# Patient Record
Sex: Female | Born: 1970
Health system: Southern US, Community
[De-identification: ages and names within clinical notes are randomized; demographics above are authoritative.]

## PROBLEM LIST (undated history)

## (undated) DIAGNOSIS — G43909 Migraine, unspecified, not intractable, without status migrainosus: Secondary | ICD-10-CM

## (undated) DIAGNOSIS — Z9889 Other specified postprocedural states: Secondary | ICD-10-CM

## (undated) DIAGNOSIS — K219 Gastro-esophageal reflux disease without esophagitis: Secondary | ICD-10-CM

## (undated) DIAGNOSIS — S43109A Unspecified dislocation of unspecified acromioclavicular joint, initial encounter: Secondary | ICD-10-CM

## (undated) DIAGNOSIS — T8859XA Other complications of anesthesia, initial encounter: Secondary | ICD-10-CM

## (undated) DIAGNOSIS — F419 Anxiety disorder, unspecified: Secondary | ICD-10-CM

## (undated) DIAGNOSIS — G5601 Carpal tunnel syndrome, right upper limb: Secondary | ICD-10-CM

## (undated) DIAGNOSIS — T4145XA Adverse effect of unspecified anesthetic, initial encounter: Secondary | ICD-10-CM

## (undated) DIAGNOSIS — G47419 Narcolepsy without cataplexy: Secondary | ICD-10-CM

## (undated) DIAGNOSIS — R112 Nausea with vomiting, unspecified: Secondary | ICD-10-CM

## (undated) DIAGNOSIS — Z98811 Dental restoration status: Secondary | ICD-10-CM

## (undated) HISTORY — PX: BUNIONECTOMY: SHX129

---

## 1998-05-08 ENCOUNTER — Emergency Department (HOSPITAL_COMMUNITY): Admission: EM | Admit: 1998-05-08 | Discharge: 1998-05-08 | Payer: Self-pay | Admitting: Emergency Medicine

## 1998-05-08 ENCOUNTER — Encounter: Payer: Self-pay | Admitting: Emergency Medicine

## 2002-03-31 ENCOUNTER — Ambulatory Visit (HOSPITAL_COMMUNITY): Admission: RE | Admit: 2002-03-31 | Discharge: 2002-03-31 | Payer: Self-pay | Admitting: Obstetrics and Gynecology

## 2002-03-31 ENCOUNTER — Encounter: Payer: Self-pay | Admitting: Obstetrics and Gynecology

## 2002-09-08 ENCOUNTER — Inpatient Hospital Stay (HOSPITAL_COMMUNITY): Admission: RE | Admit: 2002-09-08 | Discharge: 2002-09-08 | Payer: Self-pay | Admitting: Obstetrics and Gynecology

## 2002-09-08 ENCOUNTER — Encounter: Payer: Self-pay | Admitting: Obstetrics and Gynecology

## 2002-10-09 ENCOUNTER — Inpatient Hospital Stay (HOSPITAL_COMMUNITY): Admission: AD | Admit: 2002-10-09 | Discharge: 2002-10-09 | Payer: Self-pay | Admitting: Obstetrics and Gynecology

## 2002-10-31 ENCOUNTER — Inpatient Hospital Stay (HOSPITAL_COMMUNITY): Admission: AD | Admit: 2002-10-31 | Discharge: 2002-10-31 | Payer: Self-pay | Admitting: Obstetrics and Gynecology

## 2002-10-31 ENCOUNTER — Encounter: Payer: Self-pay | Admitting: Obstetrics and Gynecology

## 2002-11-14 ENCOUNTER — Ambulatory Visit (HOSPITAL_COMMUNITY): Admission: RE | Admit: 2002-11-14 | Discharge: 2002-11-14 | Payer: Self-pay | Admitting: Obstetrics and Gynecology

## 2002-11-14 ENCOUNTER — Encounter (INDEPENDENT_AMBULATORY_CARE_PROVIDER_SITE_OTHER): Payer: Self-pay

## 2002-11-14 HISTORY — PX: LAPAROSCOPIC OVARIAN CYSTECTOMY: SHX6248

## 2002-11-14 HISTORY — PX: ENDOMETRIAL ABLATION: SHX621

## 2003-04-03 ENCOUNTER — Other Ambulatory Visit: Admission: RE | Admit: 2003-04-03 | Discharge: 2003-04-03 | Payer: Self-pay | Admitting: Obstetrics and Gynecology

## 2004-09-16 ENCOUNTER — Ambulatory Visit: Payer: Self-pay | Admitting: Internal Medicine

## 2004-09-22 ENCOUNTER — Ambulatory Visit: Payer: Self-pay | Admitting: Internal Medicine

## 2004-11-01 ENCOUNTER — Other Ambulatory Visit: Admission: RE | Admit: 2004-11-01 | Discharge: 2004-11-01 | Payer: Self-pay | Admitting: Gynecology

## 2004-11-15 ENCOUNTER — Ambulatory Visit: Payer: Self-pay | Admitting: Internal Medicine

## 2004-11-19 ENCOUNTER — Emergency Department (HOSPITAL_COMMUNITY): Admission: EM | Admit: 2004-11-19 | Discharge: 2004-11-19 | Payer: Self-pay | Admitting: Family Medicine

## 2005-03-24 ENCOUNTER — Ambulatory Visit: Payer: Self-pay | Admitting: Internal Medicine

## 2005-10-12 ENCOUNTER — Inpatient Hospital Stay (HOSPITAL_COMMUNITY): Admission: AD | Admit: 2005-10-12 | Discharge: 2005-10-17 | Payer: Self-pay | Admitting: Obstetrics and Gynecology

## 2005-10-14 ENCOUNTER — Encounter (INDEPENDENT_AMBULATORY_CARE_PROVIDER_SITE_OTHER): Payer: Self-pay | Admitting: Specialist

## 2005-11-28 ENCOUNTER — Other Ambulatory Visit: Admission: RE | Admit: 2005-11-28 | Discharge: 2005-11-28 | Payer: Self-pay | Admitting: Obstetrics and Gynecology

## 2006-02-27 ENCOUNTER — Ambulatory Visit: Payer: Self-pay | Admitting: Internal Medicine

## 2006-06-28 ENCOUNTER — Ambulatory Visit: Payer: Self-pay | Admitting: Internal Medicine

## 2006-07-31 ENCOUNTER — Ambulatory Visit: Payer: Self-pay | Admitting: Internal Medicine

## 2006-09-25 ENCOUNTER — Ambulatory Visit: Payer: Self-pay | Admitting: Internal Medicine

## 2006-10-30 ENCOUNTER — Ambulatory Visit: Payer: Self-pay | Admitting: Internal Medicine

## 2007-07-24 ENCOUNTER — Ambulatory Visit: Payer: Self-pay | Admitting: Internal Medicine

## 2007-08-23 ENCOUNTER — Encounter (INDEPENDENT_AMBULATORY_CARE_PROVIDER_SITE_OTHER): Payer: Self-pay | Admitting: Obstetrics and Gynecology

## 2007-08-23 ENCOUNTER — Inpatient Hospital Stay (HOSPITAL_COMMUNITY): Admission: RE | Admit: 2007-08-23 | Discharge: 2007-08-26 | Payer: Self-pay | Admitting: Obstetrics and Gynecology

## 2007-08-23 HISTORY — PX: TUBAL LIGATION: SHX77

## 2007-11-04 ENCOUNTER — Ambulatory Visit: Payer: Self-pay | Admitting: Internal Medicine

## 2007-11-12 ENCOUNTER — Telehealth: Payer: Self-pay | Admitting: Internal Medicine

## 2007-12-09 ENCOUNTER — Ambulatory Visit: Payer: Self-pay | Admitting: Internal Medicine

## 2007-12-25 ENCOUNTER — Telehealth: Payer: Self-pay | Admitting: Internal Medicine

## 2008-04-03 ENCOUNTER — Ambulatory Visit: Payer: Self-pay | Admitting: Internal Medicine

## 2008-04-03 DIAGNOSIS — F341 Dysthymic disorder: Secondary | ICD-10-CM | POA: Insufficient documentation

## 2008-04-17 ENCOUNTER — Telehealth: Payer: Self-pay | Admitting: Internal Medicine

## 2008-09-11 ENCOUNTER — Ambulatory Visit: Payer: Self-pay | Admitting: Internal Medicine

## 2008-09-21 ENCOUNTER — Encounter: Payer: Self-pay | Admitting: Internal Medicine

## 2008-10-04 ENCOUNTER — Emergency Department (HOSPITAL_COMMUNITY): Admission: EM | Admit: 2008-10-04 | Discharge: 2008-10-04 | Payer: Self-pay | Admitting: Emergency Medicine

## 2008-10-06 ENCOUNTER — Ambulatory Visit: Payer: Self-pay | Admitting: Internal Medicine

## 2008-10-23 ENCOUNTER — Telehealth: Payer: Self-pay | Admitting: Internal Medicine

## 2008-10-26 ENCOUNTER — Telehealth: Payer: Self-pay | Admitting: Internal Medicine

## 2008-10-26 ENCOUNTER — Ambulatory Visit: Payer: Self-pay | Admitting: Internal Medicine

## 2008-10-27 ENCOUNTER — Ambulatory Visit: Payer: Self-pay | Admitting: Internal Medicine

## 2009-02-04 ENCOUNTER — Telehealth: Payer: Self-pay | Admitting: Internal Medicine

## 2009-03-10 ENCOUNTER — Telehealth: Payer: Self-pay | Admitting: Internal Medicine

## 2009-04-20 ENCOUNTER — Ambulatory Visit: Payer: Self-pay | Admitting: Family Medicine

## 2009-04-28 ENCOUNTER — Telehealth: Payer: Self-pay | Admitting: Family Medicine

## 2009-04-30 ENCOUNTER — Ambulatory Visit (HOSPITAL_COMMUNITY): Admission: RE | Admit: 2009-04-30 | Discharge: 2009-04-30 | Payer: Self-pay | Admitting: Obstetrics and Gynecology

## 2009-04-30 HISTORY — PX: LAPAROSCOPIC LYSIS OF ADHESIONS: SHX5905

## 2009-05-18 ENCOUNTER — Telehealth: Payer: Self-pay | Admitting: Internal Medicine

## 2009-05-24 ENCOUNTER — Ambulatory Visit: Payer: Self-pay | Admitting: Internal Medicine

## 2009-05-31 ENCOUNTER — Telehealth: Payer: Self-pay | Admitting: Internal Medicine

## 2009-06-04 ENCOUNTER — Telehealth: Payer: Self-pay | Admitting: Internal Medicine

## 2009-07-22 ENCOUNTER — Telehealth: Payer: Self-pay | Admitting: Internal Medicine

## 2009-07-26 ENCOUNTER — Ambulatory Visit (HOSPITAL_COMMUNITY): Admission: RE | Admit: 2009-07-26 | Discharge: 2009-07-27 | Payer: Self-pay | Admitting: Obstetrics and Gynecology

## 2009-07-26 HISTORY — PX: LAPAROSCOPIC UNILATERAL SALPINGO OOPHERECTOMY: SHX5935

## 2009-07-26 HISTORY — PX: CYSTOSCOPY: SHX5120

## 2009-07-26 HISTORY — PX: LAPAROSCOPIC SUPRACERVICAL HYSTERECTOMY: SUR797

## 2009-09-06 ENCOUNTER — Telehealth: Payer: Self-pay | Admitting: Internal Medicine

## 2009-10-20 ENCOUNTER — Telehealth: Payer: Self-pay | Admitting: Internal Medicine

## 2009-12-21 ENCOUNTER — Ambulatory Visit: Payer: Self-pay | Admitting: Family Medicine

## 2010-03-18 ENCOUNTER — Ambulatory Visit: Payer: Self-pay | Admitting: Internal Medicine

## 2010-03-18 DIAGNOSIS — R109 Unspecified abdominal pain: Secondary | ICD-10-CM | POA: Insufficient documentation

## 2010-03-21 LAB — CONVERTED CEMR LAB
ALT: 33 units/L (ref 0–35)
AST: 23 units/L (ref 0–37)
Albumin: 4.1 g/dL (ref 3.5–5.2)
Alkaline Phosphatase: 62 units/L (ref 39–117)
Amylase: 40 units/L (ref 27–131)
Basophils Absolute: 0 10*3/uL (ref 0.0–0.1)
Basophils Relative: 0.5 % (ref 0.0–3.0)
Bilirubin, Direct: 0.1 mg/dL (ref 0.0–0.3)
Eosinophils Absolute: 0.1 10*3/uL (ref 0.0–0.7)
Eosinophils Relative: 1 % (ref 0.0–5.0)
HCT: 34.3 % — ABNORMAL LOW (ref 36.0–46.0)
Hemoglobin: 11.8 g/dL — ABNORMAL LOW (ref 12.0–15.0)
Lymphocytes Relative: 34.5 % (ref 12.0–46.0)
Lymphs Abs: 1.8 10*3/uL (ref 0.7–4.0)
MCHC: 34.5 g/dL (ref 30.0–36.0)
MCV: 92.3 fL (ref 78.0–100.0)
Monocytes Absolute: 0.5 10*3/uL (ref 0.1–1.0)
Monocytes Relative: 9.2 % (ref 3.0–12.0)
Neutro Abs: 2.9 10*3/uL (ref 1.4–7.7)
Neutrophils Relative %: 54.8 % (ref 43.0–77.0)
Platelets: 191 10*3/uL (ref 150.0–400.0)
RBC: 3.71 M/uL — ABNORMAL LOW (ref 3.87–5.11)
RDW: 12.2 % (ref 11.5–14.6)
Total Bilirubin: 0.5 mg/dL (ref 0.3–1.2)
Total Protein: 7.5 g/dL (ref 6.0–8.3)
WBC: 5.3 10*3/uL (ref 4.5–10.5)

## 2010-03-22 ENCOUNTER — Telehealth: Payer: Self-pay | Admitting: Internal Medicine

## 2010-03-25 ENCOUNTER — Encounter: Admission: RE | Admit: 2010-03-25 | Discharge: 2010-03-25 | Payer: Self-pay | Admitting: Internal Medicine

## 2010-03-29 ENCOUNTER — Encounter: Payer: Self-pay | Admitting: Internal Medicine

## 2010-05-13 ENCOUNTER — Ambulatory Visit: Payer: Self-pay | Admitting: Internal Medicine

## 2010-05-13 DIAGNOSIS — K649 Unspecified hemorrhoids: Secondary | ICD-10-CM | POA: Insufficient documentation

## 2010-05-13 DIAGNOSIS — R1011 Right upper quadrant pain: Secondary | ICD-10-CM | POA: Insufficient documentation

## 2010-05-13 DIAGNOSIS — R110 Nausea: Secondary | ICD-10-CM | POA: Insufficient documentation

## 2010-05-30 ENCOUNTER — Telehealth: Payer: Self-pay | Admitting: Internal Medicine

## 2010-05-30 ENCOUNTER — Ambulatory Visit (HOSPITAL_COMMUNITY): Admission: RE | Admit: 2010-05-30 | Discharge: 2010-05-30 | Payer: Self-pay | Admitting: Internal Medicine

## 2010-05-31 ENCOUNTER — Encounter (INDEPENDENT_AMBULATORY_CARE_PROVIDER_SITE_OTHER): Payer: Self-pay | Admitting: *Deleted

## 2010-06-14 ENCOUNTER — Ambulatory Visit: Payer: Self-pay | Admitting: Internal Medicine

## 2010-09-13 NOTE — Procedures (Signed)
Summary: Upper Endoscopy  Patient: Cynthia [name] Note: All result statuses are Final unless otherwise noted.  Tests: (1) Upper Endoscopy (EGD)   EGD Upper Endoscopy       DONE     [facility]     [address]     [place], [place]  [address]           ENDOSCOPY PROCEDURE REPORT           PATIENT:  Cynthia, [name]  MR#:  [id number]     BIRTHDATE:  January 28, 1971, 38 yrs. old  GENDER:  female           ENDOSCOPIST:  Iva Boop, MD, [facility]     Referred by:          Birdie Sons, MD           PROCEDURE DATE:  06/14/2010     PROCEDURE:  EGD, diagnostic 43329     ASA CLASS:  Class I     INDICATIONS:  abdominal pain, right upper quad, nausea Gallbladder     work-up negative and she has not responded to PPI.     Hyoscyamine is helping but "makes me loopy"           MEDICATIONS:   Fentanyl 50 mcg IV, Versed 7 mg IV     TOPICAL ANESTHETIC:  Exactacain Spray           DESCRIPTION OF PROCEDURE:   After the risks benefits and     alternatives of the procedure were thoroughly explained, informed     consent was obtained.  The [facility] GIF-H180 [id number] endoscope was     introduced through the mouth and advanced to the second portion of     the duodenum, without limitations.  The instrument was slowly     withdrawn as the mucosa was fully examined.     <<PROCEDUREIMAGES>>           The upper, middle, and distal third of the esophagus were     carefully inspected and no abnormalities were noted. The z-line     was well seen at the GEJ. The endoscope was pushed into the fundus     which was normal including a retroflexed view. The antrum,gastric     body, first and second part of the duodenum were unremarkable.     Retroflexed views revealed no abnormalities.    The scope was then     withdrawn from the patient and the procedure completed.           COMPLICATIONS:  None           ENDOSCOPIC IMPRESSION:     1) Normal EGD     RECOMMENDATIONS:     1) Stop hyoscyamine     2) start  amitriptylline 25 mg, 1/2 tab at bedtime for 1 week     then 1 tab at bedtime     3) Call Dr. Marvell Fuller office this week to make an appointment to     be seen in 6 weeks           REPEAT EXAM:  In for as needed.           Iva Boop, MD, Clementeen Graham           CC:  The Patient     Lindley Magnus, MD           n.     Rosalie DoctorMaryjean Morn.  Gessner at 06/14/2010 05:24 PM           Sander Radon, [id number]  Note: An exclamation mark (!) indicates a result that was not dispersed into the flowsheet. Document Creation Date: 06/14/2010 5:25 PM _______________________________________________________________________  (1) Order result status: Final Collection or observation date-time: 06/14/2010 17:06 Requested date-time:  Receipt date-time:  Reported date-time:  Referring Physician:   Ordering Physician: Stan Head [phone]) Specimen Source:  Source: Launa Grill Order Number: [id number] Lab site:

## 2010-09-13 NOTE — Miscellaneous (Signed)
Summary: amitriptylline rx, stop Dexilant and hyoscyamine  Clinical Lists Changes  Medications: Removed medication of HYOSCYAMINE SULFATE CR 0.375 MG  TB12 (HYOSCYAMINE SULFATE) 1 by mouth two times a day Removed medication of DEXILANT 60 MG CPDR (DEXLANSOPRAZOLE) Take 1 capsule by mouth once a day Removed medication of HYOSCYAMINE SULFATE 0.125 MG SUBL (HYOSCYAMINE SULFATE) 1-2 as needed abdominal pain Added new medication of AMITRIPTYLINE HCL 25 MG TABS (AMITRIPTYLINE HCL) 1/2 tab at bedtime for 1 week then 1 tab by mouth at bedtime - Signed Rx of AMITRIPTYLINE HCL 25 MG TABS (AMITRIPTYLINE HCL) 1/2 tab at bedtime for 1 week then 1 tab by mouth at bedtime;  #30 x 1;  Signed;  Entered by: Iva Boop MD, Clementeen Graham;  Authorized by: Iva Boop MD, [facility];  Method used: Electronically to [facility]  [phone]*, [address] [place], [place], [place]  [address], Ph: [phone] or [phone], Fax: [phone]    Prescriptions: AMITRIPTYLINE HCL 25 MG TABS (AMITRIPTYLINE HCL) 1/2 tab at bedtime for 1 week then 1 tab by mouth at bedtime  #30 x 1   Entered and Authorized by:   Iva Boop MD, [facility]   Signed by:   Iva Boop MD, FACG on 06/14/2010   Method used:   Electronically to        [facility]  [phone]* (retail)       [address]       [place], [place]  [address]       Ph: [phone] or [phone]       Fax: [phone]   RxID:   [id number]

## 2010-09-13 NOTE — Assessment & Plan Note (Signed)
Summary: ABD PAIN/YF   History of Present Illness Visit Type: Initial Consult Primary GI MD: Stan Head MD [facility] Primary Provider: Birdie Sons, MD Requesting Provider: Birdie Sons, MD Chief Complaint: Starting 2 months ago pt has severe nausea after meals and RUQ under rib cage. Pt does have bleeding hemorrhiods sometimes without constipation or diarrhea. History of Present Illness:   [place] neg If she eats anything or even drinks coffee gets nauseous, and then gets pain in RUQ that radiates into back and infrascapular region. Never before. Nausea s constant. she took Nexium x almost 1 month without help. Gaviscon helsp some but does not stop it. Not sleeping well with these problems. Was on Solodyn x 1 year and about 1 month ago switched to Bactrim. Two young kids at home so busy and stressed. husband in school fulltime and working  Hemorrhoids since childbirth 4 yrs ago and has irregular defecation and intermttent bleeding bright red.   GI Review of Systems    Reports abdominal pain, acid reflux, belching, bloating, chest pain, and  nausea.     Location of  Abdominal pain: RUQ.    Denies dysphagia with liquids, dysphagia with solids, heartburn, loss of appetite, vomiting, vomiting blood, weight loss, and  weight gain.      Reports rectal bleeding.     Denies anal fissure, black tarry stools, change in bowel habit, constipation, diarrhea, diverticulosis, fecal incontinence, heme positive stool, hemorrhoids, irritable bowel syndrome, jaundice, light color stool, liver problems, and  rectal pain. Preventive Screening-Counseling & Management      Drug Use:  no.      Current Medications (verified): 1)  Tylenol 325 Mg  Tabs (Acetaminophen) .... As Needed 2)  Tylenol Pm Extra Strength 500-25 Mg Tabs (Diphenhydramine-Apap (Sleep)) .... Take 1 Tablet By Mouth At Bedtime 3)  Citalopram Hydrobromide 20 Mg Tabs (Citalopram Hydrobromide) .... Once Daily 4)  Xanax 0.25 Mg Tabs (Alprazolam)  .... One By Mouth Q O Day Not To Exceed 3 Per Week 5)  Hydrocodone-Acetaminophen 5-325 Mg Tabs (Hydrocodone-Acetaminophen) .Marland Kitchen.. 1 By Mouth Up To 4 Times Per Day As Needed For Pain 6)  Bactrim 400-80 Mg Tabs (Sulfamethoxazole-Trimethoprim) .... One Tablet By Mouth Once Daily  Allergies (verified): 1)  ! 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  The patient denies allergy/sinus, anemia, arthritis/joint pain, blood in urine, breast changes/lumps, change in vision, confusion, cough, coughing up blood, depression-new, fainting, fever, hearing problems, heart murmur, heart rhythm changes, itching, muscle pains/cramps, night sweats, nosebleeds, pregnancy symptoms, shortness of breath, skin rash, sleeping problems, sore throat, swelling of feet/legs, swollen lymph glands, thirst - excessive , urination - excessive , urination changes/pain, vision changes, and voice change.    Vital Signs:  Patient profile:   40 year old female Height:      61 inches Weight:      132.13 pounds Pulse rate:  78 / minute Pulse rhythm:   regular BP sitting:   106 / 74  (right arm) Cuff size:   regular  Vitals Entered By: Christie Nottingham CMA Duncan Dull) (May 13, 2010 1:42 PM)  Physical Exam  General:  Well developed, well nourished, no acute  distress. Eyes:   no icterus. Mouth:  No deformity or lesions, dentition normal. Neck:  Supple; no masses or thyromegaly. Lungs:  Clear throughout to auscultation. Heart:  Regular rate and rhythm; no murmurs, rubs,  or bruits. Abdomen:  mildly tender RUQ soft, no HSM, Mass BS+ Rectal:  female staff present small external tagm firm brown stoolm no mass, normal tone ANOSCOPY: hemorhoids with red spots in anal canal Extremities:  No clubbing, cyanosis, edema or deformities noted. Cervical Nodes:  No significant cervical or supraclavicular adenopathy.  Psych:  Alert and cooperative. Normal mood and affect.   Impression & Recommendations:  Problem # 1:  ABDOMINAL PAIN, RIGHT UPPER QUADRANT (ICD-789.01) Assessment New sounds like Gallbladder but [place] without stones (does not exclude)  Orders: HIDA CCK (HIDA CCK) ? Gallbladder dyskinesia  Problem # 2:  NAUSEA (ICD-787.02) Assessment: New constant not better with PPI try Dexilant now ? Solodyn effect - have seen in past but would suspect it would be gone as she has not taken that in 1 month  Orders: HIDA CCK (HIDA CCK)  Problem # 3:  HEMORRHOIDS, WITH BLEEDING (ICD-455.8) Assessment: New naucort HC as needed  Problem # 4:  ? of IRRITABLE BOWEL SYNDROME (ICD-564.1) Assessment: New  Patient Instructions: 1)  Please pick up your medications at your pharmacy. 2)  Your HIDA scan is scheduled at [facility].  Please see seperate instructions. 3)  Dexilant samples given.  Please call our office if this works. 4)  The medication list was reviewed and reconciled.  All changed / newly prescribed medications were explained.  A complete medication list was provided to the patient / caregiver. Prescriptions: HYOSCYAMINE SULFATE 0.125 MG SUBL (HYOSCYAMINE SULFATE) 1-2 as needed abdominal pain  #90 x 0   Entered and Authorized by:   Iva Boop MD, [facility]   Signed by:   Iva Boop MD, FACG on 05/13/2010   Method used:    Electronically to        [facility]  [phone]* (retail)       [address]       [place], [place]  [address]       Ph: [phone] or [phone]       Fax: [phone]   RxID:   [id number] ANUCORT-HC 25 MG SUPP (HYDROCORTISONE ACETATE) 1 per rectum as needed for rectal bleeding  #28 x 0   Entered and Authorized by:   Iva Boop MD, [facility]   Signed by:   Iva Boop MD, FACG on 05/13/2010   Method used:   Electronically to        [facility]  [phone]* (retail)       [address]       [place], [place]  [address]       Ph: [phone] or [phone]       Fax: [phone]   RxID:   [id number]

## 2010-09-13 NOTE — Progress Notes (Signed)
Summary: [place] results  Phone Note Call from Patient   Caller: Patient Call For: Birdie Sons MD Summary of Call: Pt is asking for GB Ultra Sound results. [phone] Initial call taken by: Lynann Beaver CMA,  March 22, 2010 9:15 AM  Follow-up for Phone Call        results not available as yet.  Will call when are ready. Left message on phone to inform pt. Follow-up by: Gladis Riffle, RN,  March 22, 2010 2:37 PM  Additional Follow-up for Phone Call Additional follow up Details #1::        scheduled 8/12 at 7:30.Left message on pt home phone. Additional Follow-up by: Gladis Riffle, RN,  March 23, 2010 10:13 AM

## 2010-09-13 NOTE — Progress Notes (Signed)
Summary: xanax request  Phone Note Call from Patient   Caller: Patient Call For: Birdie Sons MD Summary of Call: Pt is requesting Xanax for anxiety and multiple issues she is dealing with right now. Celexa is not helping a lot. [phone]  Initial call taken by: Lynann Beaver CMA,  September 06, 2009 3:37 PM  Follow-up for Phone Call        alprazolam 0.25 mg by mouth every other day as needed anxiety not to exceed 3 per week #12/1 Follow-up by: Birdie Sons MD,  September 06, 2009 3:51 PM    New/Updated Medications: XANAX 0.25 MG TABS (ALPRAZOLAM) one by mouth q o day not to exceed 3 per week Prescriptions: XANAX 0.25 MG TABS (ALPRAZOLAM) one by mouth q o day not to exceed 3 per week  #12 x 1   Entered by:   Lynann Beaver CMA   Authorized by:   Birdie Sons MD   Signed by:   Lynann Beaver CMA on 09/06/2009   Method used:   Telephoned to ...       [facility]  [address]  [phone]* (retail)       [address]       [place], [place]  [address]       Ph: [phone] or [phone]       Fax: [phone]   RxID:   [id number]

## 2010-09-13 NOTE — Assessment & Plan Note (Signed)
Summary: COUGH, CONGESTION, EAR PAIN // RS   Vital Signs:  Patient profile:   40 year old female Temp:     98 degrees F oral BP sitting:   98 / 70  (left arm) Cuff size:   regular  Vitals Entered By: Sid Falcon LPN (Dec 21, 2009 9:28 AM) CC: cough, congestion, sinus headache X 3 days   History of Present Illness: Patient is seen with one week history of frontal sinus headaches, bilateral ear pain, cough and thick yellow nasal discharge. Intermittent sore throat. No fever. Mild to moderate body aches. No dyspnea. Taken over-the-counter medications with minimal relief. Nonsmoker  Allergies: 1)  ! Morphine 2)  Periostat (Doxycycline Hyclate) 3)  Anaprox (Naproxen Sodium) 4)  Meclizine Hcl (Meclizine Hcl)  Past History:  Past Medical History: Last updated: 11/04/2007 Unremarkable  Social History: Last updated: 04/03/2008 Never Smoked maried 2 kids PMH reviewed for relevance, SH/Risk Factors reviewed for relevance  Review of Systems      See HPI  Physical Exam  General:  Well-developed,well-nourished,in no acute distress; alert,appropriate and cooperative throughout examination Ears:  External ear exam shows no significant lesions or deformities.  Otoscopic examination reveals clear canals, tympanic membranes are intact bilaterally without bulging, retraction, inflammation or discharge. Hearing is grossly normal bilaterally. Nose:  swollen nasal mucosa with mostly clear mucus Mouth:  Oral mucosa and oropharynx without lesions or exudates.  Teeth in good repair. Neck:  No deformities, masses, or tenderness noted. Lungs:  Normal respiratory effort, chest expands symmetrically. Lungs are clear to auscultation, no crackles or wheezes. Heart:  Normal rate and regular rhythm. S1 and S2 normal without gallop, murmur, click, rub or other extra sounds.   Impression & Recommendations:  Problem # 1:  URI (ICD-465.9) prob viral .   Pt is encouraged to wait and no antibiotics at  this time.  If colored nasal d/c and facial pain persist start Amoxicillin. Her updated medication list for this problem includes:    Tylenol 325 Mg Tabs (Acetaminophen) .Marland Kitchen... As needed  Complete Medication List: 1)  Tylenol 325 Mg Tabs (Acetaminophen) .... As needed 2)  Tylenol Pm Extra Strength 500-25 Mg Tabs (Diphenhydramine-apap (sleep)) .... Take 1 tablet by mouth at bedtime 3)  Citalopram Hydrobromide 20 Mg Tabs (Citalopram hydrobromide) .... Once daily 4)  Soldyn  .... Take 1 tablet by mouth at bedtime for face 5)  Amoxicillin 500 Mg Caps (Amoxicillin) .... One by mouth three times a day x 10 days 6)  Diflucan 150 Mg Tabs (Fluconazole) .... Take as a one time dose. 7)  Imipramine Hcl 10 Mg Tabs (Imipramine hcl) .... One by mouth q hs 8)  Xanax 0.25 Mg Tabs (Alprazolam) .... One by mouth q o day not to exceed 3 per week  Patient Instructions: 1)  Get plenty of rest, drink lots of clear liquids, and use Tylenol or Ibuprofen for fever and comfort. Return in 7-10 days if you're not better: sooner if you'er feeling worse.  2)  Consider starting antibiotics if facial pain and colored nasal discharge not improving over the next few days. Prescriptions: DIFLUCAN 150 MG TABS (FLUCONAZOLE) Take as a one time dose.  #1 x 0   Entered and Authorized by:   Evelena Peat MD   Signed by:   Evelena Peat MD on 12/21/2009   Method used:   Print then Give to Patient   RxID:   [id number] AMOXICILLIN 500 MG CAPS (AMOXICILLIN) one by mouth three times a day x 10  days  #30 x 0   Entered and Authorized by:   Evelena Peat MD   Signed by:   Evelena Peat MD on 12/21/2009   Method used:   Print then Give to Patient   RxID:   [id number]

## 2010-09-13 NOTE — Letter (Signed)
Summary: EGD Instructions  [facility]  [address] [place], [place] [address]   Phone: [phone]  Fax: [phone]       STEPHANY POORMAN    [birth year]    MRN: [id number]       Procedure Day Dorna Bloom: Jake Shark, 06/14/10     Arrival Time: 3:00 PM     Procedure Time: 4:00 PM     Location of Procedure:                    _X_  [facility] Center (4th Floor)  PREPARATION FOR ENDOSCOPY   On TUESDAY, 06/14/10, THE DAY OF THE PROCEDURE:  1.   No solid foods, milk or milk products are allowed after midnight the night before your procedure.  2.   Do not drink anything colored red or purple.  Avoid juices with pulp.  No orange juice.  3.  You may drink clear liquids until 2:00 PM, which is 2 hours before your procedure.                                                                                                CLEAR LIQUIDS INCLUDE: Water Jello Ice Popsicles Tea (sugar ok, no milk/cream) Powdered fruit flavored drinks Coffee (sugar ok, no milk/cream) Gatorade Juice: apple, white grape, white cranberry  Lemonade Clear bullion, consomm, broth Carbonated beverages (any kind) Strained chicken noodle soup Hard Candy   MEDICATION INSTRUCTIONS  Unless otherwise instructed, you should take regular prescription medications with a small sip of water as early as possible the morning of your procedure.                  OTHER INSTRUCTIONS  You will need a responsible adult at least 38 years of age to accompany you and drive you home.   This person must remain in the waiting room during your procedure.  Wear loose fitting clothing that is easily removed.  Leave jewelry and other valuables at home.  However, you may wish to bring a book to read or an iPod/MP3 player to listen to music as you wait for your procedure to start.  Remove all body piercing jewelry and leave at home.  Total time from sign-in until discharge is approximately 2-3 hours.  You  should go home directly after your procedure and rest.  You can resume normal activities the day after your procedure.  The day of your procedure you should not:   Drive   Make legal decisions   Operate machinery   Drink alcohol   Return to work  You will receive specific instructions about eating, activities and medications before you leave.    The above instructions have been reviewed and explained to me by   Francee Piccolo, CMA (AAMA)     I fully understand and can verbalize these instructions _____________________________ Date 05/31/10

## 2010-09-13 NOTE — Progress Notes (Signed)
Summary: Normal HIDA and sx reassessment  Phone Note Outgoing Call Call back at [facility] Phone [phone]   Summary of Call: HIDA is ok how is she? Iva Boop MD, [facility]  May 30, 2010 3:00 PM   Follow-up for Phone Call        very nauseated all the time, still having pain under right ribs.  Took last of Dexilant, but this was not helping.  Hyoscyamine helps pain.  Nausea is more prevalent than pain.  Pt is controlling nausea with food intake (or lack there of).  Pt mentioned that you thought next step would be EGD.  Please advise. Francee Piccolo CMA Duncan Dull)  May 30, 2010 4:56 PM   Additional Follow-up for Phone Call Additional follow up Details #1::        schedule EGD to eval the abd pain I also rxed hyoscyamine 0.375 mg two times a day (long acting) could still take 0.125 mg as needed occasionally if the hyoscyamine fixes everything she should call back before EGD as may not need Additional Follow-up by: Iva Boop MD, Clementeen Graham,  May 31, 2010 8:23 AM    Additional Follow-up for Phone Call Additional follow up Details #2::    Appt for EGD shceduled.  Pt will come by tomorrow for paperwork.  She will pick up new rx at pharmacy. Follow-up by: Francee Piccolo CMA Duncan Dull),  May 31, 2010 8:46 AM  New/Updated Medications: HYOSCYAMINE SULFATE CR 0.375 MG  TB12 (HYOSCYAMINE SULFATE) 1 by mouth two times a day Prescriptions: HYOSCYAMINE SULFATE CR 0.375 MG  TB12 (HYOSCYAMINE SULFATE) 1 by mouth two times a day  #60 x 1   Entered and Authorized by:   Iva Boop MD, [facility]   Signed by:   Iva Boop MD, FACG on 05/31/2010   Method used:   Electronically to        [facility]  [phone]* (retail)       [address]       [place], [place]  [address]       Ph: [phone] or [phone]       Fax: [phone]   RxID:   [id number]

## 2010-09-13 NOTE — Progress Notes (Signed)
Summary: celexa & xanax refills  Phone Note Call from Patient Call back at Home Phone [phone]   Reason for Call: Refill Medication Summary of Call: Celexa & Xanax.  [facility].  Meds say doctor has to authorize. Initial call taken by: Rudy Jew, RN,  October 20, 2009 3:15 PM  Follow-up for Phone Call        can refill Follow-up by: Birdie Sons MD,  October 21, 2009 3:45 PM    Prescriptions: CITALOPRAM HYDROBROMIDE 20 MG TABS (CITALOPRAM HYDROBROMIDE) once daily  #90 x 3   Entered by:   Lynann Beaver CMA   Authorized by:   Gladis Riffle, RN   Signed by:   Lynann Beaver CMA on 10/22/2009   Method used:   Telephoned to ...       [facility]  [address]  [phone]* (retail)       [address]       [place], [place]  [address]       Ph: [phone] or [phone]       Fax: [phone]   RxID:   [id number] XANAX 0.25 MG TABS (ALPRAZOLAM) one by mouth q o day not to exceed 3 per week  #12 x 1   Entered by:   Lynann Beaver CMA   Authorized by:   Gladis Riffle, RN   Signed by:   Lynann Beaver CMA on 10/22/2009   Method used:   Telephoned to ...       [facility]  [address]  [phone]* (retail)       [address]       [place], [place]  [address]       Ph: [phone] or [phone]       Fax: [phone]   RxID:   [id number]

## 2010-09-13 NOTE — Assessment & Plan Note (Signed)
Summary: consult re: nausea when eating and pain under rib cage/cjr   Vital Signs:  Patient profile:   40 year old female Height:      61 inches Weight:      126 pounds BMI:     23.89 Temp:     98.2 degrees F oral Pulse rate:   72 / minute Resp:     14 per minute BP sitting:   100 / 90  (left arm)  Vitals Entered By: Willy Eddy, LPN (March 18, 2010 12:45 PM) CC: c/o pain under rt ribs radiating around to back with mnausea, gas and much indigestion   CC:  c/o pain under rt ribs radiating around to back with mnausea and gas and much indigestion.  History of Present Illness: several month hx of progressive postprandial RUQ pain that can radiate to back.  pain is intermittent but can be severe associated with nausea, no emesis Admits to worsening heartburn  All other systems reviewed and were negative   Preventive Screening-Counseling & Management  Alcohol-Tobacco     Alcohol drinks/day: 0     Smoking Status: quit > 6 months     Year Quit: 2006  Current Problems (verified): 1)  Dysthymic Disorder  (ICD-300.4)  Current Medications (verified): 1)  Tylenol 325 Mg  Tabs (Acetaminophen) .... As Needed 2)  Tylenol Pm Extra Strength 500-25 Mg Tabs (Diphenhydramine-Apap (Sleep)) .... Take 1 Tablet By Mouth At Bedtime 3)  Citalopram Hydrobromide 20 Mg Tabs (Citalopram Hydrobromide) .... Once Daily 4)  Soldyn .... Take 1 Tablet By Mouth At Bedtime For Face 5)  Xanax 0.25 Mg Tabs (Alprazolam) .... One By Mouth Q O Day Not To Exceed 3 Per Week  Allergies (verified): 1)  ! Morphine 2)  Periostat (Doxycycline Hyclate) 3)  Anaprox (Naproxen Sodium) 4)  Meclizine Hcl (Meclizine Hcl)  Past History:  Past Medical History: Last updated: 11/04/2007 Unremarkable  Past Surgical History: Last updated: 06/28/2006 laparoscopy bunionectomy  Family History: Last updated: 04/03/2008 hx of depression  Social History: Last updated: 04/03/2008 Never Smoked maried 2  kids  Risk Factors: Alcohol Use: 0 (03/18/2010) Caffeine Use: 1 (10/26/2008) Exercise: no (10/26/2008)  Risk Factors: Smoking Status: quit > 6 months (03/18/2010)  Physical Exam  General:  alert and well-developed.   Head:  normocephalic and atraumatic.   Eyes:  pupils equal and pupils round.   Ears:  R ear normal and L ear normal.   Neck:  No deformities, masses, or tenderness noted. Lungs:  Normal respiratory effort, chest expands symmetrically. Lungs are clear to auscultation, no crackles or wheezes. Heart:  normal rate and regular rhythm.   Abdomen:  soft, normal bowel sounds, no distention, no masses, and no guarding.   Neurologic:  cranial nerves II-XII intact and gait normal.     Impression & Recommendations:  Problem # 1:  ABDOMINAL PAIN (ICD-789.00) unclear etiology. Biliary colic is high on the list. Needs aggressive further evaluation. Check laboratory work and ultrasound. We will be in touch with patient after results. Orders: Venipuncture ([address]) Radiology Referral (Radiology) TLB-Hepatic/Liver Function Pnl (80076-HEPATIC) TLB-CBC Platelet - w/Differential (85025-CBCD) TLB-Amylase (82150-AMYL)  Complete Medication List: 1)  Tylenol 325 Mg Tabs (Acetaminophen) .... As needed 2)  Tylenol Pm Extra Strength 500-25 Mg Tabs (Diphenhydramine-apap (sleep)) .... Take 1 tablet by mouth at bedtime 3)  Citalopram Hydrobromide 20 Mg Tabs (Citalopram hydrobromide) .... Once daily 4)  Soldyn  .... Take 1 tablet by mouth at bedtime for face 5)  Xanax 0.25  Mg Tabs (Alprazolam) .... One by mouth q o day not to exceed 3 per week 6)  Hydrocodone-acetaminophen 5-325 Mg Tabs (Hydrocodone-acetaminophen) .Marland Kitchen.. 1 by mouth up to 4 times per day as needed for pain Prescriptions: HYDROCODONE-ACETAMINOPHEN 5-325 MG TABS (HYDROCODONE-ACETAMINOPHEN) 1 by mouth up to 4 times per day as needed for pain  #20 x 1   Entered and Authorized by:   Birdie Sons MD   Signed by:   Birdie Sons MD on  03/18/2010   Method used:   Print then Give to Patient   RxID:   [id number]

## 2010-09-13 NOTE — Letter (Signed)
Summary: New Patient letter  [facility]  [address] [place], [place] [address]   Phone: [phone]  Fax: [phone]       03/29/2010 MRN: [id number]  [facility] [address] [place], [place]  [address]  Dear Ms. Plaza,  Welcome to the Gastroenterology Division at [facility].    You are scheduled to see Dr.  Leone Payor on 05-13-10 at 1:45pm on the 3rd floor at [facility], [address]  We ask that you try to arrive at our office 15 minutes prior to your appointment time to allow for check-in.  We would like you to complete the enclosed self-administered evaluation form prior to your visit and bring it with you on the day of your appointment.  We will review it with you.  Also, please bring a complete list of all your medications or, if you prefer, bring the medication bottles and we will list them.  Please bring your insurance card so that we may make a copy of it.  If your insurance requires a referral to see a specialist, please bring your referral form from your primary care physician.  Co-payments are due at the time of your visit and may be paid by cash, check or credit card.     Your office visit will consist of a consult with your physician (includes a physical exam), any laboratory testing he/she may order, scheduling of any necessary diagnostic testing (e.g. x-ray, ultrasound, CT-scan), and scheduling of a procedure (e.g. Endoscopy, Colonoscopy) if required.  Please allow enough time on your schedule to allow for any/all of these possibilities.    If you cannot keep your appointment, please call [phone] to cancel or reschedule prior to your appointment date.  This allows [place] the opportunity to schedule an appointment for another patient in need of care.  If you do not cancel or reschedule by 5 p.m. the business day prior to your appointment date, you will be charged a $50.00 late cancellation/no-show fee.    Thank you for choosing  [facility] for your medical needs.  We appreciate the opportunity to care for you.  Please visit [place] at our website  to learn more about our practice.                     Sincerely,                                                             The Gastroenterology Division

## 2010-09-17 IMAGING — CT CT HEAD W/O CM
1 series · 16 of 30 positions shown, 20 images · non-contrast
Comparison: None

CLINICAL DATA: Recent trauma to the head.  Headache and dizziness.

CT HEAD WITHOUT CONTRAST
TECHNIQUE: Contiguous axial images were obtained from the base of
the skull through the vertex without contrast.

[Series 2: head_seq 4.5 h37s st · axial · 0.43mm/px · z∈[-136,-10]mm · 16 of 32 slices shown, 20 images]
[im 2/32  brain]
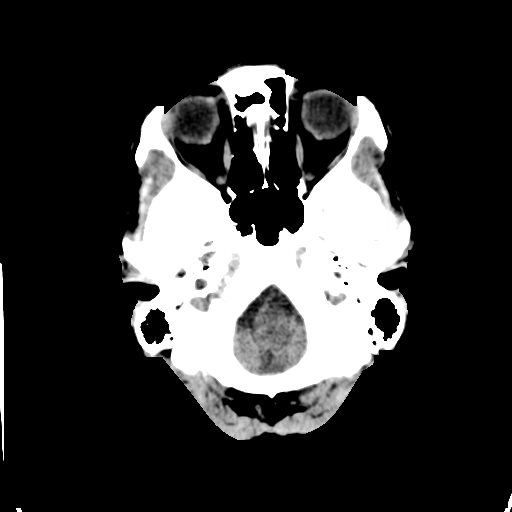
[im 2/32  bone]
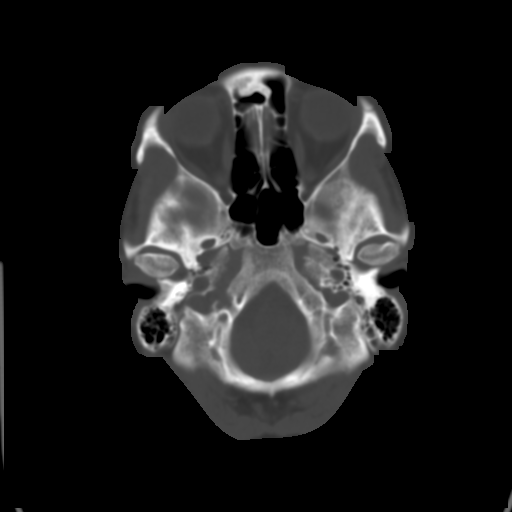
[im 4/32  brain]
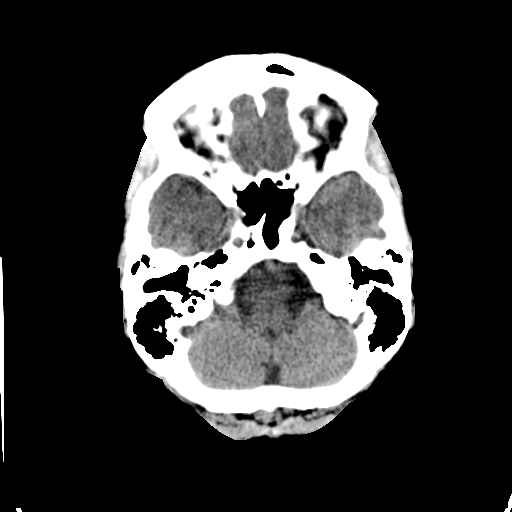
[im 6/32  brain]
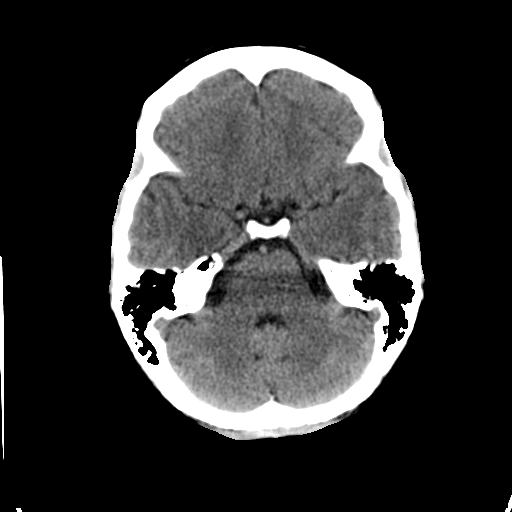
[im 8/32  brain]
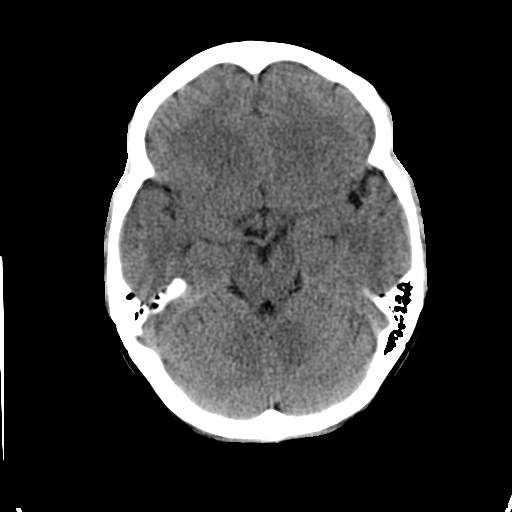
[im 9/32  brain]
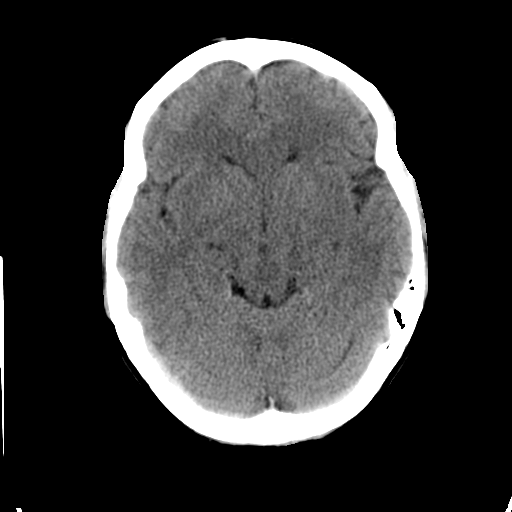
[im 9/32  bone]
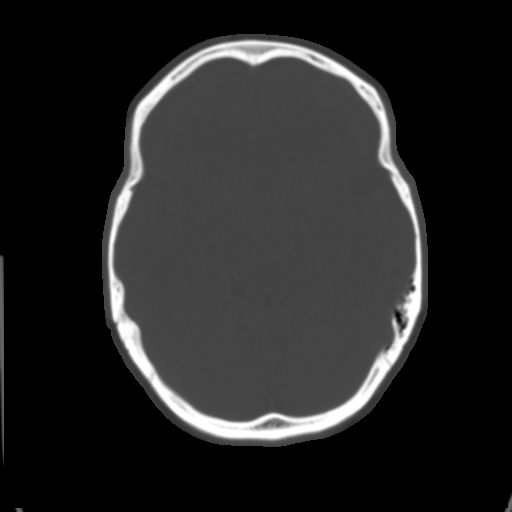
[im 11/32  brain]
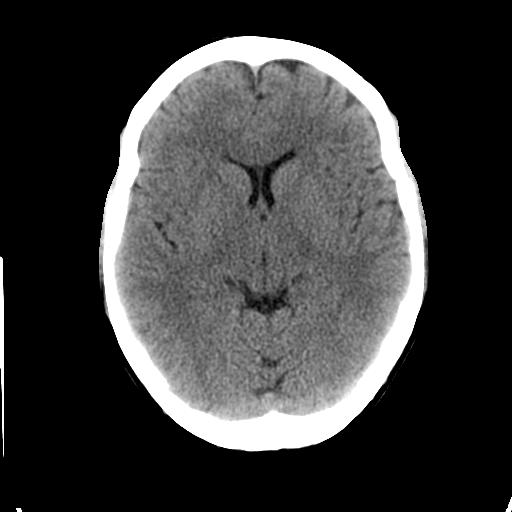
[im 13/32  brain]
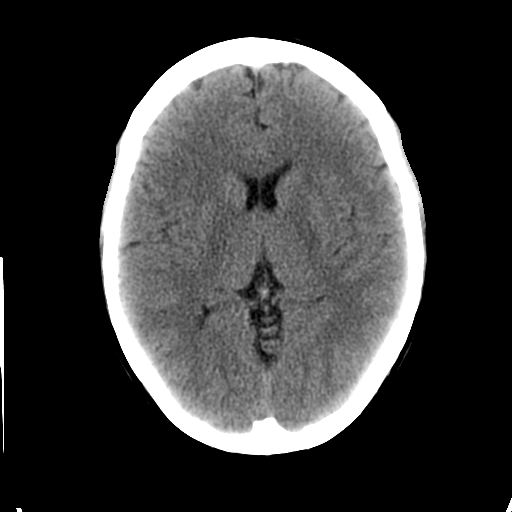
[im 15/32  brain]
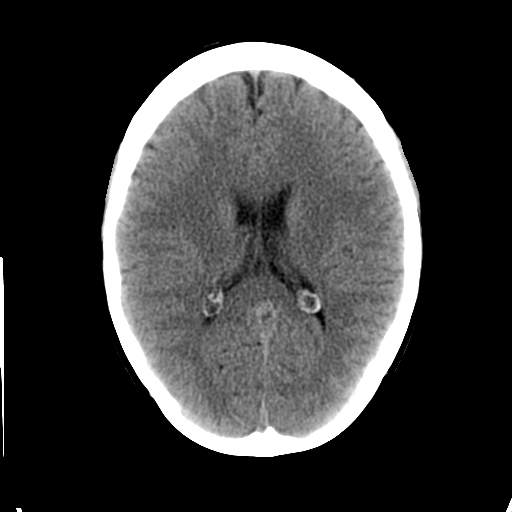
[im 17/32  brain]
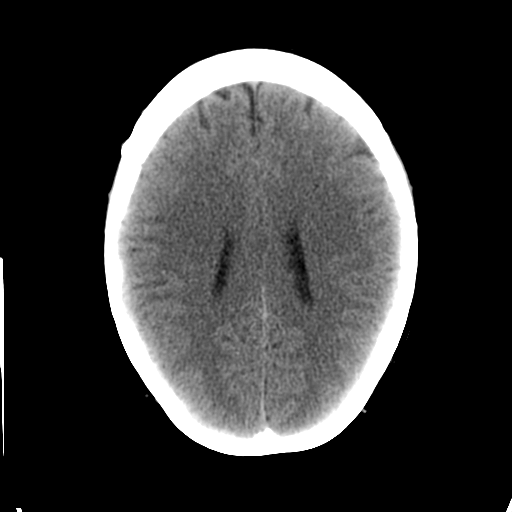
[im 17/32  bone]
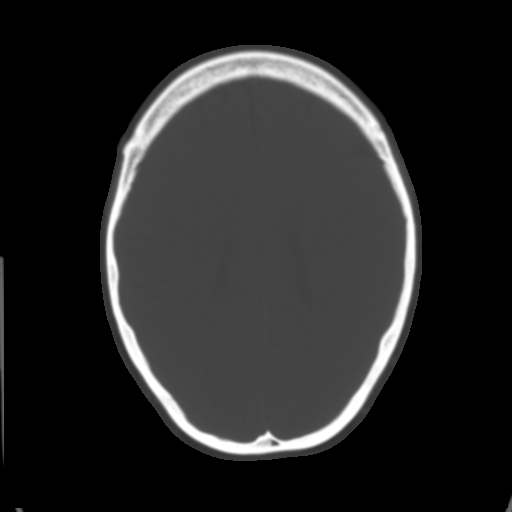
[im 19/32  brain]
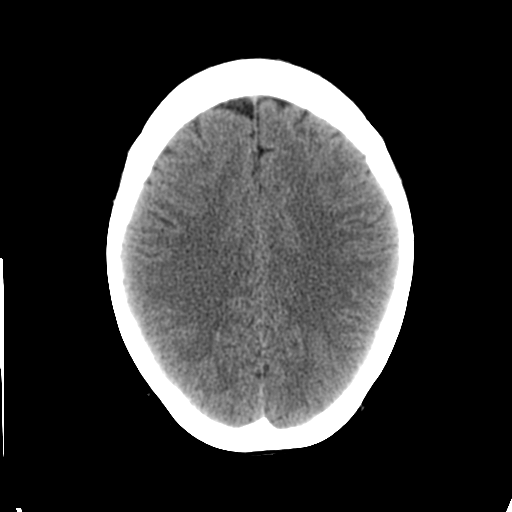
[im 21/32  brain]
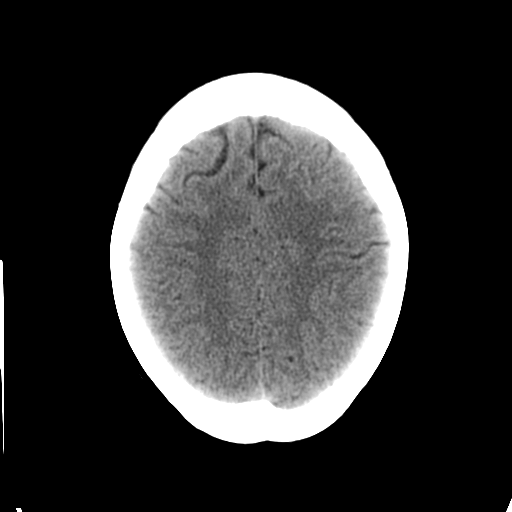
[im 23/32  brain]
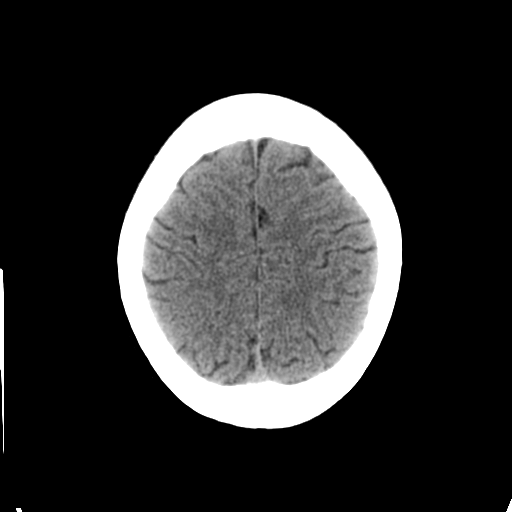
[im 24/32  brain]
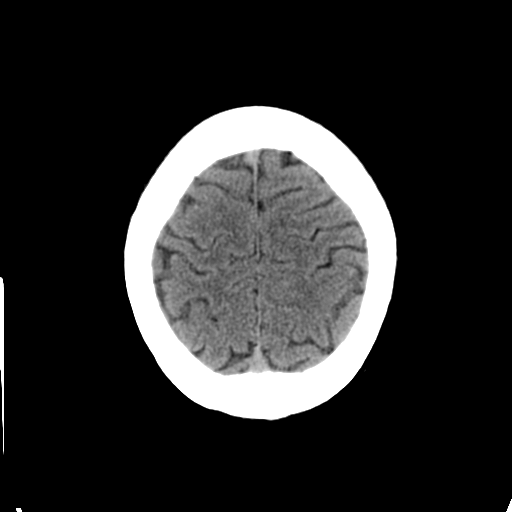
[im 24/32  bone]
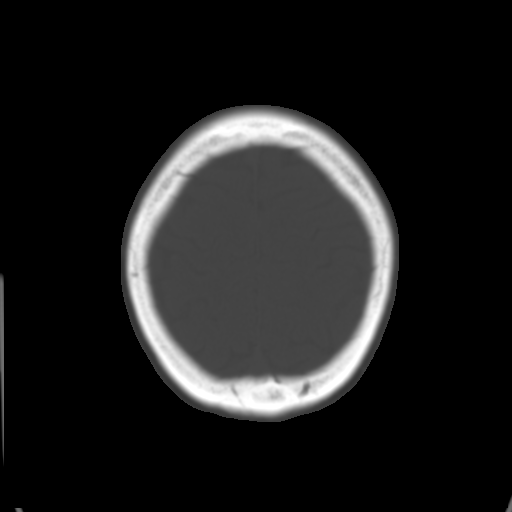
[im 26/32  brain]
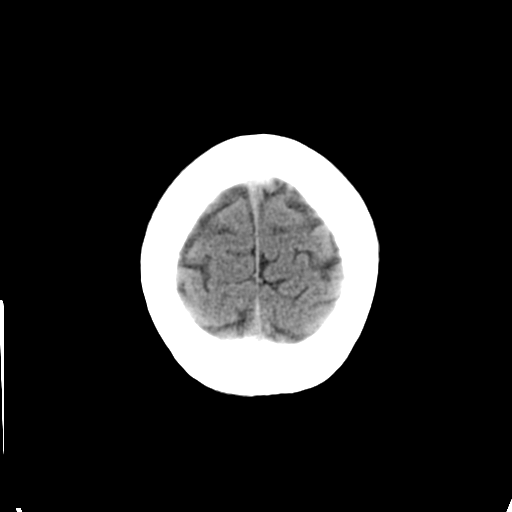
[im 28/32  brain]
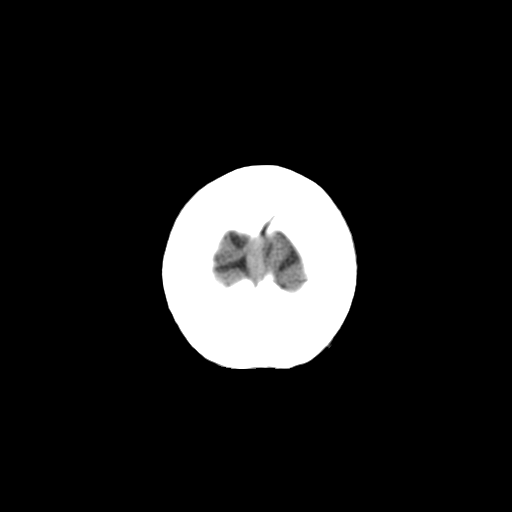
[im 30/32  brain]
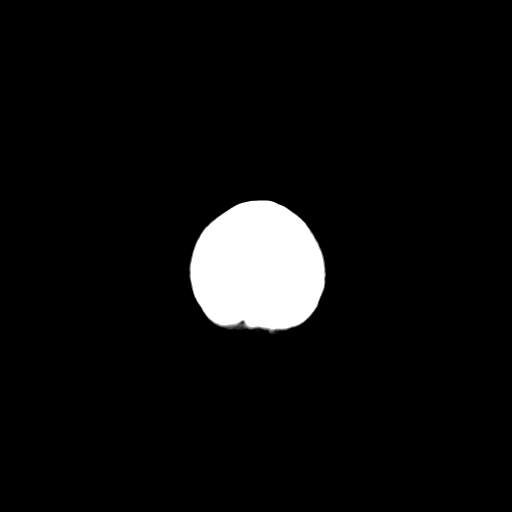

[16 of 30 positions shown; findings below may reference images not displayed]

FINDINGS: The brain has a normal appearance without evidence of
atrophy, chronic or acute infarction, mass lesion, hemorrhage,
hydrocephalus or extra-axial collection.  No skull fracture.  No
fluid in the visualized paranasal sinuses, middle ears or mastoids.
IMPRESSION: Normal examination

## 2010-11-07 ENCOUNTER — Encounter: Payer: Self-pay | Admitting: Internal Medicine

## 2010-11-08 ENCOUNTER — Other Ambulatory Visit: Payer: Self-pay | Admitting: Internal Medicine

## 2010-11-08 ENCOUNTER — Ambulatory Visit (INDEPENDENT_AMBULATORY_CARE_PROVIDER_SITE_OTHER): Payer: 59 | Admitting: Internal Medicine

## 2010-11-08 ENCOUNTER — Encounter: Payer: Self-pay | Admitting: Internal Medicine

## 2010-11-08 VITALS — BP 100/70 | Temp 98.9°F | Wt 140.0 lb

## 2010-11-08 DIAGNOSIS — R5381 Other malaise: Secondary | ICD-10-CM

## 2010-11-08 DIAGNOSIS — R51 Headache: Secondary | ICD-10-CM

## 2010-11-08 DIAGNOSIS — R5383 Other fatigue: Secondary | ICD-10-CM

## 2010-11-08 MED ORDER — HYDROCODONE-ACETAMINOPHEN 5-325 MG PO TABS: 1.0000 | ORAL_TABLET | Freq: Four times a day (QID) | ORAL | Status: DC | PRN

## 2010-11-08 MED ORDER — NORTRIPTYLINE HCL 10 MG PO CAPS: 10.0000 mg | ORAL_CAPSULE | Freq: Every day | ORAL | Status: DC

## 2010-11-08 NOTE — Progress Notes (Signed)
  Subjective:    Patient ID: Cynthia [name], female    DOB: 1971-02-08, 40 y.o.   MRN: [id number]  HPI   40 year old patient who presents with a chief complaint of chronic daily headaches for the past 3 weeks. She states she had headaches last year and did well for over 6 months. She had been on imipramine in the past and more recently amitriptyline. She no longer takes the amitriptyline. There has been some fatigue and weight gain. She does have a history of depression and has been on Celexa. She is using Tylenol and ibuprofen for headache relief. Headaches are always on the left side. She states she wakes feeling well but usually gets the headaches in the morning that lasts throughout the day there is no photophobia and only mild nausea. No family history of migraine. She states these headaches did quite well with imipramine in the past but this was discontinued due to oversedation she had a head CT done in March of 2010. She has been considered for a neurology referral but this was canceled apparently because of clinical improvement.    Review of Systems  Constitutional: Negative.   HENT: Negative for hearing loss, congestion, sore throat, rhinorrhea, dental problem, sinus pressure and tinnitus.   Eyes: Negative for pain, discharge and visual disturbance.  Respiratory: Negative for cough and shortness of breath.   Cardiovascular: Negative for chest pain, palpitations and leg swelling.  Gastrointestinal: Negative for nausea, vomiting, abdominal pain, diarrhea, constipation, blood in stool and abdominal distention.  Genitourinary: Negative for dysuria, urgency, frequency, hematuria, flank pain, vaginal bleeding, vaginal discharge, difficulty urinating, vaginal pain and pelvic pain.  Musculoskeletal: Negative for joint swelling, arthralgias and gait problem.  Skin: Negative for rash.  Neurological: Positive for headaches. Negative for dizziness, syncope, speech difficulty, weakness and numbness.    Hematological: Negative for adenopathy.  Psychiatric/Behavioral: Negative for behavioral problems, dysphoric mood and agitation. The patient is not nervous/anxious.        Objective:   Physical Exam  Constitutional: She is oriented to person, place, and time. She appears well-developed and well-nourished.  HENT:  Head: Normocephalic.  Right Ear: External ear normal.  Left Ear: External ear normal.  Mouth/Throat: Oropharynx is clear and moist.  Eyes: Conjunctivae and EOM are normal. Pupils are equal, round, and reactive to light.  Neck: Normal range of motion. Neck supple. No thyromegaly present.  Cardiovascular: Normal rate, regular rhythm, normal heart sounds and intact distal pulses.   Pulmonary/Chest: Effort normal and breath sounds normal.  Abdominal: Soft. Bowel sounds are normal. She exhibits no mass. There is no tenderness.  Musculoskeletal: Normal range of motion.  Lymphadenopathy:    She has no cervical adenopathy.  Neurological: She is alert and oriented to person, place, and time.  Skin: Skin is warm and dry. No rash noted.  Psychiatric: She has a normal mood and affect. Her behavior is normal.          Assessment & Plan:   headache syndrome. We'll try low-dose nortriptyline at bedtime. This should be better tolerated than amitriptyline. She is on a SSRI so we'll start with a low dose. We'll try a different analgesic  Rather than the Advil. Her hydrocodone refill. Will recheck in one month

## 2010-11-08 NOTE — Patient Instructions (Signed)
Return in one month for follow-up    It is important that you exercise regularly, at least 20 minutes 3 to 4 times per week.  If you develop chest pain or shortness of breath seek  medical attention.

## 2010-11-09 LAB — CBC WITH DIFFERENTIAL/PLATELET
Basophils Absolute: 0 10*3/uL (ref 0.0–0.1)
Eosinophils Absolute: 0.1 10*3/uL (ref 0.0–0.7)
Lymphocytes Relative: 38.7 % (ref 12.0–46.0)
MCHC: 34.8 g/dL (ref 30.0–36.0)
MCV: 95.9 fl (ref 78.0–100.0)
Monocytes Absolute: 0.2 10*3/uL (ref 0.1–1.0)
Neutrophils Relative %: 54.1 % (ref 43.0–77.0)
Platelets: 203 10*3/uL (ref 150.0–400.0)
RBC: 3.52 Mil/uL — ABNORMAL LOW (ref 3.87–5.11)

## 2010-11-15 LAB — CBC
Hemoglobin: 12 g/dL (ref 12.0–15.0)
Hemoglobin: 9.5 g/dL — ABNORMAL LOW (ref 12.0–15.0)
MCHC: 33.5 g/dL (ref 30.0–36.0)
RBC: 3.06 MIL/uL — ABNORMAL LOW (ref 3.87–5.11)
RBC: 3.9 MIL/uL (ref 3.87–5.11)
WBC: 3.8 10*3/uL — ABNORMAL LOW (ref 4.0–10.5)
WBC: 7.3 10*3/uL (ref 4.0–10.5)

## 2010-11-15 LAB — TYPE AND SCREEN
ABO/RH(D): A POS
Antibody Screen: NEGATIVE

## 2010-11-18 LAB — CBC
HCT: 35.7 % — ABNORMAL LOW (ref 36.0–46.0)
Hemoglobin: 11.9 g/dL — ABNORMAL LOW (ref 12.0–15.0)
MCHC: 33.5 g/dL (ref 30.0–36.0)
Platelets: 244 10*3/uL (ref 150–400)
RDW: 13 % (ref 11.5–15.5)

## 2010-12-20 ENCOUNTER — Other Ambulatory Visit: Payer: Self-pay | Admitting: Internal Medicine

## 2010-12-23 ENCOUNTER — Other Ambulatory Visit: Payer: Self-pay | Admitting: *Deleted

## 2010-12-23 NOTE — Telephone Encounter (Signed)
Can increase alprazolam to 0.5 mg po qod prn anxiety #10/1 refill Ok to refill citalopram

## 2010-12-23 NOTE — Telephone Encounter (Signed)
Pt is wanting to increase her xanax.  She and her husband are separating and can't handle things right now.  Also needs a refill on citalopram

## 2010-12-26 ENCOUNTER — Other Ambulatory Visit: Payer: Self-pay | Admitting: Internal Medicine

## 2010-12-26 ENCOUNTER — Other Ambulatory Visit: Payer: Self-pay | Admitting: *Deleted

## 2010-12-26 MED ORDER — CITALOPRAM HYDROBROMIDE 20 MG PO TABS: 20.0000 mg | ORAL_TABLET | Freq: Every day | ORAL | Status: DC

## 2010-12-26 MED ORDER — ALPRAZOLAM 0.5 MG PO TABS: 0.5000 mg | ORAL_TABLET | ORAL | Status: DC

## 2010-12-26 MED ORDER — ALPRAZOLAM 0.25 MG PO TABS: 0.2500 mg | ORAL_TABLET | ORAL | Status: DC

## 2010-12-26 NOTE — Telephone Encounter (Signed)
Dr. Swords pt 

## 2010-12-26 NOTE — Telephone Encounter (Signed)
rx called in, pt aware 

## 2010-12-27 NOTE — Telephone Encounter (Signed)
Dr Kirtland Bouchard prescribed this for headaches.  Is this ok to refill?

## 2010-12-27 NOTE — Op Note (Signed)
Cynthia [name], Cynthia [name]               ACCOUNT NO.:  [id number]   MEDICAL RECORD NO.:  [id number]           PATIENT TYPE:   LOCATION:                                FACILITY:  [facility]   PHYSICIAN:  Huel Cote, M.D. DATE OF BIRTH:  01/01/1971   DATE OF PROCEDURE:  08/23/2007  DATE OF DISCHARGE:                               OPERATIVE REPORT   PREOPERATIVE DIAGNOSES:  1. The patient is a 40 year old female with a preop diagnosis of      pregnancy at 39 weeks of gestation.  2. Repeat C-section planned, declined trial of labor.  3. Desires permanent sterility.   POSTOPERATIVE DIAGNOSES:  1. The patient is a 40 year old female with a preop diagnosis of      pregnancy at 39 weeks of gestation.  2. Repeat C-section planned, declined trial of labor.  3. Desires permanent sterility.   PROCEDURE:  Repeat low transverse C-section and bilateral tubal  ligation.   SURGEON:  Huel Cote, M.D.   ASSISTANT:  Zenaida Niece, M.D.   ANESTHESIA:  Spinal.   FINDINGS:  There was a vigorous female infant in vertex presentation.  Apgars were 9 and 9 and weight was 9 pounds 3 ounces.  Normal uterus,  tubes and ovaries were noted.   SPECIMEN:  Delivered was a placenta and sent to L and D after cord blood  collection.   ESTIMATED BLOOD LOSS:  Twelve hundred mL.   URINE OUTPUT:  One hundred and 75 mL clear urine.   IV FLUIDS:  Four liters.   PROCEDURE IN DETAIL:  The patient was taken to the operating room where  spinal anesthesia was obtained without difficulty.  She was then prepped  and draped in a normal sterile fashion in dorsal supine position with a  leftward tilt.  With a Foley catheter in place a skin incision was made  through a preexisting scar and carried down to the underlying layer of  fascia by sharp dissection and Bovie cautery.  The fascia was then  opened in the midline and the incision was extended laterally with Mayo  scissors.  The inferior aspect was then grasped  with Kocher clamps,  elevated and dissected off the underlying rectus muscles.  The superior  aspect was also elevated off the rectus muscles.  The rectus muscles  were separated in the midline and the peritoneal cavity entered bluntly.  The peritoneal incision was then extended both superiorly and inferiorly  with careful attention to avoid both bowel and bladder.  The Alexis self-  retaining wound retractor was then placed within the incision and the  lower uterine segment exposed nicely.  The lower uterine segment was  then incised in a transverse fashion and the cavity itself entered  bluntly.  The amniotic bag was intact and was ruptured with an Allis  clamp with clear fluid noted.  The infant's head was then delivered  atraumatically.  The nose and mouth were bulb suctioned and the  remainder of the infant's body delivered without difficulty.  The infant  was handed off to awaiting pediatrician and the placenta was  delivered,  was expressed spontaneously from the uterus by massage and was then  handed off for cord blood collection and the uterus was cleared of all  clots and debris with moist lap sponge.  The uterine incision was then  closed with zero chromic in a running locked fashion in 1 layer.  Several additional figure-of-eight sutures were placed along the  patient's left incision line for hemostasis with good result.   At this point attention was turned to the patient's fallopian tubes.  The left fallopian tube was elevated and lifted out of the incision.  It  was then grasped with a Babcock clamp and traced out to its fimbriated  end and a 2-3 cm knuckle of tube was held up in a Babcock clamp.  The  mesosalpinx was opened with Bovie cautery and a suture passed through  it.  The 2-3 cm segment of tube was then tied off with zero plain suture  and this was repeated with another suture with 2 free ties placed around  it.  The segment of tube was amputated and the pedicles  were cauterized  with Bovie cautery.  The tube appeared hemostatic.  It was returned to  the abdomen.  In a similar fashion the right fallopian tube was elevated  and a 2-3 cm segment tied off with 1 free tie of zero plain and the  segment was then amputated.  The pedicles were then cauterized  additionally with Bovie cautery and the tube returned to the patient's  abdomen hemostatic.   Attention was then returned to the incision where a small amount of  bleeding was noted along the left-hand side.  This was adequately  controlled with a running suture of 3-0 Vicryl.  The incision was once  again inspected and now was found to be completely hemostatic.  Any  areas of oozing along the bladder flap were cauterized with Bovie  cautery and the instruments and sponges were then removed from the  patient's abdomen.  Her fascia was then closed after inspection of the  subfascial planes revealed no bleeding and was closed in a running  fashion with zero Vicryl.  The skin was closed with staples.  Sponge,  lap and needle counts were correct x2 and the patient was taken to the  recovery room in good condition.      Huel Cote, M.D.  Electronically Signed     KR/MEDQ  D:  08/23/2007  T:  08/23/2007  Job:  [id number]

## 2010-12-27 NOTE — H&P (Signed)
NAMEGABBI, Cynthia [name]               ACCOUNT NO.:  [id number]   MEDICAL RECORD NO.:  [id number]          PATIENT TYPE:  INP   LOCATION:  NA                            FACILITY:  [facility]   PHYSICIAN:  Huel Cote, M.D. DATE OF BIRTH:  08-12-1971   DATE OF ADMISSION:  08/23/2007  DATE OF DISCHARGE:                              HISTORY & PHYSICAL   Dictating for surgery to take place on August 23, 2007 at the [facility] facility at 9:30 a.m.   HISTORY OF PRESENT ILLNESS:  The patient is a 40 year old G2, 1, 0, 0 1,  who is coming in for a scheduled repeat cesarean section as she declines  a trial of labor and is thought to have another macrosomic baby like her  last pregnancy.  She is essentially 39-weeks gestation with an estimated  due date of August 31, 2007.  Dating is very accurate by a 9-week  ultrasound which actually gave her a due date of August 29, 2007 which  was in agreement with her period.  Pregnancy was uncomplicated except  for a noted size greater than dates which was though to be consistent  with another macrosomic infant with her last gestation being  significantly large at 9 pounds, 8 ounces at delivery.  She has had  normal Glucola screenings with both pregnancies and no other  complications.   PRENATAL LABS ARE AS FOLLOWS:  Blood type is A positive, antibody  negative, RPR nonreactive, rubella immune, hepatitis B positive,  hepatitis B surface antigen negative, HIV negative, GC negative,  Chlamydia negative, group B strep negative. One hour Glucola was 89.  First trimester screen was normal.   PAST OBSTETRICAL HISTORY:  Significant for a C-section performed in  March, 2009 for a 9 pound, 8 ounce infant after a failure to progress in  labor.   PAST GYN HISTORY:  Significant for cryosurgery in 1995.   PAST SURGICAL HISTORY:  Laparoscopy in 2004.  A bunionectomy performed  in 2004.  C-section performed in 2007.   PAST MEDICAL HISTORY:  None  significant.   ALLERGIES:  ANAPROX, DOXICYCLINE, MECLOZINE.   MEDICATIONS:  Currently on no medications.   PHYSICAL EXAMINATION:  VITAL SIGNS:  Her weight is 167 pounds, blood  pressure is 98/60.  CARDIAC EXAM:  Regular rate and rhythm.  LUNGS:  Clear to auscultation bilaterally.  ABDOMEN:  Gravid with a fundal height of 42 and an estimated fetal  weight of over 9 pounds.  PELVIC:  Cervix is long and closed.   The patient was counseled of the risks and benefits of repeat cesarean  section including bleeding, infection and possible damage to bowel and  bladder.  She understands these risks as well as the risk of VBAC and  elects to proceed with a repeat cesarean section.  She is also  requesting that she have permanent sterilization with a bilateral  tubal ligation.  It was discussed with her in detail that this is a  permanent procedure with a risk of about  1:100 for ectopic pregnancy or any subsequent pregnancy and failure of  the  tubal. She understands these risks and desires to proceed with a  tubal ligation at the same time as her C-section.      Huel Cote, M.D.  Electronically Signed     KR/MEDQ  D:  08/22/2007  T:  08/22/2007  Job:  [id number]

## 2010-12-29 ENCOUNTER — Telehealth: Payer: Self-pay | Admitting: *Deleted

## 2010-12-29 NOTE — Telephone Encounter (Signed)
Pt. States the pharmacy should have sent over refill requests Vicodin and amitriptyline.  Did Dr. Cato Mulligan get it?  She is out of meds.  Call her at [phone].

## 2010-12-30 ENCOUNTER — Ambulatory Visit: Payer: 59 | Admitting: Internal Medicine

## 2010-12-30 ENCOUNTER — Telehealth: Payer: Self-pay | Admitting: *Deleted

## 2010-12-30 DIAGNOSIS — R51 Headache: Secondary | ICD-10-CM

## 2010-12-30 MED ORDER — NORTRIPTYLINE HCL 10 MG PO CAPS: 10.0000 mg | ORAL_CAPSULE | Freq: Every day | ORAL | Status: DC

## 2010-12-30 NOTE — Telephone Encounter (Signed)
Pt called back asking for refills per previous note.

## 2010-12-30 NOTE — Telephone Encounter (Signed)
Ok to refill elavil Do not refill vicodin

## 2010-12-30 NOTE — Discharge Summary (Signed)
NAMEELENI, Cynthia [name]               ACCOUNT NO.:  [id number]   MEDICAL RECORD NO.:  [id number]          PATIENT TYPE:  INP   LOCATION:  9120                          FACILITY:  [facility]   PHYSICIAN:  Huel Cote, M.D. DATE OF BIRTH:  [birth year]   DATE OF ADMISSION:  08/23/2007  DATE OF DISCHARGE:  08/26/2007                               DISCHARGE SUMMARY   DISCHARGE DIAGNOSES:  1. Term pregnancy at 39 weeks, delivered.  2. Repeat cesarean section, declined trial of labor.  3. Desires permanent sterility  4. Status post repeat low transverse cesarean section and tubal      ligation   DISCHARGE MEDICATIONS:  1. Motrin 600 mg p.o. every 6 hours.  2. Percocet 1 to 2 tablets p.o. every 4 hours p.r.n.   DISCHARGE FOLLOWUP:  The patient is to follow-up in 2 weeks for an  incision check.   HOSPITAL COURSE:  The patient is a 40 year old G2, P-1-0-0-1 came in for  a scheduled repeat cesarean section given that she declined a trial of  labor.  She also requested permanent sterilization with a tubal  ligation.  For her additional history and physical please see history  and physical that was previously dictated prior to C section.  She  underwent a repeat low transverse C-section and tubal ligation and was  delivered of a vigorous female infant in the vertex presentation.  Apgars were 9 and 9, weight was 9 pounds, 3 ounces.  She had normal  anatomy noted at the time of surgery.  She had an estimated blood loss  of 1200 mL and was admitted for postoperative care.  She did quite well.  On postpartum day #1 hemoglobin was 10 down from 11.2.  She recovered  quite nicely and on postop day #3 was doing well was ready for discharge  and tolerating a regular diet.  Pain was well-controlled with her Motrin  and Percocet.  The incision was clear.  Steri-Strips were placed and  staples removed and the patient was allowed to be discharged home.      Huel Cote, M.D.  Electronically  Signed     KR/MEDQ  D:  09/24/2007  T:  09/25/2007  Job:  [id number]

## 2010-12-30 NOTE — Discharge Summary (Signed)
Cynthia [name], Cynthia [name]               ACCOUNT NO.:  [id number]   MEDICAL RECORD NO.:  [id number]          PATIENT TYPE:  INP   LOCATION:  9113                          FACILITY:  [facility]   PHYSICIAN:  Huel Cote, M.D. DATE OF BIRTH:  11/17/1970   DATE OF ADMISSION:  10/13/2005  DATE OF DISCHARGE:  10/17/2005                                 DISCHARGE SUMMARY   DISCHARGE DIAGNOSES:  1.  Term pregnancy at 40+ weeks, delivered.  2.  Arrest of dilation.  3.  Primary low-transverse cesarean section.   DISCHARGE MEDICATIONS:  1.  Motrin 600 mg p.o. every 6 hours.  2.  Vicodin 1-2 tablets p.o. every 6 hours p.r.n.   DISCHARGE FOLLOWUP:  The patient is to follow up in 2 weeks for her incision  check and 6 weeks for her postpartum exam.   HOSPITAL COURSE:  The patient is a 40 year old G1, P0, who was admitted at  76 and 4/7 weeks for induction and ripening.  The patient was post dates and  requested induction as was approaching 41 weeks.  Prenatal care had been  complicated by a low lying placenta, which resolved and a questionable  succenturiate lobe.  There were no other significant issues except for an  unfavorable cervix.   PRENATAL LABORATORY:  A positive, antibody negative, RPR nonreactive,  rubella immune, hepatitis B surface antigen negative, HIV negative, GC and  Chlamydia negative, triple screen normal, 1-hour Glucola 103 and group B  strep negative.   PAST OBSTETRICAL HISTORY:  None.   PAST GYNECOLOGICAL HISTORY:  History of endometriosis, history of cryo  surgery with Pap smears normal after that point.   PAST SURGICAL HISTORY:  In 2004, she had a laparoscopic with endometriosis  noted.  In 2004, she had a bunionectomy.   PAST MEDICAL HISTORY:  None.   ALLERGIES:  1.  ANAPROX  2.  DOXYCYCLINE.  3.  MECLIZINE.   MEDICATIONS:  Zantac on admission.   Today, she was afebrile with stable vital signs.  Fetal heart rate was  reactive.  The cervix was 51 and vertex was  out of the pelvis.  Therefore,  the patient was Cervidil'd and then placed on Pitocin.  She progressed  throughout the day, and eventually had spontaneous rupture of membranes and  at that point, she was 70% effaced, 1 cm and a -2 station.  She eventually  received an epidural and progressed to approximately 4+ cm dilation.  However, despite adequate contractions never progressed beyond that point.  Her temperature was 99.5 and she then underwent a primary low-transverse  cesarean section with a double layer closure of the uterus and was delivered  of a viable female infant, Apgars were 8 and 9, and weight was 9 pounds 8  ounces.  She had a normal uterus, and tubes and ovaries noted at the time of  surgery.  She was then admitted for routine postoperative care.  Postop  hemoglobin was 8.4.  She did well and on postop day #3, was tolerating a  regular diet, voiding without difficulty, her pain was well controlled,  incision was  clear, Steri-Strips placed and staples removed, and therefore,  she was felt stable for discharge home with followup in 2 weeks at the  office for an incision check.      Huel Cote, M.D.  Electronically Signed     KR/MEDQ  D:  10/17/2005  T:  10/17/2005  Job:  [id number]

## 2010-12-30 NOTE — Op Note (Signed)
NAME:  Cynthia [name], Cynthia [name]                         ACCOUNT NO.:  [id number]   MEDICAL RECORD NO.:  [id number]                   PATIENT TYPE:  AMB   LOCATION:  SDC                                  FACILITY:  [facility]   PHYSICIAN:  Guy Sandifer. Arleta Creek, M.D.           DATE OF BIRTH:  [birth year]   DATE OF PROCEDURE:  11/14/2002  DATE OF DISCHARGE:                                 OPERATIVE REPORT   PREOPERATIVE DIAGNOSIS:  Pelvic pain.   POSTOPERATIVE DIAGNOSES:  1. Endometriosis.  2. Left ovarian cyst.   PROCEDURE:  Laparoscopy with left ovarian cystectomy and ablation of  endometriosis.   SURGEON:  Guy Sandifer. Henderson Cloud, M.D.   ANESTHESIA:  General with endotracheal intubation.   ANESTHESIOLOGIST:  Belva Agee, M.D.   ESTIMATED BLOOD LOSS:  Drops.   SPECIMENS:  1. Aspirate of left ovarian cyst.  2. Left ovarian cyst contents and cyst wall.   INDICATIONS AND CONSENT:  This patient is a 40 year old married white  female; G1, P0, with a history of severe dysmenorrhea.  Details are dictated  in the history and physical.  Laparoscopy is discussed with the patient.  The potential risks and complications have been discussed, including but not  limited to:  infection, bowel, bladder or ureteral damage, bleeding  requiring transfusion of blood products with possible transfusion reaction,  HIV and hepatitis acquisition, DVT, PE, pneumonia, recurrent pain,  dyspareunia.  All questions have been answered and consent is signed on the  chart.   FINDINGS:  The uterus is normal in size and contour.  The anterior cul-de-  sac is normal.  Posterior cul-de-sac contains dark brown implants of  endometriosis.  There is invagination of the peritoneum lateral to the  uterosacral ligaments bilaterally, consistent with early endometriosis.  The  right ovary contains 3-4 less than 1 cm translucent cysts.  The left ovary  contains a 3-4 cm smooth, translucent cyst.  The fallopian tubes are normal  bilaterally with luxuriant fimbria.   PROCEDURE:  The patient is taken to the operating room.  She did receive IV  antibiotics prior to the procedure.  She is placed in the dorsal supine  position, where general anesthesia is induced via endotracheal intubation.  She is then placed in the dorsal lithotomy position, where she is prepped  abdominally and vaginally.  The bladder is straight catheterized.  A Hulka  tenaculum was placed in the uterus as a manipulator, and she is draped in  the sterile fashion.  An infraumbilical incision is made and a 10-11  disposable trocar sleeve is placed on the first attempt without difficulty.  Placement is verified with the laparoscope and no damage to surrounding  structures is noted.  Pneumoperitoneum is induced.  A small suprapubic  incision is made and a 5 mm nondisposable trocar sleeve is placed under  direct visualization without difficulty.  The above findings are noted.  The  left  ovary is aspirated for straw-colored serous fluid, which is sent for  cytology.  The left ovarian cyst is then entered with scissors.  Tissue  extrudes from the cyst.  This is grasped and extracted out in a single  piece.  This is removed and sent to pathology.  Hemostasis is obtained with  bipolar cautery on the left ovary.  The small cysts on the right ovary are  incised with the scissors, and hemostasis is again obtained with bipolar  cautery.  The area of endometriosis in the posterior cul-de-sac are ablated  with bipolar cautery.  Implants of endometriosis lateral to the uterosacral  ligaments are ablated bilaterally as well, after identification of the  ureter is carried out bilaterally.  Copious irrigation was carried out  throughout the case.  Intercede is backloaded with the laparoscope and  placed around each ovary, slightly moistened.  Excess fluid is removed.  Suprapubic trocar sleeve is removed.  The pneumoperitoneum is released.  Good hemostasis is noted  all around.  The umbilical trocar sleeve is  removed.  The umbilical incision is closed with a subcuticular and a simple  stitch at the apex of 3-0 Vicryl.  Both incisions are injected with 0.5%  plain Marcaine.  Dermabond is used to close the skin of both incisions.  The  Hulka tenaculum is removed and good hemostasis is noted.  All counts are  correct.  The patient is awakened and taken to the recovery room in stable  condition.                                                Guy Sandifer Arleta Creek, M.D.    JET/MEDQ  D:  11/14/2002  T:  11/15/2002  Job:  [id number]

## 2010-12-30 NOTE — Telephone Encounter (Signed)
Refer to headache clinic. Once appt date is known i'll consider prescribing pain meds

## 2010-12-30 NOTE — Op Note (Signed)
NAMEMARLITA, KEIL               ACCOUNT NO.:  [id number]   MEDICAL RECORD NO.:  [id number]          PATIENT TYPE:  INP   LOCATION:  9169                          FACILITY:  [facility]   PHYSICIAN:  Huel Cote, M.D. DATE OF BIRTH:  1971-01-03   DATE OF PROCEDURE:  10/14/2005  DATE OF DISCHARGE:                                 OPERATIVE REPORT   PREOPERATIVE DIAGNOSES:  1.  Term pregnancy at 40+ weeks.  2.  Arrest of dilation.   POSTOPERATIVE DIAGNOSES:  1.  Term pregnancy at 40+ weeks.  2.  Arrest of dilation.   PROCEDURE:  Primary low transverse cesarean section.   SURGEON:  Dr. Huel Cote.   ASSISTANT:  None.   ANESTHESIA:  Epidural.   SPECIMENS:  Placenta with a succenturiate lobe noted.   ESTIMATED BLOOD LOSS:  800 mL.   URINE OUTPUT:  200 mL.   IV FLUIDS:  2300 mL.   FINDINGS:  There is a vigorous female infant in the vertex presentation.  Apgars were 8 and 9.  Weight was 9 pounds 8 ounces.  Normal uterus, ovaries,  and tubes were noted   PROCEDURE:  The patient was taken to the operating room where epidural  anesthesia was found to be adequate by Allis clamp test.  A Pfannenstiel  skin incision was then made with the scalpel and carried through to  underlying layer of fascia by sharp dissection and Bovie cautery.  Fascia  was then nicked in the midline.  The incision was extended laterally with  Mayo scissors.  The inferior aspect grasped with Kocher clamps, elevated,  and dissected off the rectus muscles.  In a similar fashion, the superior  aspect was dissected off the rectus muscles.  The rest of the rectus muscles  were separated in the midline and the peritoneal cavity entered bluntly.  The peritoneal incision was then extended both superiorly and inferiorly  with careful attention to avoid both bowel and bladder.  The large self-  retaining Alexis wound retractor was then placed within the incision and the  lower uterine segment exposed nicely.   The scalp was then used to incise the  lower uterine segment, and the bladder fell away from this without  difficulty.  The uterus was then incised in a transverse fashion and the  cavity itself entered bluntly.  This incision was extended laterally with  bandage scissors.  With the uterus then open, the infant's head was  delivered atraumatically and bulb suctioned, the remainder of the infant's  body delivered without difficulty, and the cord was clamped, cut, and the  infant handed to the waiting pediatricians.  The placenta was then delivered  manually with a large succenturiate lobe noted.  This was sent for  pathology.  The uterus was cleared of all clots and debris with moist lap  sponge.  The uterine incision was then closed with 3-0 chromic in a running  locked fashion.  A second layer of the same was used in an imbricating  fashion.  At this point, there was no active bleeding noted.  The tubes and  ovaries were inspected and found be normal.  The rectus muscle was  reapproximated with 2 mattress sutures of the 0 chromic.  The fascia was  closed with 0 Vicryl in running fashion, and the skin was closed with  staples.  Sponge, lap, and needle counts were correct x2, and the patient  was taken to the recovery room in stable condition.      Huel Cote, M.D.  Electronically Signed     KR/MEDQ  D:  10/14/2005  T:  10/14/2005  Job:  [id number]

## 2010-12-30 NOTE — H&P (Signed)
   NAME:  Cynthia [name], Cynthia [name]                         ACCOUNT NO.:  [id number]   MEDICAL RECORD NO.:  [id number]                   PATIENT TYPE:  AMB   LOCATION:  SDC                                  [facility]ILITY:  [facility]   PHYSICIAN:  Guy Sandifer. Arleta Creek, M.D.           DATE OF BIRTH:  1971/01/23   DATE OF ADMISSION:  11/14/2002  DATE OF DISCHARGE:                                HISTORY & PHYSICAL   CHIEF COMPLAINT:  Pelvic pain.   HISTORY OF PRESENT ILLNESS:  The patient is a 40 year old, married, white  female, G1, P0, AB1, with a history of severe dysmenorrhea.  This past  month, she has had basically continuous pain in the right lower quadrant.  Pelvic ultrasound at [facility] on October 31, 2002, revealed normal  uterus and normal left ovary.  The right ovary was difficult to visualize  secondary to its high position in the pelvis, but appeared to have two  separate 1.5 cm cysts.  Options have been discussed.  The patient is being  admitted for laparoscopy.  The potential risks and complications have been  discussed with the patient preoperatively.   PAST MEDICAL HISTORY:  History of cervical dysplasia.   PAST SURGICAL HISTORY:  Status post cryotherapy of the cervix.   OBSTETRICAL HISTORY:  Miscarriage x 1.   SOCIAL HISTORY:  The patient denies tobacco, alcohol, or drug abuse.   FAMILY HISTORY:  Positive for heart attack in paternal grandparents.   MEDICATIONS:  Prenatal vitamins   DRUG ALLERGIES:  ANAPROX leading to swelling (the patient states Advil is  okay and she frequently takes it without problems).   REVIEW OF SYSTEMS:  No fever.  No shortness of breath.  No cough.  No  dizziness.  No changes in bowel or bladder habits.   PHYSICAL EXAMINATION:  HEIGHT:  61 inches.  WEIGHT:  135 pounds.  VITAL SIGNS:  Blood pressure 102/60.  HEENT:  Without thyromegaly.  LUNGS:  Clear to auscultation.  HEART:  Regular rate and rhythm.  BACK:  Without CVA tenderness.  BREASTS:   Without mass, retraction, or discharge.  ABDOMEN:  Soft and nontender without masses.  PELVIC:  Vulva, vagina, and cervix without lesion.  Uterus normal size and  nontender.  Right adnexa mildly tender without palpable masses.  Left adnexa  nontender without masses.  EXTREMITIES:  Grossly within normal limits.  NEUROLOGIC:  Grossly within normal limits.   ASSESSMENT:  Dysmenorrhea and pelvic pain.   PLAN:  Laparoscopy.                                               Guy Sandifer Arleta Creek, M.D.    JET/MEDQ  D:  11/07/2002  T:  11/07/2002  Job:  [id number]

## 2010-12-30 NOTE — Telephone Encounter (Signed)
Wants to know if Dr. Cato Mulligan will give her pain meds until she goes to the headache clinic, and if she can make her appt, or do we do that for her?

## 2010-12-30 NOTE — Telephone Encounter (Signed)
Pt wants to know if she can have pain meds until she get appt at headache clinic.  Can she make her own appt?

## 2010-12-30 NOTE — Telephone Encounter (Signed)
Left message on pt's voice mail with Dr. Cato Mulligan' recommendations.  Sent Elavil to pharmacy.

## 2011-01-23 ENCOUNTER — Other Ambulatory Visit: Payer: Self-pay | Admitting: Obstetrics and Gynecology

## 2011-01-23 DIAGNOSIS — Z1231 Encounter for screening mammogram for malignant neoplasm of breast: Secondary | ICD-10-CM

## 2011-01-30 ENCOUNTER — Ambulatory Visit
Admission: RE | Admit: 2011-01-30 | Discharge: 2011-01-30 | Disposition: A | Discharge status: Home / Self Care | Payer: 59 | Source: Ambulatory Visit | Attending: Obstetrics and Gynecology | Admitting: Obstetrics and Gynecology

## 2011-01-30 DIAGNOSIS — Z1231 Encounter for screening mammogram for malignant neoplasm of breast: Secondary | ICD-10-CM

## 2011-05-01 ENCOUNTER — Other Ambulatory Visit: Payer: Self-pay | Admitting: Internal Medicine

## 2011-05-02 ENCOUNTER — Other Ambulatory Visit: Payer: Self-pay | Admitting: Internal Medicine

## 2011-05-03 LAB — URINE MICROSCOPIC-ADD ON

## 2011-05-03 LAB — URINE CULTURE: Culture: NO GROWTH

## 2011-05-03 LAB — URINALYSIS, ROUTINE W REFLEX MICROSCOPIC
Leukocytes, UA: NEGATIVE
Nitrite: NEGATIVE
Specific Gravity, Urine: 1.015
Urobilinogen, UA: 0.2

## 2011-05-03 LAB — RPR: RPR Ser Ql: NONREACTIVE

## 2011-05-03 LAB — CBC
MCHC: 34.4
MCHC: 35
MCV: 93.4
Platelets: 214
RBC: 3.49 — ABNORMAL LOW
RDW: 12.9

## 2011-05-03 LAB — TYPE AND SCREEN: ABO/RH(D): A POS

## 2011-05-03 LAB — CCBB MATERNAL DONOR DRAW

## 2011-05-04 ENCOUNTER — Telehealth: Payer: Self-pay | Admitting: *Deleted

## 2011-05-04 NOTE — Telephone Encounter (Signed)
Pt states celexa is not working and wants Zoloft

## 2011-05-05 MED ORDER — SERTRALINE HCL 50 MG PO TABS: 50.0000 mg | ORAL_TABLET | Freq: Every day | ORAL | Status: DC

## 2011-05-05 NOTE — Telephone Encounter (Signed)
This was answered during down time---see paper note

## 2011-05-05 NOTE — Telephone Encounter (Signed)
Per Dr Cato Mulligan Zoloft 50 mg once daily #30/3 refills

## 2011-05-08 ENCOUNTER — Telehealth: Payer: Self-pay | Admitting: Internal Medicine

## 2011-05-08 NOTE — Telephone Encounter (Signed)
Patient is having the same symptoms she had last year.  Pain in her right upper side that radiates up to her back and shoulder.  She says the pain is positional and correspond with meals.  No nausea, constipation or diarrhea.  She is no longer taking the amitriptylline that she started on last year.  I have given her an appt for Dr Leone Payor for 06/12/11.  I have asked her to see her primary care MD in the interim.  She had negative HIDA and Endo last year with the same symptoms.

## 2011-05-09 NOTE — Telephone Encounter (Signed)
Ok agree

## 2011-05-10 ENCOUNTER — Ambulatory Visit (INDEPENDENT_AMBULATORY_CARE_PROVIDER_SITE_OTHER): Payer: 59 | Admitting: Family Medicine

## 2011-05-10 ENCOUNTER — Encounter: Payer: Self-pay | Admitting: Family Medicine

## 2011-05-10 VITALS — BP 90/62 | Temp 98.3°F | Wt 142.0 lb

## 2011-05-10 DIAGNOSIS — R319 Hematuria, unspecified: Secondary | ICD-10-CM

## 2011-05-10 DIAGNOSIS — R1011 Right upper quadrant pain: Secondary | ICD-10-CM

## 2011-05-10 LAB — POCT URINALYSIS DIPSTICK
Bilirubin, UA: NEGATIVE
Blood, UA: NEGATIVE
Nitrite, UA: NEGATIVE
Urobilinogen, UA: 0.2
pH, UA: 6.5

## 2011-05-10 MED ORDER — NAPROXEN 500 MG PO TABS: 500.0000 mg | ORAL_TABLET | Freq: Two times a day (BID) | ORAL | Status: DC

## 2011-05-10 NOTE — Patient Instructions (Signed)
Follow up for any fever, vomiting, or any worsening pain. 

## 2011-05-10 NOTE — Progress Notes (Signed)
  Subjective:    Patient ID: Cynthia [name], female    DOB: [birth year], 40 y.o.   MRN: [id number]  HPI Patient seen with recurrent pain right upper abdominal region. Similar symptoms last year and had extensive workup with ultrasound, gallbladder nuclear study and EGD all unremarkable. That pain eventually resolved. She has done some lifting of furniture but does not recall specific injury. The patient is right lower rib cage with some radiation posterior. No rash. Some nausea but no vomiting. No correlation with foods. Exacerbated by movement and somewhat sore to touch. Pain is roughly constant. Moderate severity. No fever or chills. No dyspnea. Occasional dry cough but has had some recent postnasal drip symptoms. No alleviating factors.   Review of Systems  Constitutional: Negative for fever, chills and unexpected weight change.  Respiratory: Positive for cough. Negative for wheezing.   Cardiovascular: Negative for chest pain.  Gastrointestinal: Positive for abdominal pain. Negative for nausea, vomiting, diarrhea, constipation, blood in stool and abdominal distention.  Genitourinary: Negative for dysuria.  Skin: Negative for rash.  Neurological: Negative for dizziness.  Hematological: Negative for adenopathy.       Objective:   Physical Exam  Constitutional: She appears well-developed and well-nourished.  Cardiovascular: Normal rate and regular rhythm.   Pulmonary/Chest: Effort normal and breath sounds normal. No respiratory distress. She has no wheezes. She has no rales.  Abdominal: Soft. She exhibits no mass. There is no rebound and no guarding.       Chest some mild tenderness right upper quadrant mostly over the right lower rib cage region. No masses palpated. No skin changes.  Skin: No rash noted.          Assessment & Plan:  Abdominal pain, right upper quadrant. This appears be more musculoskeletal. Short-term trial of naproxen 500 mg twice a day with food. Followup promptly  for any fever, vomiting, or worsening pain

## 2011-05-12 ENCOUNTER — Telehealth: Payer: Self-pay | Admitting: *Deleted

## 2011-05-12 NOTE — Telephone Encounter (Signed)
Very likely viral.  I would try OTC NSAID if she can take otherwise and try Mucinex DM for cough.  Needs to be seen if not adequate relief with that. Most of the viral things we are seeing are lasting 8-10 days.

## 2011-05-12 NOTE — Telephone Encounter (Signed)
Pt has developed extremely sore throat and non productive cough.  No temp.  Would like to have Rx called to [facility] ([place]

## 2011-05-12 NOTE — Telephone Encounter (Signed)
Left detailed message on pt's personal voice mail.

## 2011-05-15 ENCOUNTER — Ambulatory Visit (INDEPENDENT_AMBULATORY_CARE_PROVIDER_SITE_OTHER): Payer: 59 | Admitting: Family Medicine

## 2011-05-15 ENCOUNTER — Encounter: Payer: Self-pay | Admitting: Family Medicine

## 2011-05-15 VITALS — BP 100/70 | HR 132 | Temp 99.8°F | Wt 135.0 lb

## 2011-05-15 DIAGNOSIS — J40 Bronchitis, not specified as acute or chronic: Secondary | ICD-10-CM

## 2011-05-15 MED ORDER — HYDROCODONE-HOMATROPINE 5-1.5 MG/5ML PO SYRP: 5.0000 mL | ORAL_SOLUTION | ORAL | Status: DC | PRN

## 2011-05-15 MED ORDER — AZITHROMYCIN 250 MG PO TABS: ORAL_TABLET | ORAL | Status: AC

## 2011-05-15 NOTE — Progress Notes (Signed)
  Subjective:    Patient ID: Cynthia [name], female    DOB: 10-Nov-1970, 40 y.o.   MRN: [id number]  HPI Here for 4 days of sinus pressure, PND, ST, and coughing up green sputum. No fever.    Review of Systems  Constitutional: Negative.   HENT: Positive for congestion, postnasal drip and sinus pressure.   Eyes: Negative.   Respiratory: Positive for cough.        Objective:   Physical Exam  Constitutional: She appears well-developed and well-nourished.  HENT:  Right Ear: External ear normal.  Left Ear: External ear normal.  Nose: Nose normal.  Mouth/Throat: Oropharynx is clear and moist. No oropharyngeal exudate.  Eyes: Conjunctivae are normal. Pupils are equal, round, and reactive to light.  Neck: No thyromegaly present.  Pulmonary/Chest: Effort normal and breath sounds normal. No respiratory distress. She has no wheezes. She has no rales.       Scattered rhonchi   Lymphadenopathy:    She has no cervical adenopathy.          Assessment & Plan:  Off work today and tomorrow

## 2011-05-24 ENCOUNTER — Telehealth: Payer: Self-pay | Admitting: *Deleted

## 2011-05-24 NOTE — Telephone Encounter (Signed)
Call in another bottle of Hydromet

## 2011-05-24 NOTE — Telephone Encounter (Signed)
Pt states she feels much better from bronchitis, but cannot stop coughing.  Needs refill on cough meds.  Going on a cruise Friday.

## 2011-05-25 MED ORDER — HYDROCODONE-HOMATROPINE 5-1.5 MG/5ML PO SYRP: 5.0000 mL | ORAL_SOLUTION | ORAL | Status: AC | PRN

## 2011-05-25 NOTE — Telephone Encounter (Signed)
Notified pt. 

## 2011-06-12 ENCOUNTER — Ambulatory Visit: Payer: 59 | Admitting: Internal Medicine

## 2011-08-31 ENCOUNTER — Other Ambulatory Visit: Payer: Self-pay | Admitting: Internal Medicine

## 2011-09-27 ENCOUNTER — Ambulatory Visit (INDEPENDENT_AMBULATORY_CARE_PROVIDER_SITE_OTHER): Payer: 59 | Admitting: Family

## 2011-09-27 ENCOUNTER — Encounter: Payer: Self-pay | Admitting: Family

## 2011-09-27 VITALS — BP 118/60 | Temp 98.1°F | Wt 132.0 lb

## 2011-09-27 DIAGNOSIS — R1031 Right lower quadrant pain: Secondary | ICD-10-CM

## 2011-09-27 DIAGNOSIS — F419 Anxiety disorder, unspecified: Secondary | ICD-10-CM

## 2011-09-27 DIAGNOSIS — F341 Dysthymic disorder: Secondary | ICD-10-CM

## 2011-09-27 LAB — SEDIMENTATION RATE: Sed Rate: 17 mm/hr (ref 0–22)

## 2011-09-27 LAB — CBC WITH DIFFERENTIAL/PLATELET
Basophils Absolute: 0 10*3/uL (ref 0.0–0.1)
Eosinophils Absolute: 0.1 10*3/uL (ref 0.0–0.7)
HCT: 36.2 % (ref 36.0–46.0)
Hemoglobin: 12.4 g/dL (ref 12.0–15.0)
Lymphs Abs: 1.6 10*3/uL (ref 0.7–4.0)
MCHC: 34.3 g/dL (ref 30.0–36.0)
Monocytes Relative: 7.5 % (ref 3.0–12.0)
Neutro Abs: 2.1 10*3/uL (ref 1.4–7.7)
Platelets: 206 10*3/uL (ref 150.0–400.0)
RDW: 13 % (ref 11.5–14.6)

## 2011-09-27 LAB — POCT URINALYSIS DIPSTICK
Blood, UA: NEGATIVE
Ketones, UA: NEGATIVE
Protein, UA: NEGATIVE
Spec Grav, UA: 1.02
Urobilinogen, UA: 0.2
pH, UA: 7

## 2011-09-27 LAB — BASIC METABOLIC PANEL
BUN: 15 mg/dL (ref 6–23)
Chloride: 112 mEq/L (ref 96–112)
Creatinine, Ser: 0.7 mg/dL (ref 0.4–1.2)

## 2011-09-27 MED ORDER — LORAZEPAM 1 MG PO TABS: 1.0000 mg | ORAL_TABLET | Freq: Two times a day (BID) | ORAL | Status: AC | PRN

## 2011-09-27 MED ORDER — DULOXETINE HCL 30 MG PO CPEP: 30.0000 mg | ORAL_CAPSULE | Freq: Every day | ORAL | Status: DC

## 2011-09-27 NOTE — Progress Notes (Signed)
Subjective:    Patient ID: Cynthia [name], female    DOB: 05/10/1971, 41 y.o.   MRN: [id number]  HPI Is a 41 year old white female, nonsmoker, patient of Dr. Abelina Bachelor is in today with chronic right lower quadrant pain. She seen GI and gynecology with an extensive workup to determine the cause of her abdominal pain. She's had an EGD, various ultrasounds, endoscopy and numerous lab studies that have revealed no known etiology to her abdominal pain. She continues to complain of right lower quadrant pain on average of 3/10 it tends to radiate to her back, is worse in the morning. Pain is also worse but she has a full bladder. The patient has a history of endometriosis, but her gynecologist does not believe that her symptoms are related to endometriosis. She's also had a hysterectomy and 2 C-sections. She's been taking Vicodin helps her symptoms. She denies any lightheadedness, dizziness, chest pain, palpitations, nausea, vomiting, diarrhea or constipation.  Patient has a history of anxiety and depression that is unstable. She currently takes Zoloft 50 mg a day. She continues to complain of anxiety and depression. Has a lot of helplessness and hopelessness but denies any thoughts of death or dying. As of note, patient has a husband and has bipolar disorder. She also has a family history of bipolar disorder.   Review of Systems  Constitutional: Negative.   Eyes: Negative.   Respiratory: Negative.   Cardiovascular: Negative.   Gastrointestinal: Positive for abdominal pain.       Right lower quadrant  Genitourinary: Negative.   Musculoskeletal: Negative.   Neurological: Positive for headaches.  Hematological: Negative.   Psychiatric/Behavioral: Positive for sleep disturbance and agitation. Negative for suicidal ideas. The patient is nervous/anxious.    Past Medical History  Diagnosis Date  . Anxiety and depression 04/03/2008  . Acne   . Hemorrhoids     History   Social History  . Marital  Status: Married    Spouse Name: N/A    Number of Children: N/A  . Years of Education: N/A   Occupational History  . Not on file.   Social History Main Topics  . Smoking status: Former Smoker    Quit date: 08/15/2003  . Smokeless tobacco: Never Used  . Alcohol Use: Yes     socially  . Drug Use: No  . Sexually Active: Not on file   Other Topics Concern  . Not on file   Social History Narrative  . No narrative on file    Past Surgical History  Procedure Date  . Partial hysterectomy   . Bunionectomy   . Cesarean section     x 2    Family History  Problem Relation Age of Onset  . Prostate cancer Maternal Grandfather   . Kidney disease Sister   . Heart disease Paternal Grandfather   . Depression      Allergies  Allergen Reactions  . Doxycycline Hyclate     REACTION: heartburn  . Meclizine Hcl     REACTION: urticaria (hives)  . Morphine   . Naproxen Sodium     REACTION: swelling,hives    Current Outpatient Prescriptions on File Prior to Visit  Medication Sig Dispense Refill  . HYDROcodone-acetaminophen (NORCO) 5-325 MG per tablet Take 1 tablet by mouth every 6 (six) hours as needed.  30 tablet  1  . sertraline (ZOLOFT) 50 MG tablet TAKE ONE TABLET BY MOUTH DAILY  30 tablet  5  . topiramate (TOPAMAX) 100 MG tablet as needed.       Marland Kitchen  ALPRAZolam (XANAX) 0.5 MG tablet Take 1 tablet (0.5 mg total) by mouth every other day.  10 tablet  1  . diphenhydramine-acetaminophen (TYLENOL PM) 25-500 MG TABS Take 1 tablet by mouth at bedtime as needed.        . metaxalone (SKELAXIN) 800 MG tablet as needed.       . naproxen (NAPROSYN) 500 MG tablet Take 1 tablet (500 mg total) by mouth 2 (two) times daily with a meal.  30 tablet  1  . sulfamethoxazole-trimethoprim (BACTRIM DS) 800-160 MG per tablet Take 1 tablet by mouth 2 (two) times daily.         BP 118/60  Temp(Src) 98.1 F (36.7 C) (Oral)  Wt 132 lb (59.875 kg)chart and    Objective:   Physical Exam  Constitutional:  She is oriented to person, place, and time. She appears well-developed and well-nourished.  HENT:  Right Ear: External ear normal.  Left Ear: External ear normal.  Nose: Nose normal.  Mouth/Throat: Oropharynx is clear and moist.  Neck: Normal range of motion. Neck supple.  Cardiovascular: Normal rate, regular rhythm and normal heart sounds.   Pulmonary/Chest: Effort normal and breath sounds normal.  Abdominal: Soft. Bowel sounds are normal. There is tenderness. There is no rebound and no guarding.       Tenderness elicited to palpation of the right lower quadrant.  Musculoskeletal: Normal range of motion.  Neurological: She is alert and oriented to person, place, and time.  Skin: Skin is warm and dry.  Psychiatric: She has a normal mood and affect.          Assessment & Plan:  Assessment: Right lower quadrant abdominal pain, anxiety and depression  Plan: Labs to include UA, CBC, BMP, and ESR. DC Zoloft. Start Cymbalta 30 mg once daily times one week. Increase Cymbalta to 60 mg weeks 2. Recheck patient in 2-3 weeks. Call the office if symptoms worsen or persist, recheck a schedule, pending labs, and when necessary.

## 2011-09-27 NOTE — Patient Instructions (Signed)
Abdominal Pain Abdominal pain can be caused by many things. Your caregiver decides the seriousness of your pain by an examination and possibly blood tests and X-rays. Many cases can be observed and treated at home. Most abdominal pain is not caused by a disease and will probably improve without treatment. However, in many cases, more time must pass before a clear cause of the pain can be found. Before that point, it may not be known if you need more testing, or if hospitalization or surgery is needed. HOME CARE INSTRUCTIONS   Do not take laxatives unless directed by your caregiver.   Take pain medicine only as directed by your caregiver.   Only take over-the-counter or prescription medicines for pain, discomfort, or fever as directed by your caregiver.   Try a clear liquid diet (broth, tea, or water) for as long as directed by your caregiver. Slowly move to a bland diet as tolerated.  SEEK IMMEDIATE MEDICAL CARE IF:   The pain does not go away.   You have a fever.   You keep throwing up (vomiting).   The pain is felt only in portions of the abdomen. Pain in the right side could possibly be appendicitis. In an adult, pain in the left lower portion of the abdomen could be colitis or diverticulitis.   You pass bloody or black tarry stools.  MAKE SURE YOU:   Understand these instructions.   Will watch your condition.   Will get help right away if you are not doing well or get worse.  Document Released: 05/10/2005 Document Revised: 04/12/2011 Document Reviewed: 03/18/2008 [facility] Patient Information 2012 [place], [place].  Depression, Adolescent and Adult Depression is a true and treatable medical condition. In general there are two kinds of depression:  Depression we all experience in some form. For example depression from the death of a loved one, financial distress or natural disasters will trigger or increase depression.   Clinical depression, on the other hand, appears without an  apparent cause or reason. This depression is a disease. Depression may be caused by chemical imbalance in the body and brain or may come as a response to a physical illness. Alcohol and other drugs can cause depression.  DIAGNOSIS  The diagnosis of depression is usually based upon symptoms and medical history. TREATMENT  Treatments for depression fall into three categories. These are:  Drug therapy. There are many medicines that treat depression. Responses may vary and sometimes trial and error is necessary to determine the best medicines and dosage for a particular patient.   Psychotherapy, also called talking treatments, helps people resolve their problems by looking at them from a different point of view and by giving people insight into their own personal makeup. Traditional psychotherapy looks at a childhood source of a problem. Other psychotherapy will look at current conflicts and move toward solving those. If the cause of depression is drug use, counseling is available to help abstain. In time the depression will usually improve. If there were underlying causes for the chemical use, they can be addressed.   ECT (electroconvulsive therapy) or shock treatment is not as commonly used today. It is a very effective treatment for severe suicidal depression. During ECT electrical impulses are applied to the head. These impulses cause a generalized seizure. It can be effective but causes a loss of memory for recent events. Sometimes this loss of memory may include the last several months.  Treat all depression or suicide threats as serious. Obtain professional help. Do  not wait to see if serious depression will get better over time without help. Seek help for yourself or those around you. In the [place] the number to the National Suicide Help Lines With 24 Hour Help Are: 1-800-SUICIDE 386-512-3568 Document Released: 07/28/2000 Document Revised: 04/12/2011 Document Reviewed: 03/18/2008 [facility]  Patient Information 2012 [place], [place].

## 2011-11-08 ENCOUNTER — Telehealth: Payer: Self-pay | Admitting: *Deleted

## 2011-11-08 DIAGNOSIS — R51 Headache: Secondary | ICD-10-CM

## 2011-11-08 NOTE — Telephone Encounter (Signed)
Pt is asking for a referral to [facility] for headaches.  [facility] but [facility] not [facility] there.  Would Dr. Cato Mulligan have a problem doing this?

## 2011-11-08 NOTE — Telephone Encounter (Signed)
Ok per Dr. Swords, referral order placed 

## 2011-11-09 ENCOUNTER — Encounter: Payer: Self-pay | Admitting: Neurology

## 2011-11-13 ENCOUNTER — Telehealth: Payer: Self-pay | Admitting: *Deleted

## 2011-11-13 MED ORDER — TRAMADOL HCL 50 MG PO TABS: 50.0000 mg | ORAL_TABLET | Freq: Three times a day (TID) | ORAL | Status: AC | PRN

## 2011-11-13 NOTE — Telephone Encounter (Signed)
Pt aware.

## 2011-11-13 NOTE — Telephone Encounter (Signed)
(  triage voicemail)  Pt states she is having severe migraines and can not get in with [facility] until May 20th, requesting a rx for pain medication until upcoming appointment with neurologist.  Pharmacy is [facility] ([facility])

## 2011-11-13 NOTE — Telephone Encounter (Signed)
Can try tramadol 50 mg po bid prn (not to exceed 5 per week). #20/0 refills

## 2011-11-21 ENCOUNTER — Telehealth: Payer: Self-pay | Admitting: Family Medicine

## 2011-11-21 NOTE — Telephone Encounter (Signed)
No vicodin

## 2011-11-21 NOTE — Telephone Encounter (Signed)
Pulled from Triage vmail. Pt states that Dr. Cato Mulligan Rx'd tramadol (#20) on 4/1 for her headaches, because she cannot see [facility] til 5/20. Pt says these pills do not really work - her headache goes away for 30-40 minutes, then is right back. As a result, she already is down to 3 pills. Is wondering if Vicodin is an option - she used it before for the headaches - until she sees [facility]. Pharmacy is [facility]. Please advise.

## 2011-11-21 NOTE — Telephone Encounter (Signed)
Pt wants to know if you will give her anything for pain

## 2011-11-22 MED ORDER — MELOXICAM 7.5 MG PO TABS: 7.5000 mg | ORAL_TABLET | Freq: Two times a day (BID) | ORAL | Status: DC

## 2011-11-22 NOTE — Telephone Encounter (Signed)
Pt aware, med list updated, rx sent in electronically to [facility]

## 2011-11-22 NOTE — Telephone Encounter (Signed)
Can d/c naproxen and try meloxicam 7.5 mg po bid for one month

## 2011-12-06 ENCOUNTER — Ambulatory Visit (INDEPENDENT_AMBULATORY_CARE_PROVIDER_SITE_OTHER): Payer: 59 | Admitting: Family

## 2011-12-06 ENCOUNTER — Telehealth: Payer: Self-pay | Admitting: Internal Medicine

## 2011-12-06 ENCOUNTER — Encounter: Payer: Self-pay | Admitting: Family

## 2011-12-06 DIAGNOSIS — G43909 Migraine, unspecified, not intractable, without status migrainosus: Secondary | ICD-10-CM

## 2011-12-06 DIAGNOSIS — F419 Anxiety disorder, unspecified: Secondary | ICD-10-CM

## 2011-12-06 DIAGNOSIS — F411 Generalized anxiety disorder: Secondary | ICD-10-CM

## 2011-12-06 MED ORDER — TOPIRAMATE 100 MG PO TABS: 100.0000 mg | ORAL_TABLET | Freq: Two times a day (BID) | ORAL | Status: DC

## 2011-12-06 MED ORDER — KETOROLAC TROMETHAMINE 30 MG/ML IJ SOLN
60.0000 mg | Freq: Once | INTRAMUSCULAR | Status: AC
  Administered 2011-12-06: 60 mg via INTRAMUSCULAR

## 2011-12-06 MED ORDER — LORAZEPAM 1 MG PO TABS: 1.0000 mg | ORAL_TABLET | Freq: Two times a day (BID) | ORAL | Status: AC | PRN

## 2011-12-06 MED ORDER — TRAMADOL HCL 50 MG PO TABS: 50.0000 mg | ORAL_TABLET | Freq: Three times a day (TID) | ORAL | Status: AC | PRN

## 2011-12-06 NOTE — Telephone Encounter (Signed)
ok 

## 2011-12-06 NOTE — Progress Notes (Signed)
Subjective:    Patient ID: Cynthia [name], female    DOB: May 16, 1971, 41 y.o.   MRN: [id number]  HPI Comments: 41 yo white female presents with c/o migraine headache started this am with photophobia and nausea. Pain is not relieved with lying down quietly in dark room. Pain is described as throbbing 8.5/10. Went to urgent care at [facility] last week and was given a phenergan injection "it knocked me out for 2 days." Stating was previously prescribed topamax 100mg  and ran out in Dec of this year. Stating topamax 100mg  effective at first and then later was ineffective. Requesting to increase topamax dose to 200mg  every day. Requesting med refill anxiety.   Migraine  Pertinent negatives include no dizziness, numbness, seizures or weakness.      Review of Systems  Constitutional: Negative.   Respiratory: Negative.   Cardiovascular: Negative.   Neurological: Positive for headaches. Negative for dizziness, tremors, seizures, syncope, facial asymmetry, speech difficulty, weakness, light-headedness and numbness.   Past Medical History  Diagnosis Date  . Anxiety and depression 04/03/2008  . Acne   . Hemorrhoids     History   Social History  . Marital Status: Married    Spouse Name: N/A    Number of Children: N/A  . Years of Education: N/A   Occupational History  . Not on file.   Social History Main Topics  . Smoking status: Former Smoker    Quit date: 08/15/2003  . Smokeless tobacco: Never Used  . Alcohol Use: Yes     socially  . Drug Use: No  . Sexually Active: Not on file   Other Topics Concern  . Not on file   Social History Narrative  . No narrative on file    Past Surgical History  Procedure Date  . Partial hysterectomy   . Bunionectomy   . Cesarean section     x 2    Family History  Problem Relation Age of Onset  . Prostate cancer Maternal Grandfather   . Kidney disease Sister   . Heart disease Paternal Grandfather   . Depression      Allergies    Allergen Reactions  . Doxycycline Hyclate     REACTION: heartburn  . Meclizine Hcl     REACTION: urticaria (hives)  . Morphine   . Naproxen Sodium     REACTION: swelling,hives    Current Outpatient Prescriptions on File Prior to Visit  Medication Sig Dispense Refill  . ALPRAZolam (XANAX) 0.5 MG tablet Take 1 tablet (0.5 mg total) by mouth every other day.  10 tablet  1  . diphenhydramine-acetaminophen (TYLENOL PM) 25-500 MG TABS Take 1 tablet by mouth at bedtime as needed.        . DULoxetine (CYMBALTA) 30 MG capsule Take 1 capsule (30 mg total) by mouth daily. 1 tab po qd x 7 days then increase to 2 tabs.  60 capsule  3  . HYDROcodone-acetaminophen (NORCO) 5-325 MG per tablet Take 1 tablet by mouth every 6 (six) hours as needed.  30 tablet  1  . meloxicam (MOBIC) 7.5 MG tablet Take 1 tablet (7.5 mg total) by mouth 2 (two) times daily.  60 tablet  0  . metaxalone (SKELAXIN) 800 MG tablet as needed.       . sertraline (ZOLOFT) 50 MG tablet TAKE ONE TABLET BY MOUTH DAILY  30 tablet  5  . sulfamethoxazole-trimethoprim (BACTRIM DS) 800-160 MG per tablet Take 1 tablet by mouth 2 (two) times daily.       Marland Kitchen  DISCONTD: topiramate (TOPAMAX) 100 MG tablet as needed.        No current facility-administered medications on file prior to visit.    BP 96/58  Temp(Src) 98.1 F (36.7 C) (Oral)  Wt 137 lb (62.143 kg)chart    Objective:   Physical Exam  Constitutional: She is oriented to person, place, and time. She appears well-developed and well-nourished. No distress.  HENT:  Right Ear: External ear normal.  Left Ear: External ear normal.  Nose: Nose normal.  Mouth/Throat: Oropharynx is clear and moist. No oropharyngeal exudate.  Cardiovascular: Normal rate, regular rhythm, normal heart sounds and intact distal pulses.  Exam reveals no gallop and no friction rub.   No murmur heard. Pulmonary/Chest: Effort normal and breath sounds normal. No respiratory distress. She has no wheezes. She has  no rales. She exhibits no tenderness.  Neurological: She is alert and oriented to person, place, and time.  Skin: Skin is warm and dry. She is not diaphoretic.  Psychiatric: She has a normal mood and affect. Her behavior is normal. Judgment and thought content normal.          Assessment & Plan:  Assessment: Migraine, anxiety Plan: Topamax, toradol IM, ativan. Teaching handout on diagnosis and treatment provided. Encouraged to RTC if s/s get worse. Opportunity for questions provided.

## 2011-12-06 NOTE — Telephone Encounter (Signed)
Pt called and is req change of pcp from Dr Cato Mulligan to Adline Mango, due to availability. Pls advise if ok.

## 2011-12-06 NOTE — Patient Instructions (Signed)

## 2011-12-06 NOTE — Telephone Encounter (Signed)
Pt's mailbox is full.

## 2011-12-06 NOTE — Telephone Encounter (Signed)
As long as its ok with Dr. Cato Mulligan, I am ok.

## 2011-12-08 NOTE — Telephone Encounter (Signed)
Pts vm box is still full. Could not lv a message.

## 2011-12-13 ENCOUNTER — Emergency Department (HOSPITAL_COMMUNITY): Payer: Worker's Compensation

## 2011-12-13 ENCOUNTER — Encounter (HOSPITAL_COMMUNITY): Payer: Self-pay | Admitting: *Deleted

## 2011-12-13 ENCOUNTER — Emergency Department (HOSPITAL_COMMUNITY)
Admission: EM | Admit: 2011-12-13 | Discharge: 2011-12-13 | Disposition: A | Discharge status: Home / Self Care | Payer: Worker's Compensation | Attending: Emergency Medicine | Admitting: Emergency Medicine

## 2011-12-13 DIAGNOSIS — M25529 Pain in unspecified elbow: Secondary | ICD-10-CM | POA: Insufficient documentation

## 2011-12-13 DIAGNOSIS — Y92410 Unspecified street and highway as the place of occurrence of the external cause: Secondary | ICD-10-CM | POA: Insufficient documentation

## 2011-12-13 DIAGNOSIS — F329 Major depressive disorder, single episode, unspecified: Secondary | ICD-10-CM | POA: Insufficient documentation

## 2011-12-13 DIAGNOSIS — F3289 Other specified depressive episodes: Secondary | ICD-10-CM | POA: Insufficient documentation

## 2011-12-13 DIAGNOSIS — M545 Low back pain, unspecified: Secondary | ICD-10-CM | POA: Insufficient documentation

## 2011-12-13 DIAGNOSIS — S335XXA Sprain of ligaments of lumbar spine, initial encounter: Secondary | ICD-10-CM | POA: Insufficient documentation

## 2011-12-13 DIAGNOSIS — F411 Generalized anxiety disorder: Secondary | ICD-10-CM | POA: Insufficient documentation

## 2011-12-13 DIAGNOSIS — S0003XA Contusion of scalp, initial encounter: Secondary | ICD-10-CM | POA: Insufficient documentation

## 2011-12-13 DIAGNOSIS — S5000XA Contusion of unspecified elbow, initial encounter: Secondary | ICD-10-CM

## 2011-12-13 DIAGNOSIS — S1093XA Contusion of unspecified part of neck, initial encounter: Secondary | ICD-10-CM | POA: Insufficient documentation

## 2011-12-13 DIAGNOSIS — S0093XA Contusion of unspecified part of head, initial encounter: Secondary | ICD-10-CM

## 2011-12-13 DIAGNOSIS — R51 Headache: Secondary | ICD-10-CM | POA: Insufficient documentation

## 2011-12-13 DIAGNOSIS — M25429 Effusion, unspecified elbow: Secondary | ICD-10-CM | POA: Insufficient documentation

## 2011-12-13 DIAGNOSIS — S39012A Strain of muscle, fascia and tendon of lower back, initial encounter: Secondary | ICD-10-CM

## 2011-12-13 DIAGNOSIS — M25569 Pain in unspecified knee: Secondary | ICD-10-CM | POA: Insufficient documentation
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
## 2011-12-13 NOTE — Discharge Instructions (Signed)
Take your ultram for pain and follow up with your provider as needed
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
## 2012-01-02 ENCOUNTER — Ambulatory Visit (INDEPENDENT_AMBULATORY_CARE_PROVIDER_SITE_OTHER): Payer: 59 | Admitting: Neurology

## 2012-01-02 ENCOUNTER — Encounter: Payer: Self-pay | Admitting: Family

## 2012-01-02 ENCOUNTER — Encounter: Payer: Self-pay | Admitting: Neurology

## 2012-01-02 ENCOUNTER — Ambulatory Visit (INDEPENDENT_AMBULATORY_CARE_PROVIDER_SITE_OTHER): Payer: 59 | Admitting: Family

## 2012-01-02 VITALS — BP 84/56 | HR 80 | Wt 134.0 lb

## 2012-01-02 VITALS — BP 90/60 | Wt 134.0 lb

## 2012-01-02 DIAGNOSIS — G478 Other sleep disorders: Secondary | ICD-10-CM

## 2012-01-02 DIAGNOSIS — F513 Sleepwalking [somnambulism]: Secondary | ICD-10-CM | POA: Insufficient documentation

## 2012-01-02 DIAGNOSIS — R51 Headache: Secondary | ICD-10-CM

## 2012-01-02 DIAGNOSIS — F418 Other specified anxiety disorders: Secondary | ICD-10-CM

## 2012-01-02 DIAGNOSIS — R5381 Other malaise: Secondary | ICD-10-CM

## 2012-01-02 DIAGNOSIS — F341 Dysthymic disorder: Secondary | ICD-10-CM

## 2012-01-02 DIAGNOSIS — R5383 Other fatigue: Secondary | ICD-10-CM

## 2012-01-02 DIAGNOSIS — G43909 Migraine, unspecified, not intractable, without status migrainosus: Secondary | ICD-10-CM

## 2012-01-02 MED ORDER — TIZANIDINE HCL 2 MG PO CAPS: 2.0000 mg | ORAL_CAPSULE | Freq: Two times a day (BID) | ORAL | Status: DC

## 2012-01-02 MED ORDER — PROCHLORPERAZINE MALEATE 5 MG PO TABS
5.0000 mg | ORAL_TABLET | Freq: Four times a day (QID) | ORAL | Status: DC | PRN
Start: 2012-01-02 — End: 2012-01-30

## 2012-01-02 MED ORDER — DULOXETINE HCL 60 MG PO CPEP: 60.0000 mg | ORAL_CAPSULE | Freq: Two times a day (BID) | ORAL | Status: DC
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
## 2012-01-02 NOTE — Patient Instructions (Signed)
[facility]'s phone number is [phone].

## 2012-01-04 ENCOUNTER — Ambulatory Visit
Admission: RE | Admit: 2012-01-04 | Discharge: 2012-01-04 | Disposition: A | Discharge status: Home / Self Care | Payer: 59 | Source: Ambulatory Visit | Attending: Neurology | Admitting: Neurology

## 2012-01-04 ENCOUNTER — Telehealth: Payer: Self-pay

## 2012-01-04 NOTE — Telephone Encounter (Signed)
Triage VM:  Pt states she was calling to let NP Orvan Falconer know that she had her MRI this morning.  Pt would like results.    Returned pt's call and left a voicemail on personalized voicemail that NP is out of the office and results would be sent to another physician for review.  Pls advise on results.

## 2012-01-04 NOTE — Telephone Encounter (Signed)
MRI of brain no acute findings

## 2012-01-05 ENCOUNTER — Telehealth: Payer: Self-pay | Admitting: *Deleted

## 2012-01-05 NOTE — Telephone Encounter (Signed)
Left detailed message on machine for patient 

## 2012-01-05 NOTE — Telephone Encounter (Signed)
Patient is calling because she understands that her MRI was normal.  She would like to know if she should have a sleep study or further testing.

## 2012-01-09 ENCOUNTER — Telehealth: Payer: Self-pay | Admitting: Neurology

## 2012-01-09 NOTE — Telephone Encounter (Signed)
Left the patient a message stating MRI brain was normal and to call if questions.

## 2012-01-09 NOTE — Telephone Encounter (Signed)
Yes a sleep study is recommended.

## 2012-01-09 NOTE — Telephone Encounter (Signed)
Referral placed for pulmonology.   

## 2012-01-09 NOTE — Telephone Encounter (Signed)
Message copied by Benay Spice on Tue Jan 09, 2012 11:05 AM ------      Message from: Milas Gain      Created: Mon Jan 08, 2012 10:52 PM       Let Ms. Couvillon know her MRI was normal.

## 2012-01-10 ENCOUNTER — Telehealth: Payer: Self-pay | Admitting: Family

## 2012-01-10 NOTE — Telephone Encounter (Signed)
Pt aware of referral to pulmonology for narcolepsy

## 2012-01-10 NOTE — Telephone Encounter (Signed)
Patient called stating that the NP took her down to 15 mins of driving pending MRI results and her MRI has come back normal and she would like to know what her next step is. Please advise.

## 2012-01-30 ENCOUNTER — Encounter: Payer: Self-pay | Admitting: Pulmonary Disease

## 2012-01-30 ENCOUNTER — Ambulatory Visit (INDEPENDENT_AMBULATORY_CARE_PROVIDER_SITE_OTHER): Payer: 59 | Admitting: Pulmonary Disease

## 2012-01-30 VITALS — BP 92/58 | HR 61 | Temp 98.3°F | Ht 61.0 in | Wt 135.8 lb

## 2012-01-30 DIAGNOSIS — G4710 Hypersomnia, unspecified: Secondary | ICD-10-CM

## 2012-01-30 NOTE — Assessment & Plan Note (Signed)
The patient has significant daytime hypersomnia of unknown origin.  I have discussed with her the differential diagnosis, including poor sleep hygiene, medications, nocturnal sleep disorders, and finally daytime sleep disorders.  I would find it unlikely that she has narcolepsy or idiopathic hypersomnia, however can not exclude.  Her sleep hygiene and sedating medications may be her biggest issues.  She does have a history of snoring and sleep fragmentation that may suggest sleep apnea.  At this point, I have discussed with her proceeding with nocturnal polysomnography, followed by multiple sleep latency testing if it is unremarkable.  She is agreeable to this.  She will need to be off Cymbalta for the next 2 weeks, and I will also have her fill out sleep logs during his time to evaluate her sleep hygiene.
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
## 2012-01-30 NOTE — Patient Instructions (Addendum)
You need to stop cymbalta for 2 weeks before your sleep studies.   Fill out sleep logs for 2 weeks leading up to sleep study, and bring with you to give to sleep tech. Try and avoid sedating meds as much as possible except your topamax Will arrange followup to discuss results once available.

## 2012-02-19 ENCOUNTER — Encounter: Payer: Self-pay | Admitting: Family

## 2012-02-19 ENCOUNTER — Ambulatory Visit (INDEPENDENT_AMBULATORY_CARE_PROVIDER_SITE_OTHER): Payer: 59 | Admitting: Family

## 2012-02-19 ENCOUNTER — Telehealth: Payer: Self-pay | Admitting: Family

## 2012-02-19 VITALS — BP 100/80 | Temp 98.1°F | Wt 134.0 lb

## 2012-02-19 DIAGNOSIS — F419 Anxiety disorder, unspecified: Secondary | ICD-10-CM

## 2012-02-19 DIAGNOSIS — F411 Generalized anxiety disorder: Secondary | ICD-10-CM

## 2012-02-19 DIAGNOSIS — F329 Major depressive disorder, single episode, unspecified: Secondary | ICD-10-CM

## 2012-02-19 MED ORDER — LORAZEPAM 1 MG PO TABS: 1.0000 mg | ORAL_TABLET | Freq: Three times a day (TID) | ORAL | Status: DC | PRN

## 2012-02-19 MED ORDER — DULOXETINE HCL 60 MG PO CPEP: 120.0000 mg | ORAL_CAPSULE | Freq: Every day | ORAL | Status: DC

## 2012-02-19 NOTE — Patient Instructions (Addendum)

## 2012-02-19 NOTE — Progress Notes (Signed)
Subjective:    Patient ID: Cynthia [name], female    DOB: [birth year], 41 y.o.   MRN: [id number]  HPI 41 year old white female, nonsmoker is in today with complaints of anxiety and depression after being taken off of her Cymbalta 120 mg once a day. She is due to have a sleep study performed in the physician to call for for medication x2 weeks. She does not have any anti-anxiety medication at all. In the past she's been on Ativan 1 mg as needed. Patient reports feeling edgy, cloudy head, and having crying spells. As of note, patient has a husband has bipolar and is unstable. She's also pending separation from her marriage. Denies any thoughts of death or dying.   Review of Systems  Constitutional: Negative.   Respiratory: Negative.   Cardiovascular: Negative.   Gastrointestinal: Negative.   Neurological: Negative.   Hematological: Negative.   Psychiatric/Behavioral: Positive for disturbed wake/sleep cycle and agitation. The patient is nervous/anxious and is hyperactive.    Past Medical History  Diagnosis Date  . Anxiety and depression 04/03/2008  . Acne   . Hemorrhoids     History   Social History  . Marital Status: Married    Spouse Name: N/A    Number of Children: N/A  . 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 Smoking status: Former Smoker -- 1.0 packs/day for 10 years    Types: Cigarettes    Quit date: 08/15/2003  . Smokeless tobacco: Never Used  . Alcohol Use: No     socially  . Drug Use: No  . Sexually Active: Not on file   Other Topics Concern  . Not on file   Social History Narrative  . No narrative on file    Past Surgical History  Procedure Date  . Partial hysterectomy   . Bunionectomy   . Cesarean section     x 2    Family History  Problem Relation Age of Onset  . Prostate cancer Maternal Grandfather   . Kidney disease Sister   . Heart disease Paternal Grandfather   . Depression       Allergies  Allergen Reactions  . Doxycycline Hyclate     REACTION: heartburn  . Meclizine Hcl     REACTION: urticaria (hives)  . Morphine   . Naproxen Sodium     REACTION: swelling,hives Per pt can now take with no reaction. May 2013    Current Outpatient Prescriptions on File Prior to Visit  Medication Sig Dispense Refill  . Nutritional Supplements (MELATONIN PO) Take by mouth at bedtime.      . prochlorperazine (COMPAZINE) 5 MG tablet Take 5 mg by mouth every 6 (six) hours as needed.      . sulfamethoxazole-trimethoprim (BACTRIM DS) 800-160 MG per tablet Take 1 tablet by mouth 2 (two) times daily.       . tizanidine (ZANAFLEX) 2 MG capsule Take 1 capsule (2 mg total) by mouth 2 (two) times daily.  60 capsule  1  . topiramate (TOPAMAX) 100 MG tablet Take 100 mg by mouth daily.      . traMADol (ULTRAM) 50 MG tablet Take 50 mg by mouth every 8 (eight) hours as needed.      Marland Kitchen DISCONTD: DULoxetine (CYMBALTA) 60 MG capsule Take 120 mg by mouth at bedtime.        BP 100/80  Temp 98.1 F (36.7 C) (Oral)  Wt 134  lb (60.782 kg)chart    Objective:   Physical Exam  Constitutional: She is oriented to person, place, and time. She appears well-developed and well-nourished.  Neck: Normal range of motion. Neck supple. No thyromegaly present.  Cardiovascular: Normal rate, regular rhythm and normal heart sounds.   Pulmonary/Chest: Effort normal and breath sounds normal.  Neurological: She is alert and oriented to person, place, and time.  Skin: Skin is warm and dry.  Psychiatric: She has a normal mood and affect.          Assessment & Plan:  Assessment: Anxiety, depression  Plan: Ativan 1 mg 3 times a day as needed when necessary anxiety. Have sleep study done on Monday. Resume Cymbalta thereafter at 60 mg, then increase to 120 mg over a week. Patient advised to call the office if her symptoms worsen or persist. Recheck a schedule, appearing.

## 2012-02-19 NOTE — Telephone Encounter (Signed)
Call regarding pt extremely depressed after Dr. Clemmie Krill suddenly stopped pt's Rx for Cymbalta d/t upcoming sleep study scheduled for 7/15;  pt is extremely depressed, "in a fog" and very tearful, and is very snippy with family.  Feels like she is unable to cope/function.  All emergent sxs r/o per Depression Guideline except for "Unable to Cope".  Appt scheduled with Dr. Orvan Falconer 7/8 at 3:20 p.m. per triage disposition to see provider within 24 hours.

## 2012-02-26 ENCOUNTER — Ambulatory Visit (HOSPITAL_BASED_OUTPATIENT_CLINIC_OR_DEPARTMENT_OTHER): Payer: 59 | Attending: Pulmonary Disease | Admitting: Radiology

## 2012-02-26 VITALS — Ht 62.0 in | Wt 134.0 lb

## 2012-02-26 DIAGNOSIS — G4710 Hypersomnia, unspecified: Secondary | ICD-10-CM | POA: Insufficient documentation

## 2012-02-26 DIAGNOSIS — G4733 Obstructive sleep apnea (adult) (pediatric): Secondary | ICD-10-CM

## 2012-02-27 ENCOUNTER — Ambulatory Visit (HOSPITAL_BASED_OUTPATIENT_CLINIC_OR_DEPARTMENT_OTHER): Payer: 59 | Attending: Pulmonary Disease | Admitting: Radiology

## 2012-02-27 DIAGNOSIS — G47419 Narcolepsy without cataplexy: Secondary | ICD-10-CM | POA: Insufficient documentation

## 2012-02-27 DIAGNOSIS — G4710 Hypersomnia, unspecified: Secondary | ICD-10-CM | POA: Insufficient documentation

## 2012-02-28 ENCOUNTER — Telehealth: Payer: Self-pay | Admitting: Family
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
## 2012-02-28 NOTE — Telephone Encounter (Signed)
Letter printed to be faxed to # provided by pt

## 2012-03-02 DIAGNOSIS — G47419 Narcolepsy without cataplexy: Secondary | ICD-10-CM

## 2012-03-02 DIAGNOSIS — G4710 Hypersomnia, unspecified: Secondary | ICD-10-CM

## 2012-03-03 NOTE — Procedures (Signed)
NAMEJERRELL, HART               ACCOUNT NO.:  [id number]  MEDICAL RECORD NO.:  [id number]          PATIENT TYPE:  OUT  LOCATION:  SLEEP CENTER                 FACILITY:  [facility]  PHYSICIAN:  Barbaraann Share, MD,FCCPDATE OF BIRTH:  08-22-72  DATE OF STUDY:                           NOCTURNAL POLYSOMNOGRAM  REFERRING PHYSICIAN:  Barbaraann Share, MD,FCCP  LOCATION:  Sleep lab.  INDICATION FOR STUDY:  Hypersomnia with sleep apnea.  EPWORTH SLEEPINESS SCORE:  20.  SLEEP ARCHITECTURE:  The patient had a total sleep time of 444 minutes with no slow-wave sleep and a large quantity of REM at 156 minutes. Sleep onset latency was normal at 6 minutes and REM onset was rapid 18 minutes.  Sleep efficiency was excellent at 96%.  RESPIRATORY DATA:  The patient was found to have 6 apneas and 2 obstructive hypopneas, giving her an apnea-hypopnea index of 1 event per hour.  There was mild snoring noted throughout, and most of her events occurred in the supine position.  OXYGEN DATA:  There was O2 desaturation as low as 91% with the patient's obstructive events.  These were transient in nature.  CARDIAC DATA:  No clinically significant arrhythmias were noted.  MOVEMENTS-PARASOMNIA:  The patient had no significant leg jerks or other abnormal behavior seen.  IMPRESSION-RECOMMENDATIONS: 1. Small numbers of obstructive events, which do not meet the AHI     criteria for the obstructive sleep apnea syndrome. 2. No significant abnormalities noted during the study to explain the     patient's complaint of severe daytime hypersomnia.  Clinical     correlation is suggested.    Barbaraann Share, MD,FCCP Diplomate, American Board of Sleep Medicine   K[facility]/MEDQ  D:  03/02/2012 17:18:10  T:  03/03/2012 01:54:36  Job:  [id number]

## 2012-03-03 NOTE — Procedures (Signed)
Cynthia [name], Cynthia [name]               ACCOUNT NO.:  [id number]  MEDICAL RECORD NO.:  [id number]          PATIENT TYPE:  OUT  LOCATION:  SLEEP CENTER                 FACILITY:  [facility]  PHYSICIAN:  Barbaraann Share, MD,FCCPDATE OF BIRTH:  07[birth year]08-1973  DATE OF STUDY:  02/27/2012                         MULTIPLE SLEEP LATENCY TEST  REFERRING PHYSICIAN:  Barbaraann Share, MD,FCCP  LOCATION:  Sleep lab.  INDICATION FOR STUDY:  Hypersomnia and narcolepsy without cataplexy.  EPWORTH SLEEPINESS SCORE:  20.  BMI:  MEDICATIONS:  NAP 1:  Sleep onset latency 2.25 minutes.  REM latency, N/A.  NAP 2:  Sleep onset latency 4.0 minutes.  REM latency 15 minutes.  NAP 3:  Sleep onset latency 3.0 minutes.  REM latency 14.5 minutes.  NAP 4:  Sleep onset latency 3.0 minutes.  REM latency of 12.0.  NAP 5:  Sleep onset latency 4.0 minutes.  REM latency 14.5 minutes. Mean sleep latency is 3.25 minutes.   MEAN SLEEP LATENCY:  3.25 minutes.  NUMBER OF REM EPISODES:  4 REM periods.  COMMENTS: 1. The patient underwent a nocturnal polysomnogram the night before     her MSLT, where she was found to have no sleep-disordered breathing or     other explanation for her daytime hypersomnia. 2. The patient discontinued her usual Cymbalta 2 weeks prior to having     her MSLT. 3. The patient filled out sleep logs for 2 weeks leading up to her     study, and showed adequate sleep quantity.  IMPRESSIONS-RECOMMENDATIONS:  Clinically significant objective daytime sleepiness with a mean sleep latency of 3.25 minutes, and 4 REM periods noted during the study.  This does not appear to be related to sleep- disordered breathing or other nocturnal issues.  Clinical correlation is suggested.     Barbaraann Share, MD,FCCP Diplomate, American Board of Sleep Medicine    KMC/MEDQ  D:  03/02/2012 17:43:00  T:  03/03/2012 03:04:27  Job:  [id number]

## 2012-03-04 ENCOUNTER — Telehealth: Payer: Self-pay | Admitting: Family

## 2012-03-04 NOTE — Telephone Encounter (Signed)
It appears she has no sleep apnea per the sleep study. Only daytime drowsiness..Narcolepsy. We can offer her medication to help daytime drowsiness like Provigil or Nuvigil. Schedule OV to discuss options. Otherwise, her driving privileges will remain limited.

## 2012-03-04 NOTE — Telephone Encounter (Signed)
I have faxed the note as discussed. Not sure what else she needs from me at this point. We are awaiting sleep study results from her sleep doctor. I called patient and left a voice message. She can leave specific concerns with [facility] or the front staff. We will not make any changes to driving privileges until we have the sleep study results.

## 2012-03-04 NOTE — Telephone Encounter (Signed)
Left detailed message on personally identified voicemail to notify pt of Padonda's note. Advised pt to schedule OV

## 2012-03-04 NOTE — Telephone Encounter (Signed)
Caller: Ailanie/Patient; PCP: Adline Mango; CB#: [phone]; ; ; Call regarding Follow Up Regarding Letter To DMV; advised per Epic that letter was sent 02/28/12.  States her sleep study is pending.  Currently [name] has limited patient's driving time to 15 min at a time.  Wants to know if that is to be modified; expected that [name] would have called back regarding this.  INFO TO OFFICE FOR PROVIDER REVIEW/CALLBACK. MAY REACH PATIENT AT [phone].

## 2012-03-07 ENCOUNTER — Encounter: Payer: Self-pay | Admitting: Family

## 2012-03-07 ENCOUNTER — Ambulatory Visit (INDEPENDENT_AMBULATORY_CARE_PROVIDER_SITE_OTHER): Payer: 59 | Admitting: Family

## 2012-03-07 VITALS — BP 84/60 | HR 95 | Temp 98.6°F

## 2012-03-07 DIAGNOSIS — F3289 Other specified depressive episodes: Secondary | ICD-10-CM

## 2012-03-07 DIAGNOSIS — F32A Depression, unspecified: Secondary | ICD-10-CM

## 2012-03-07 DIAGNOSIS — G47419 Narcolepsy without cataplexy: Secondary | ICD-10-CM

## 2012-03-07 DIAGNOSIS — F329 Major depressive disorder, single episode, unspecified: Secondary | ICD-10-CM

## 2012-03-07 MED ORDER — ARMODAFINIL 50 MG PO TABS: 50.0000 mg | ORAL_TABLET | Freq: Every day | ORAL | Status: DC

## 2012-03-07 NOTE — Progress Notes (Signed)
Subjective:    Patient ID: Cynthia [name], female    DOB: [birth year], 41 y.o.   MRN: [id number]  HPI 40 year old WF is in today for follow-up of daytime drowsiness. She has had a sleep study performed, but did not show any signs of sleep apnea. She has had her driving privileges revoked by [facility] she is requesting to start medication today to help with her narcoleptic disorder.   Review of Systems  Constitutional: Negative.   Respiratory: Negative.   Cardiovascular: Negative.   Gastrointestinal: Negative.   Neurological: Negative.   Hematological: Negative.   Psychiatric/Behavioral: Negative.    Past Medical History  Diagnosis Date  . Anxiety and depression 04/03/2008  . Acne   . Hemorrhoids     History   Social History  . Marital Status: Married    Spouse Name: N/A    Number of Children: N/A  . 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 Smoking status: Former Smoker -- 1.0 packs/day for 10 years    Types: Cigarettes    Quit date: 08/15/2003  . Smokeless tobacco: Never Used  . Alcohol Use: No     socially  . Drug Use: No  . Sexually Active: Not on file   Other Topics Concern  . Not on file   Social History Narrative  . No narrative on file    Past Surgical History  Procedure Date  . Partial hysterectomy   . Bunionectomy   . Cesarean section     x 2    Family History  Problem Relation Age of Onset  . Prostate cancer Maternal Grandfather   . Kidney disease Sister   . Heart disease Paternal Grandfather   . Depression      Allergies  Allergen Reactions  . Doxycycline Hyclate     REACTION: heartburn  . Meclizine Hcl     REACTION: urticaria (hives)  . Morphine   . Naproxen Sodium     REACTION: swelling,hives Per pt can now take with no reaction. May 2013    Current Outpatient Prescriptions on File Prior to Visit  Medication Sig Dispense Refill  . DULoxetine (CYMBALTA) 60 MG  capsule Take 2 capsules (120 mg total) by mouth at bedtime.  60 capsule  3  . Nutritional Supplements (MELATONIN PO) Take by mouth at bedtime.      . prochlorperazine (COMPAZINE) 5 MG tablet Take 5 mg by mouth every 6 (six) hours as needed.      . sulfamethoxazole-trimethoprim (BACTRIM DS) 800-160 MG per tablet Take 1 tablet by mouth 2 (two) times daily.       . tizanidine (ZANAFLEX) 2 MG capsule Take 1 capsule (2 mg total) by mouth 2 (two) times daily.  60 capsule  1  . topiramate (TOPAMAX) 100 MG tablet Take 100 mg by mouth daily.      . traMADol (ULTRAM) 50 MG tablet Take 50 mg by mouth every 8 (eight) hours as needed.      . Armodafinil (NUVIGIL) 50 MG tablet Take 1 tablet (50 mg total) by mouth daily.  30 tablet  0    BP 84/60  Pulse 95  Temp 98.6 F (37 C) (Oral)  SpO2 99%chart    Objective:   Physical Exam  Constitutional: She appears well-developed and well-nourished.  Neck: Normal range of motion. Neck supple.  Cardiovascular: Normal rate, regular rhythm and normal heart sounds.  Pulmonary/Chest: Effort normal and breath sounds normal.  Skin: Skin is warm and dry.          Assessment & Plan:  Assessment: Narcolepsy, depression  Plan: Start Nuvigil 50 mg once daily. We'll bring patient back in about 7-10 days for reevaluation. She will continue to have a chaperone when she is driving. She's better at her next office visit we'll consider lifting herdriving prescriptions.

## 2012-03-07 NOTE — Patient Instructions (Signed)
Recheck in 1 week  Narcolepsy Narcolepsy is a disabling neurological disorder of sleep regulation. It affects the control of sleep. It also affects the control of wakefulness. It is an interruption of the dreaming state of sleep. This state is known as REM or rapid eye movement sleep.  SYMPTOMS  The development, number, and severity of symptoms vary widely among people with the disorder. Symptoms generally begin between the ages of 31 and 57. The four classic symptoms of the disorder are:   Excessive daytime sleepiness.   Cataplexy. This is sudden, brief episodes of muscle weakness or paralysis. It is caused by strong emotions. Common strong emotions are laughter, anger, surprise, or anticipation.   Sleep paralysis. This is paralysis upon falling asleep or waking up.   Hallucinations. These are vivid dream-like images that occur at when you first fall asleep.  Other symptoms include:   Unrelenting excessive sleepiness. This is usually the first and most obvious symptom.   Sleep attacks. Patients have strong sleep attacks throughout the day. These attacks can last for 30 seconds to more than 30 minutes. These happen no matter how much or how well the person slept the night before. These attacks end up making the person sleep at work and social events. The person can fall asleep while eating, talking, and driving. They also fall asleep at other out of place times.   Disturbed nighttime sleep.   Tossing and turning in bed.   Leg jerks.   Nightmares.   Waking up often.  DIAGNOSIS  It's possible that genetics play a role in this disorder. Narcolepsy is not a rare disorder. It is often misdiagnosed. It is often diagnosed years after symptoms first appear. Early diagnosis and treatment are important. This help the physical and mental well-being of the patient. TREATMENT  There is no cure for narcolepsy. The symptoms can be controlled with behavioral and medical therapy. The excessive daytime  sleepiness may be treated with stimulant drugs. It may also be treated with the drug modafinil (Provigil). Cataplexy and other REM-sleep symptoms may be treated with antidepressant medications. Medications will reduce the symptoms. Medications will not ease symptoms entirely. Many available medications have side effects. Basic lifestyle changes may also reduce the symptoms. These changes include having regular sleep schedules and scheduled daytime naps. Other lifestyle changes include avoiding "over-stimulating" situations. Document Released: 07/21/2002 Document Revised: 07/20/2011 Document Reviewed: 07/31/2005 [facility] Patient Information 2012 [place], [place].

## 2012-03-15 ENCOUNTER — Ambulatory Visit: Payer: Self-pay | Admitting: Neurology

## 2012-03-18 ENCOUNTER — Ambulatory Visit (INDEPENDENT_AMBULATORY_CARE_PROVIDER_SITE_OTHER): Payer: 59 | Admitting: Family

## 2012-03-18 ENCOUNTER — Encounter: Payer: Self-pay | Admitting: Family

## 2012-03-18 VITALS — BP 88/58 | HR 68 | Wt 134.0 lb

## 2012-03-18 DIAGNOSIS — G47419 Narcolepsy without cataplexy: Secondary | ICD-10-CM

## 2012-03-18 DIAGNOSIS — F329 Major depressive disorder, single episode, unspecified: Secondary | ICD-10-CM

## 2012-03-18 NOTE — Progress Notes (Signed)
Subjective:    Patient ID: Cynthia [name], female    DOB: 12-25-1970, 41 y.o.   MRN: [id number]  HPI 41 year old white female, smoker, is in for recheck on narcolepsy. She was started on Nuvigil 50 mg once daily. She's tolerating medication well. Denies any daytime drowsiness. She's much more energetic during the day. She's here to have her driving restrictions released.   Review of Systems  Constitutional: Negative.   HENT: Negative.   Respiratory: Negative.   Cardiovascular: Negative.   Gastrointestinal: Negative.   Musculoskeletal: Negative.   Neurological: Negative.   Hematological: Negative.   Psychiatric/Behavioral: Negative.    Past Medical History  Diagnosis Date  . Anxiety and depression 04/03/2008  . Acne   . Hemorrhoids     History   Social History  . Marital Status: Married    Spouse Name: N/A    Number of Children: N/A  . 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 Smoking status: Former Smoker -- 1.0 packs/day for 10 years    Types: Cigarettes    Quit date: 08/15/2003  . Smokeless tobacco: Never Used  . Alcohol Use: No     socially  . Drug Use: No  . Sexually Active: Not on file   Other Topics Concern  . Not on file   Social History Narrative  . No narrative on file    Past Surgical History  Procedure Date  . Partial hysterectomy   . Bunionectomy   . Cesarean section     x 2    Family History  Problem Relation Age of Onset  . Prostate cancer Maternal Grandfather   . Kidney disease Sister   . Heart disease Paternal Grandfather   . Depression      Allergies  Allergen Reactions  . Doxycycline Hyclate     REACTION: heartburn  . Meclizine Hcl     REACTION: urticaria (hives)  . Morphine   . Naproxen Sodium     REACTION: swelling,hives Per pt can now take with no reaction. May 2013    Current Outpatient Prescriptions on File Prior to Visit  Medication Sig Dispense Refill  .  Armodafinil (NUVIGIL) 50 MG tablet Take 1 tablet (50 mg total) by mouth daily.  30 tablet  0  . DULoxetine (CYMBALTA) 60 MG capsule Take 2 capsules (120 mg total) by mouth at bedtime.  60 capsule  3  . Nutritional Supplements (MELATONIN PO) Take by mouth at bedtime.      . prochlorperazine (COMPAZINE) 5 MG tablet Take 5 mg by mouth every 6 (six) hours as needed.      . sulfamethoxazole-trimethoprim (BACTRIM DS) 800-160 MG per tablet Take 1 tablet by mouth 2 (two) times daily.       . tizanidine (ZANAFLEX) 2 MG capsule Take 1 capsule (2 mg total) by mouth 2 (two) times daily.  60 capsule  1  . topiramate (TOPAMAX) 100 MG tablet Take 100 mg by mouth daily.      . traMADol (ULTRAM) 50 MG tablet Take 50 mg by mouth every 8 (eight) hours as needed.        BP 88/58  Pulse 68  Wt 134 lb (60.782 kg)chart     Objective:   Physical Exam  Constitutional: She is oriented to person, place, and time. She appears well-developed.  HENT:  Right Ear: External ear normal.  Left Ear: External ear normal.  Nose:  Nose normal.  Mouth/Throat: Oropharynx is clear and moist.  Neck: Normal range of motion. Neck supple.  Cardiovascular: Normal rate, regular rhythm and normal heart sounds.   Pulmonary/Chest: Effort normal and breath sounds normal.  Abdominal: Soft. Bowel sounds are normal.  Neurological: She is alert and oriented to person, place, and time.  Skin: Skin is warm and dry.  Psychiatric: She has a normal mood and affect.          Assessment & Plan:  Assessment: Narcolepsy  Plan: Continue current medication. We'll bring patient back for recheck in about 6 weeks and sooner when necessary. Restrictions lifted.

## 2012-03-27 ENCOUNTER — Ambulatory Visit: Payer: Self-pay | Admitting: Neurology

## 2012-04-08 ENCOUNTER — Telehealth: Payer: Self-pay | Admitting: Pulmonary Disease

## 2012-04-08 NOTE — Telephone Encounter (Signed)
Lawson Fiscal called the pt to get OV set to discuss sleep study. Pt set to see [facility] on Wed 04-10-12 at 3:30pm.Areya Lemmerman Yancey Flemings, CMA

## 2012-04-08 NOTE — Telephone Encounter (Signed)
[phone] returning call

## 2012-04-08 NOTE — Telephone Encounter (Signed)
LMTCB x 1 

## 2012-04-10 ENCOUNTER — Ambulatory Visit (INDEPENDENT_AMBULATORY_CARE_PROVIDER_SITE_OTHER): Payer: 59 | Admitting: Pulmonary Disease

## 2012-04-10 ENCOUNTER — Encounter: Payer: Self-pay | Admitting: Pulmonary Disease

## 2012-04-10 VITALS — BP 90/60 | HR 80 | Temp 98.3°F | Ht 62.0 in | Wt 138.0 lb

## 2012-04-10 DIAGNOSIS — G47419 Narcolepsy without cataplexy: Secondary | ICD-10-CM

## 2012-04-10 MED ORDER — ARMODAFINIL 150 MG PO TABS: 150.0000 mg | ORAL_TABLET | Freq: Every day | ORAL | Status: DC

## 2012-04-10 NOTE — Assessment & Plan Note (Signed)
The patient's sleep testing along with her history is consistent with narcolepsy.  A long discussion with her about the disorder, and have reviewed the various ways to minimize symptoms.  I have explained to her that we can never eliminate her sleepiness, but can try and manage it.  I would like to start her on individual at 150 mg a day, and also expressed the importance of getting at least 7 hours of sleep a night.  I've also encouraged her to try and get naps during the day, as well as moderate use of caffeine.  Finally, I have warned her about the interaction between nuvigil and birth control pills, and have reminded her of her moral responsibility to not drive if she is sleepy.

## 2012-04-10 NOTE — Progress Notes (Signed)
  Subjective:    Patient ID: Cynthia [name], female    DOB: August 03, 1971, 41 y.o.   MRN: [id number]  HPI The patient comes in today for followup after her recent sleep study, as part of an evaluation for hypersomnia of unknown origin.  Her sleep study did not show any evidence for sleep-disordered breathing, arrhythmias, neurologic issues, or a movement disorder.  Sleep diaries prior to her study showed adequate quantity of sleep leading up to her testing.  She was found to have a mean sleep latency of 3.25 minutes on her MS LT, and achieved REM in 4-5 naps.  I have reviewed the study with her in detail, and answered all of her questions.   Review of Systems  Constitutional: Negative for fever and unexpected weight change.  HENT: Negative for ear pain, nosebleeds, congestion, sore throat, rhinorrhea, sneezing, trouble swallowing, dental problem, postnasal drip and sinus pressure.   Eyes: Negative for redness and itching.  Respiratory: Negative for cough, chest tightness, shortness of breath and wheezing.   Cardiovascular: Negative for palpitations and leg swelling.  Gastrointestinal: Negative for nausea and vomiting.  Genitourinary: Negative for dysuria.  Musculoskeletal: Negative for joint swelling.  Skin: Negative for rash.  Neurological: Positive for headaches.  Hematological: Bruises/bleeds easily.  Psychiatric/Behavioral: Positive for dysphoric mood. The patient is nervous/anxious.        Objective:   Physical Exam Well-developed female in no acute distress Nose without purulence or discharge noted Lower extremities without edema, no cyanosis Awake, but appears mildly sleepy, moves all 4 extremities.       Assessment & Plan:

## 2012-04-10 NOTE — Patient Instructions (Addendum)
Will start on nuvigil 150mg  each am Short naps during the day if able to help with alertness. Moderate caffeine use to help with alertness and functional status. Try and get 7 or greater hours each night of sleep.  This is very important.  No driving if sleepy followup with me in 2mos.

## 2012-04-17 ENCOUNTER — Ambulatory Visit (INDEPENDENT_AMBULATORY_CARE_PROVIDER_SITE_OTHER): Payer: 59 | Admitting: Neurology

## 2012-04-17 ENCOUNTER — Encounter: Payer: Self-pay | Admitting: Neurology

## 2012-04-17 VITALS — BP 88/58 | HR 72 | Wt 136.0 lb

## 2012-04-17 DIAGNOSIS — R51 Headache: Secondary | ICD-10-CM
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
## 2012-04-19 ENCOUNTER — Telehealth: Payer: Self-pay | Admitting: Pulmonary Disease

## 2012-04-19 NOTE — Telephone Encounter (Signed)
lmomtcb  

## 2012-04-19 NOTE — Telephone Encounter (Signed)
I would first stop nuvigil and see if headaches stop.  If they do not, then not nuvigil. If they do, I am happy to prescribe adderal, but she needs to understand this has cardiovascular side effects over the long term.
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
## 2012-04-19 NOTE — Telephone Encounter (Signed)
Pt was seen by [facility] on 04/10/12:   Patient Instructions     Will start on nuvigil 150mg  each am  Short naps during the day if able to help with alertness.  Moderate caffeine use to help with alertness and functional status.  Try and get 7 or greater hours each night of sleep. This is very important.  No driving if sleepy  followup with me in 2mos.    -------  Called, spoke with pt who states she started nuvigil on 04/11/12.  Migraines have worsened over the past week. Has read HA are side effect of nuvigil.  Was seen by Dr. Modesto Charon on 04/17/12 at which time was advised he can increase topamax but this will make her sleepy.  Therefore, instead of doing this, states she was advised to see if nuvigil can be changed to adderall to see if this helps the HAs.  Dr. Shelle Iron, pls advise.  Thank you.

## 2012-04-22 ENCOUNTER — Telehealth: Payer: Self-pay | Admitting: Pulmonary Disease

## 2012-04-22 ENCOUNTER — Encounter: Payer: Self-pay | Admitting: *Deleted

## 2012-04-22 MED ORDER — AMPHETAMINE-DEXTROAMPHET ER 20 MG PO CP24: 20.0000 mg | ORAL_CAPSULE | ORAL | Status: DC

## 2012-04-22 NOTE — Telephone Encounter (Signed)
adderall xr (generic)  20mg  one each am.  #30, no fills.  Needs to call for script monthly

## 2012-04-22 NOTE — Telephone Encounter (Signed)
Rx up front for pick up. Pt aware.

## 2012-04-22 NOTE — Telephone Encounter (Signed)
I spoke with pt and is aware will need to call monthly for RX. She voiced her understanding. RX has been printed off. Will call pt once RX is signed for her to pick up. Please advise KC thanks

## 2012-04-22 NOTE — Telephone Encounter (Signed)
lmomtcb x1 

## 2012-04-22 NOTE — Telephone Encounter (Signed)
Pt says she stopped the Nuvigil on Sat. and Sun. and did not have any headaches. She did take the Nuvigil this morning because she has to work and the headache has returned. She would like to get an RX for Adderall and stop taking the Nuvigil. Pls advise. Allergies  Allergen Reactions  . Doxycycline Hyclate     REACTION: heartburn  . Meclizine Hcl     REACTION: urticaria (hives)  . Morphine   . Naproxen Sodium     REACTION: swelling,hives Per pt can now take with no reaction. May 2013

## 2012-04-23 ENCOUNTER — Telehealth: Payer: Self-pay | Admitting: Pulmonary Disease

## 2012-04-23 NOTE — Telephone Encounter (Signed)
Spoke with pt. She states copay for generic adderall was 144$. I called and spoke with pharmacist, Joe, and he states that after reviewing insurance information, was able to get the med approved for 7$. Called pt back and notified her of this and she verbalized understanding. Nothing further needed.

## 2012-05-02 ENCOUNTER — Telehealth: Payer: Self-pay | Admitting: Family

## 2012-05-02 NOTE — Telephone Encounter (Signed)
Caller: [name]/Patient; Patient Name: Cynthia [name]; PCP: Adline Mango [facility]); Best Callback Phone Number: [phone].  Call regarding Cymbalta samples.  Patient doesn't get paid until 9-20, only has 1 tablet remaining for 9-19. Patient would like to know if Office would give her samples of Cymbalta until she gets paid to fill script. Patient seen in office on 03-18-12.  Patient asymptomatic currently. Patient 's symptoms are controlled with Cymbalta 60mg .  Patient' mother-n-law, Tannaz Eckard, will pick samples up on 9-20.  Please follow up with Patient.

## 2012-05-02 NOTE — Telephone Encounter (Signed)
No Cymbalta samples available

## 2012-05-02 NOTE — Telephone Encounter (Signed)
Called and spoke with pt and pt is aware.  

## 2012-05-07 ENCOUNTER — Telehealth: Payer: Self-pay | Admitting: Family

## 2012-05-07 ENCOUNTER — Telehealth: Payer: Self-pay | Admitting: Pulmonary Disease

## 2012-05-07 MED ORDER — AMPHETAMINE-DEXTROAMPHET ER 25 MG PO CP24: 25.0000 mg | ORAL_CAPSULE | ORAL | Status: DC

## 2012-05-07 NOTE — Telephone Encounter (Signed)
Pt aware we are out of cymbalta samples

## 2012-05-07 NOTE — Telephone Encounter (Signed)
Patient is taking Adderall XR 20 mg around 9 or 10 every morning and says that by 3 ie 4 o;clock she [place] very drowsy. She wants to know if her dosage  should be increased? Pls advise  Allergies  Allergen Reactions  . Doxycycline Hyclate     REACTION: heartburn  . Meclizine Hcl     REACTION: urticaria (hives)  . Morphine   . Naproxen Sodium     REACTION: swelling,hives Per pt can now take with no reaction. May 2013

## 2012-05-07 NOTE — Telephone Encounter (Signed)
We can try a little bit higher dose, but the whole point is to be alert during working hours, then not have the medication interfere with sleep that night. Ok to increase XR to 25mg  tab qam.

## 2012-05-07 NOTE — Telephone Encounter (Signed)
ATC, no answer. LMTCB

## 2012-05-07 NOTE — Telephone Encounter (Signed)
Caller: Cynthia [name]/Patient;  PCP: Adline Mango [facility]); Best Callback Phone Number: [phone] Patient requesting samples of Cymbalta, please call.

## 2012-05-07 NOTE — Telephone Encounter (Signed)
Spoke with pt and notified of recs per [facility]. She verbalized understanding and states nothing further needed. Rx was placed up front for pick up.

## 2012-05-08 ENCOUNTER — Telehealth: Payer: Self-pay | Admitting: Pulmonary Disease

## 2012-05-08 NOTE — Telephone Encounter (Signed)
Spoke with pt. She is concerned that the pharmacy will not fill rx since it has not been 30 days since we gave her last rx. I advised that I am unsure what ins will allow, but may not be an issue since we changed her dose due to the ineffectiveness of the lower dose. I advised that she speak with her pharmacist about this and call back if anything further needed from [place]. Pt verbalized understanding and states nothing further needed.

## 2012-06-07 ENCOUNTER — Ambulatory Visit (INDEPENDENT_AMBULATORY_CARE_PROVIDER_SITE_OTHER): Payer: 59 | Admitting: Pulmonary Disease

## 2012-06-07 ENCOUNTER — Encounter: Payer: Self-pay | Admitting: Pulmonary Disease

## 2012-06-07 VITALS — BP 86/54 | HR 77 | Temp 97.5°F | Ht 61.0 in | Wt 136.0 lb

## 2012-06-07 DIAGNOSIS — G47419 Narcolepsy without cataplexy: Secondary | ICD-10-CM

## 2012-06-07 MED ORDER — AMPHETAMINE-DEXTROAMPHETAMINE 20 MG PO TABS: 40.0000 mg | ORAL_TABLET | Freq: Two times a day (BID) | ORAL | Status: DC

## 2012-06-07 NOTE — Progress Notes (Signed)
  Subjective:    Patient ID: Cynthia [name], female    DOB: Oct 03, 1970, 41 y.o.   MRN: [id number]  HPI The patient comes in today for followup of her narcolepsy.  She was initially started on nuvigil, but was recently changed to extended release Adderall because of persistent symptoms.  This has been better for her, but she continues to have significant daytime sleepiness which interferes with her activities starting around 4 or 5 in the afternoon.  This interferes with her ability to complete her work, and also to provide for her family.  She has been following good sleep hygiene.   Review of Systems  Constitutional: Negative for fever and unexpected weight change.  HENT: Negative for ear pain, nosebleeds, congestion, sore throat, rhinorrhea, sneezing, trouble swallowing, dental problem, postnasal drip and sinus pressure.   Eyes: Negative for redness and itching.  Respiratory: Negative for cough, chest tightness, shortness of breath and wheezing.   Cardiovascular: Negative for palpitations and leg swelling.  Gastrointestinal: Negative for nausea and vomiting.  Genitourinary: Negative for dysuria.  Musculoskeletal: Negative for joint swelling.  Skin: Negative for rash.  Neurological: Positive for headaches.  Hematological: Does not bruise/bleed easily.  Psychiatric/Behavioral: Negative for dysphoric mood. The patient is not nervous/anxious.        Objective:   Physical Exam Well-developed female in no acute distress Nose without purulence or discharge noted Neck without lymphadenopathy or thyromegaly Lower extremities without edema, cyanosis Alert and oriented, moves all 4 extremities.       Assessment & Plan:

## 2012-06-07 NOTE — Patient Instructions (Addendum)
Will change your adderall to the immediate release formulation.  Try taking 40mg  in the morning and repeat the dose around 2-3 pm if you need to be alert in late afternoon/early evening. Continue with good sleep hygiene as we discussed. followup with me in 6mos, but give me some feedback about your new dosing.

## 2012-06-07 NOTE — Assessment & Plan Note (Signed)
The patient does well with the Adderall in the mornings, but then gets profoundly sleepy in the afternoon.  I have talked with her about splitting her dose of Adderall, and she is willing to try this.  Will start with 40 mg b.i.d. And see how she responds.  I have cautioned her about taking her second dose too late in the evening, which can alter her sleep.  I also asked her to continue working on good sleep hygiene, and modest use of caffeine during the day to help with alertness.

## 2012-06-11 ENCOUNTER — Telehealth: Payer: Self-pay | Admitting: Pulmonary Disease

## 2012-06-11 NOTE — Telephone Encounter (Signed)
Pt aware of Dr. Young's recommendations. 

## 2012-06-11 NOTE — Telephone Encounter (Signed)
Pt states that there isn't any itching in her scalp, but she has been picking at her scalp. This just started since she started Adderall 20mg  2 tabs BID. Adderall is working well for her narcolepsy but she is worried about picking at her scalp. Please advise for Dr. Shelle Iron.

## 2012-06-11 NOTE — Telephone Encounter (Signed)
This is an OCD pattern from the stimulant, not an allergy. Protect her scalp, see how she does, and refer back to Dr Shelle Iron.

## 2012-06-11 NOTE — Telephone Encounter (Signed)
LM with pt to call back.

## 2012-06-11 NOTE — Telephone Encounter (Signed)
Per CY - she is wear something on her head to keep her hand off of her scalp.   LM for pt to call back.

## 2012-06-20 ENCOUNTER — Telehealth: Payer: Self-pay | Admitting: Pulmonary Disease

## 2012-06-20 NOTE — Telephone Encounter (Signed)
Pt called in on 06-11-12 and states since she started Adderall 20mg  bid on 06/07/12 she has begun to "pick" at her skin to the point where she has sores on her arms, back, and abdomen. She states she cannot control the urge to do this. She states the sores are painful. She states the medication has helped with her narcolepsy, but she is concerned about this side effect. She states the picking has gotten worse since she called on 06-11-12.  Pt was advised of recs as follows from Dr. Maple Hudson on 10-29-13Waymon Budge, MD 06/11/2012 5:20 PM Signed  This is an OCD pattern from the stimulant, not an allergy. Protect her scalp, see how she does, and refer back to Dr Shelle Iron.   Message was not sent to [facility], so I advised the pt that I will send a message to [facility] to address in the AM. Pt states understanding.  Please advise. Carron Curie, CMA

## 2012-06-21 MED ORDER — METHYLPHENIDATE HCL 20 MG PO TABS: 20.0000 mg | ORAL_TABLET | Freq: Two times a day (BID) | ORAL | Status: DC

## 2012-06-21 NOTE — Telephone Encounter (Signed)
I spoke with pt and she is okay with trying ritalin 20 mg 1 po BID. i have printed off and rx for KC to sign. adderrall has been added to pt allergy list.

## 2012-06-21 NOTE — Telephone Encounter (Signed)
Rx signed by Jonathon Bellows.  lmomtcb - does pt want to pick this up or have it mail?  * Rx in triage until this is confirmed

## 2012-06-21 NOTE — Telephone Encounter (Signed)
Pt returned triage's call.  Pt states that she would like to pick up the Rx & stated that her husband, Jacoya Bauman, will pick up the Rx on her behalf.  I advised the Rx would be waiting for the pt up front for her husband to pick up.  Pt verbalized understanding & stated nothing further needed at this time.  Cynthia [name]

## 2012-06-21 NOTE — Telephone Encounter (Signed)
If she is not able to stop doing this, will need to change medication to probably ritalin (although she may have the same issue with this).   Please put adderall in her allergy list and put "intolerance/pruritis" as effect.  If she is ok with this, can give her a prescription for ritalin 20mg  one in am and pm  #60, no fills.

## 2012-07-08 ENCOUNTER — Other Ambulatory Visit: Payer: Self-pay | Admitting: Family

## 2012-07-22 ENCOUNTER — Other Ambulatory Visit: Payer: Self-pay | Admitting: Pulmonary Disease

## 2012-07-22 MED ORDER — METHYLPHENIDATE HCL 20 MG PO TABS: 20.0000 mg | ORAL_TABLET | Freq: Two times a day (BID) | ORAL | Status: DC

## 2012-07-22 NOTE — Telephone Encounter (Signed)
LMOMTCB x 1. RX ready for pick up.

## 2012-07-25 NOTE — Telephone Encounter (Signed)
atc pt to verify rx had been picked up

## 2012-07-26 NOTE — Telephone Encounter (Signed)
lmomtcb x2 for pt 

## 2012-07-29 NOTE — Telephone Encounter (Signed)
LMOMTCB x 1 

## 2012-07-30 NOTE — Telephone Encounter (Signed)
RX has been picked up. Per Shawna Orleans there is nothing up front for the pt. And pt has not called regarding this. We have not been able to speak with the pt and pt will call if anything else is needed.

## 2012-08-21 ENCOUNTER — Telehealth: Payer: Self-pay | Admitting: Pulmonary Disease

## 2012-08-21 NOTE — Telephone Encounter (Signed)
Ritalin has to be filled monthly.  She is on this chronically.

## 2012-08-21 NOTE — Telephone Encounter (Signed)
Pt is requesting a refill on Ritalin. She was last seen on 06/07/12. Her last fill of Ritalin was on 07/22/12. Is this okay to refill?   Please advise. Thanks.

## 2012-08-22 MED ORDER — METHYLPHENIDATE HCL 20 MG PO TABS: 20.0000 mg | ORAL_TABLET | Freq: Two times a day (BID) | ORAL | Status: DC

## 2012-08-22 NOTE — Telephone Encounter (Signed)
Printed for [facility] to sign.  Pt is aware that her prescription is ready to be picked up.

## 2012-09-18 ENCOUNTER — Telehealth: Payer: Self-pay | Admitting: Pulmonary Disease

## 2012-09-18 NOTE — Telephone Encounter (Signed)
I spoke with pt and she stated she has been taking her ritalin BID. She is now feeling tired 4 hrs after taking this medication. She stated she is going to bed about 9:30-10:00. Some nights it is night until 1 AM. But she gets up at 6:30 AM. She takes her med at 8 AM and usually at 3-4 but now having to take at noon bc she is feeling so sleepy. Pt requesting recs. Please advise KC thanks

## 2012-09-18 NOTE — Telephone Encounter (Signed)
We can increase her am ritalin dose to 40mg , and keep afternoon dose at 20mg .  Can give her a one month supply of this.  But let her know this is the highest dose (60mg  a day max).  She has had a lot of med intolerances, and not a lot of options left.  If she continues to have issues, would consider referring her to sleep disorders center at [facility] for other suggestions.

## 2012-09-19 MED ORDER — METHYLPHENIDATE HCL 20 MG PO TABS: ORAL_TABLET | ORAL | Status: DC

## 2012-09-19 NOTE — Telephone Encounter (Signed)
Left a detailed msg that RX is ready for p/u and was placed up front for the pt. No need for callback unless she has questions.

## 2012-09-19 NOTE — Telephone Encounter (Signed)
lmomtcb x1 for pt 

## 2012-09-19 NOTE — Telephone Encounter (Signed)
Done and delivered to triage by hand asap

## 2012-09-19 NOTE — Telephone Encounter (Signed)
Pt returned call. Cynthia [name]  

## 2012-09-19 NOTE — Telephone Encounter (Signed)
I spoke with pt and is aware of KC recs. I have printed off rx. Pt aware will call once ready for p/u. Ashtyn will have KC sign RX. Please advise once done thanks

## 2012-10-16 ENCOUNTER — Telehealth: Payer: Self-pay | Admitting: Family

## 2012-10-16 ENCOUNTER — Ambulatory Visit (INDEPENDENT_AMBULATORY_CARE_PROVIDER_SITE_OTHER): Payer: 59 | Admitting: Internal Medicine

## 2012-10-16 ENCOUNTER — Encounter: Payer: Self-pay | Admitting: Internal Medicine

## 2012-10-16 VITALS — BP 102/58 | HR 95 | Temp 98.2°F | Ht 61.0 in | Wt 123.5 lb

## 2012-10-16 DIAGNOSIS — R599 Enlarged lymph nodes, unspecified: Secondary | ICD-10-CM

## 2012-10-16 DIAGNOSIS — G43909 Migraine, unspecified, not intractable, without status migrainosus: Secondary | ICD-10-CM

## 2012-10-16 MED ORDER — TOPIRAMATE 100 MG PO TABS: 100.0000 mg | ORAL_TABLET | Freq: Every day | ORAL | Status: DC

## 2012-10-16 MED ORDER — METHYLPREDNISOLONE ACETATE 80 MG/ML IJ SUSP
80.0000 mg | Freq: Once | INTRAMUSCULAR | Status: AC
  Administered 2012-10-16: 80 mg via INTRAMUSCULAR

## 2012-10-16 NOTE — Telephone Encounter (Signed)
Patient Information:  Caller Name: Earlyne  Phone: [phone]  Patient: Cynthia [name], Cynthia [name]  Gender: Female  DOB: 07-21-1971  Age: 42 Years  PCP: Adline Mango [facility])  Pregnant: No  Office Follow Up:  Does the office need to follow up with this patient?: Yes  Instructions For The Office: OFFICE PLEASE CHECK WITH MD TO SEE IF PT CAN BE ADDED IN FOR APPT.  CALL PT BACK AT [phone].  SHE IS HAVING HEADACHE WITH SWELLING BEHIND HER RIGHT EAR.  Thanks.   Symptoms  Reason For Call & Symptoms: Pt calling today 10/16/12 regarding has a knot behind her right ear had some ear pain yesterday.  But having headache today.  Reviewed Health History In EMR: Yes  Reviewed Medications In EMR: Yes  Reviewed Allergies In EMR: Yes  Reviewed Surgeries / Procedures: Yes  Date of Onset of Symptoms: 10/16/2012 OB / GYN:  LMP: Unknown  Guideline(s) Used:  Earache  Disposition Per Guideline:   See Today in Office  Reason For Disposition Reached:   Patient wants to be seen  Advice Given:  N/A

## 2012-10-16 NOTE — Patient Instructions (Signed)
Lymphadenopathy  Lymphadenopathy means "disease of the lymph glands." But the term is usually used to describe swollen or enlarged lymph glands, also called lymph nodes. These are the bean-shaped organs found in many locations including the neck, underarm, and groin. Lymph glands are part of the immune system, which fights infections in your body. Lymphadenopathy can occur in just one area of the body, such as the neck, or it can be generalized, with lymph node enlargement in several areas. The nodes found in the neck are the most common sites of lymphadenopathy.  CAUSES    When your immune system responds to germs (such as viruses or bacteria ), infection-fighting cells and fluid build up. This causes the glands to grow in size. This is usually not something to worry about. Sometimes, the glands themselves can become infected and inflamed. This is called lymphadenitis.  Enlarged lymph nodes can be caused by many diseases:   Bacterial disease, such as strep throat or a skin infection.   Viral disease, such as a common cold.   Other germs, such as lyme disease, tuberculosis, or sexually transmitted diseases.   Cancers, such as lymphoma (cancer of the lymphatic system) or leukemia (cancer of the white blood cells).   Inflammatory diseases such as lupus or rheumatoid arthritis.   Reactions to medications.  Many of the diseases above are rare, but important. This is why you should see your caregiver if you have lymphadenopathy.  SYMPTOMS     Swollen, enlarged lumps in the neck, back of the head or other locations.   Tenderness.   Warmth or redness of the skin over the lymph nodes.   Fever.  DIAGNOSIS   Enlarged lymph nodes are often near the source of infection. They can help healthcare providers diagnose your illness. For instance:     Swollen lymph nodes around the jaw might be caused by an infection in the mouth.   Enlarged glands in the neck often signal a throat infection.    Lymph nodes that are swollen in more than one area often indicate an illness caused by a virus.  Your caregiver most likely will know what is causing your lymphadenopathy after listening to your history and examining you. Blood tests, x-rays or other tests may be needed. If the cause of the enlarged lymph node cannot be found, and it does not go away by itself, then a biopsy may be needed. Your caregiver will discuss this with you.  TREATMENT    Treatment for your enlarged lymph nodes will depend on the cause. Many times the nodes will shrink to normal size by themselves, with no treatment. Antibiotics or other medicines may be needed for infection. Only take over-the-counter or prescription medicines for pain, discomfort or fever as directed by your caregiver.  HOME CARE INSTRUCTIONS    Swollen lymph glands usually return to normal when the underlying medical condition goes away. If they persist, contact your health-care provider. He/she might prescribe antibiotics or other treatments, depending on the diagnosis. Take any medications exactly as prescribed. Keep any follow-up appointments made to check on the condition of your enlarged nodes.    SEEK MEDICAL CARE IF:     Swelling lasts for more than two weeks.   You have symptoms such as weight loss, night sweats, fatigue or fever that does not go away.   The lymph nodes are hard, seem fixed to the skin or are growing rapidly.   Skin over the lymph nodes is red and inflamed. This   could mean there is an infection.  SEEK IMMEDIATE MEDICAL CARE IF:     Fluid starts leaking from the area of the enlarged lymph node.   You develop a fever of 102 F (38.9 C) or greater.   Severe pain develops (not necessarily at the site of a large lymph node).   You develop chest pain or shortness of breath.   You develop worsening abdominal pain.  MAKE SURE YOU:     Understand these instructions.   Will watch your condition.    Will get help right away if you are not doing well or get worse.  Document Released: 05/09/2008 Document Revised: 10/23/2011 Document Reviewed: 05/09/2008  Exit[employer] Patient Information 2013 [employer].

## 2012-10-16 NOTE — Telephone Encounter (Signed)
See if anyone has availability and schedule

## 2012-10-16 NOTE — Telephone Encounter (Signed)
Appointment scheduled for pt to see Rene Kocher at [place]. Left detailed message to advise pt of appt

## 2012-10-16 NOTE — Progress Notes (Signed)
HPI  Pt presents to the clinic today with c/o a painful knot behind her right ear. She noticed it first thing this morning when she woke up. She has no other c/o other than just pain. She did take some Advil which did help relieve the pain. Additionally, today, she would like a refill of her Topomax for Migraine prophylaxis. She has doing well on this medication, tolerating it well without any side effects.  Review of Systems      Past Medical History  Diagnosis Date  . Anxiety and depression 04/03/2008  . Acne   . Hemorrhoids     Family History  Problem Relation Age of Onset  . Prostate cancer Maternal Grandfather   . Kidney disease Sister   . Heart disease Paternal Grandfather   . Depression      History   Social History  . Marital Status: Married    Spouse Name: N/A    Number of Children: N/A  . 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 Smoking status: Former Smoker -- 1.00 packs/day for 10 years    Types: Cigarettes    Quit date: 08/15/2003  . Smokeless tobacco: Never Used  . Alcohol Use: No     Comment: socially  . Drug Use: No  . Sexually Active: Not on file   Other Topics Concern  . Not on file   Social History Narrative  . No narrative on file    Allergies  Allergen Reactions  . Adderall (Amphetamine-Dextroamphetamine)     pruritis  . Doxycycline Hyclate     REACTION: heartburn  . Meclizine Hcl     REACTION: urticaria (hives)  . Morphine   . Naproxen Sodium     REACTION: swelling,hives Per pt can now take with no reaction. May 2013     Constitutional: Positive headache. Denies fever, fatigue or abrupt weight changes.  HEENT: Pt reports lump behind right ear. Denies eye redness, eye pain, pressure behind the eyes, facial pain, nasal congestion, ear pain, ringing in the ears, wax buildup, runny nose or bloody nose. Respiratory: Denies cough, difficulty breathing or shortness of breath.   Cardiovascular: Denies chest pain, chest tightness, palpitations or swelling in the hands or feet.   No other specific complaints in a complete review of systems (except as listed in HPI above).  Objective:   BP 102/58  Pulse 95  Temp(Src) 98.2 F (36.8 C) (Oral)  Ht 5\' 1"  (1.549 m)  Wt 123 lb 8 oz (56.019 kg)  BMI 23.35 kg/m2  SpO2 95% Wt Readings from Last 3 Encounters:  10/16/12 123 lb 8 oz (56.019 kg)  06/07/12 136 lb (61.689 kg)  04/17/12 136 lb (61.689 kg)     General: Appears her stated age, well developed, well nourished in NAD. HEENT: Head: normal shape and size; Eyes: sclera white, no icterus, conjunctiva pink, PERRLA and EOMs intact; Ears: Tm's gray and intact, normal light reflex; Nose: mucosa pink and moist, septum midline; Throat/Mouth: + PND. Teeth present, mucosa erythematous and moist, no exudate noted, no lesions or ulcerations noted.  Neck: Mild occipital lymphadenopathy on the right. Neck supple, trachea midline. No massses, lumps or thyromegaly present.  Cardiovascular: Normal rate and rhythm. S1,S2 noted.  No murmur, rubs or gallops noted. No JVD or BLE edema. No carotid bruits noted. Pulmonary/Chest: Normal effort and positive vesicular breath sounds. No respiratory distress. No wheezes, rales or ronchi noted.  Assessment & Plan:   Occipital Lymphadenopathy, new, no additional workup required at this time:  80 mg Depo IM today Continue Advil as needed for pain  Migraines, chronic, no additional workup required:  Refilled topomax today  RTC as needed or if symptoms persist.

## 2012-10-24 ENCOUNTER — Telehealth: Payer: Self-pay | Admitting: Pulmonary Disease

## 2012-10-25 MED ORDER — METHYLPHENIDATE HCL 20 MG PO TABS: ORAL_TABLET | ORAL | Status: DC

## 2012-10-25 NOTE — Telephone Encounter (Signed)
Pt is aware we will call her once ready for p/u. RX placed in [facility] look at for signature. Please advise KC thanks

## 2012-10-25 NOTE — Telephone Encounter (Signed)
I do not see this form.

## 2012-10-25 NOTE — Telephone Encounter (Signed)
Pt returned call. She will pick rx up today. Nothing further needed per pt. Cynthia [name]

## 2012-10-25 NOTE — Telephone Encounter (Signed)
It is on top of your green folder. Please advise KC thanks

## 2012-10-25 NOTE — Telephone Encounter (Signed)
RX placed upfront for pick up. L;mtcb x1 for pt to make aware

## 2012-10-28 ENCOUNTER — Other Ambulatory Visit: Payer: Self-pay | Admitting: Family

## 2012-11-13 ENCOUNTER — Encounter: Payer: Self-pay | Admitting: Family Medicine

## 2012-11-13 ENCOUNTER — Ambulatory Visit (INDEPENDENT_AMBULATORY_CARE_PROVIDER_SITE_OTHER): Payer: 59 | Admitting: Family Medicine

## 2012-11-13 VITALS — BP 100/56 | HR 94 | Temp 99.0°F | Wt 124.0 lb

## 2012-11-13 DIAGNOSIS — L299 Pruritus, unspecified: Secondary | ICD-10-CM

## 2012-11-13 DIAGNOSIS — M542 Cervicalgia: Secondary | ICD-10-CM

## 2012-11-13 DIAGNOSIS — F411 Generalized anxiety disorder: Secondary | ICD-10-CM

## 2012-11-13 MED ORDER — TRIAMCINOLONE ACETONIDE 0.1 % EX CREA: TOPICAL_CREAM | Freq: Two times a day (BID) | CUTANEOUS | Status: DC

## 2012-11-13 MED ORDER — CYCLOBENZAPRINE HCL 10 MG PO TABS: 10.0000 mg | ORAL_TABLET | Freq: Three times a day (TID) | ORAL | Status: DC | PRN

## 2012-11-13 MED ORDER — LORAZEPAM 1 MG PO TABS: 1.0000 mg | ORAL_TABLET | Freq: Two times a day (BID) | ORAL | Status: DC | PRN

## 2012-11-13 NOTE — Progress Notes (Signed)
  Subjective:    Patient ID: Cynthia [name], female    DOB: [birth year], 42 y.o.   MRN: [id number]  HPI Here for several issues. First she has a habit of picking at her skin because it itches. She uses [employer] and moisturizers. Also she asks for a rx for Ativan to use prn anxiety. She has used this in the past with success. Lastly she has chronic tightness and pain in the neck which sometimes radiates down the tight arm. Using heat and Advil.    Review of Systems  Constitutional: Negative.   HENT: Positive for neck pain and neck stiffness.   Neurological: Negative.   Psychiatric/Behavioral: Positive for agitation. Negative for hallucinations, behavioral problems, confusion, dysphoric mood and decreased concentration. The patient is nervous/anxious.        Objective:   Physical Exam  Constitutional: She appears well-developed and well-nourished. No distress.  Musculoskeletal:  She has spasm in the posterior neck and the right trapezius with reduced ROM   Psychiatric: She has a normal mood and affect. Her behavior is normal. Thought content normal.          Assessment & Plan:  Add Flexeril for the neck spasms. Use Triamcinolone cream for the skin. Use Ativan prn and follow up with Adline Mango.

## 2012-11-18 ENCOUNTER — Other Ambulatory Visit: Payer: Self-pay

## 2012-11-18 NOTE — Telephone Encounter (Signed)
Opened in error

## 2012-11-25 ENCOUNTER — Telehealth: Payer: Self-pay | Admitting: Pulmonary Disease

## 2012-11-25 MED ORDER — METHYLPHENIDATE HCL 20 MG PO TABS: ORAL_TABLET | ORAL | Status: DC

## 2012-11-25 NOTE — Telephone Encounter (Signed)
Rx was printed and placed in KC's green folder to sign  Please advise when done thanks!

## 2012-11-25 NOTE — Telephone Encounter (Signed)
done

## 2012-11-25 NOTE — Telephone Encounter (Signed)
Rx signed and placed up front Pt aware Nothing further needed

## 2012-12-02 ENCOUNTER — Other Ambulatory Visit: Payer: Self-pay | Admitting: Internal Medicine

## 2012-12-06 ENCOUNTER — Ambulatory Visit (INDEPENDENT_AMBULATORY_CARE_PROVIDER_SITE_OTHER): Payer: 59 | Admitting: Pulmonary Disease

## 2012-12-06 ENCOUNTER — Encounter: Payer: Self-pay | Admitting: Pulmonary Disease

## 2012-12-06 VITALS — BP 90/58 | HR 88 | Temp 97.0°F | Ht 61.0 in | Wt 127.4 lb

## 2012-12-06 DIAGNOSIS — G47419 Narcolepsy without cataplexy: Secondary | ICD-10-CM

## 2012-12-06 NOTE — Progress Notes (Signed)
  Subjective:    Patient ID: Cynthia [name], female    DOB: Mar 30, 1971, 42 y.o.   MRN: [id number]  HPI Patient comes in today for followup of her narcolepsy.  She was unable to tolerate Adderall, and has been changed over to Ritalin with fairly good success.  It keeps her functional to do her job, and also safe driving back and forth to work.  She still has sleepiness at times, but it is greatly improved.  She is continuing to work on good sleep hygiene.   Review of Systems  Constitutional: Negative for fever and unexpected weight change.  HENT: Negative for ear pain, nosebleeds, congestion, sore throat, rhinorrhea, sneezing, trouble swallowing, dental problem, postnasal drip and sinus pressure.   Eyes: Negative for redness and itching.  Respiratory: Negative for cough, chest tightness, shortness of breath and wheezing.   Cardiovascular: Negative for palpitations and leg swelling.  Gastrointestinal: Negative for nausea and vomiting.  Genitourinary: Negative for dysuria.  Musculoskeletal: Negative for joint swelling.  Skin: Negative for rash.       Pt has been "picking" her skin as side effect of her Adderall and Ritalin---causing sores   Neurological: Negative for headaches.  Hematological: Does not bruise/bleed easily.  Psychiatric/Behavioral: Negative for dysphoric mood. The patient is not nervous/anxious.        Objective:   Physical Exam Well-developed female in no acute distress Nose without purulence or discharge noted Neck without lymphadenopathy or thyromegaly Lower extremities without edema, no cyanosis Minimally sleepy today, answers questions appropriately, moves all 4 extremities.       Assessment & Plan:

## 2012-12-06 NOTE — Patient Instructions (Addendum)
Continue getting a good nights sleep. No change in your stimulant medication. followup with me in one year if doing well.

## 2012-12-06 NOTE — Assessment & Plan Note (Signed)
The pt is doing well on her current regimen.  She is tolerating the medication, and for the most part remains functional.  She would like to continue with this. I have reminded her to stick with good sleep hygiene, take naps when able.

## 2012-12-23 ENCOUNTER — Other Ambulatory Visit: Payer: Self-pay | Admitting: Family Medicine

## 2012-12-26 ENCOUNTER — Telehealth: Payer: Self-pay | Admitting: Pulmonary Disease

## 2012-12-26 MED ORDER — METHYLPHENIDATE HCL 20 MG PO TABS: ORAL_TABLET | ORAL | Status: DC

## 2012-12-26 NOTE — Telephone Encounter (Signed)
rx printed off and placed in KC green folder to be signed.  Will call pt once this has been completed. thanks

## 2012-12-26 NOTE — Telephone Encounter (Signed)
She was supposed to follow up with [facility]

## 2012-12-27 NOTE — Telephone Encounter (Signed)
Left message on machine that Rx is ready to p/u. Rx is up front in file folder.

## 2013-01-21 ENCOUNTER — Other Ambulatory Visit: Payer: Self-pay | Admitting: Obstetrics and Gynecology

## 2013-01-21 DIAGNOSIS — N63 Unspecified lump in unspecified breast: Secondary | ICD-10-CM

## 2013-01-23 ENCOUNTER — Emergency Department (HOSPITAL_BASED_OUTPATIENT_CLINIC_OR_DEPARTMENT_OTHER)
Admission: EM | Admit: 2013-01-23 | Discharge: 2013-01-23 | Disposition: A | Discharge status: Home / Self Care | Payer: 59 | Attending: Emergency Medicine | Admitting: Emergency Medicine

## 2013-01-23 ENCOUNTER — Encounter (HOSPITAL_BASED_OUTPATIENT_CLINIC_OR_DEPARTMENT_OTHER): Payer: Self-pay | Admitting: *Deleted

## 2013-01-23 ENCOUNTER — Emergency Department (HOSPITAL_BASED_OUTPATIENT_CLINIC_OR_DEPARTMENT_OTHER): Payer: 59

## 2013-01-23 DIAGNOSIS — Z85828 Personal history of other malignant neoplasm of skin: Secondary | ICD-10-CM | POA: Insufficient documentation

## 2013-01-23 DIAGNOSIS — Z87891 Personal history of nicotine dependence: Secondary | ICD-10-CM | POA: Insufficient documentation

## 2013-01-23 DIAGNOSIS — M545 Low back pain, unspecified: Secondary | ICD-10-CM | POA: Insufficient documentation

## 2013-01-23 DIAGNOSIS — R112 Nausea with vomiting, unspecified: Secondary | ICD-10-CM | POA: Insufficient documentation

## 2013-01-23 DIAGNOSIS — F341 Dysthymic disorder: Secondary | ICD-10-CM | POA: Insufficient documentation

## 2013-01-23 DIAGNOSIS — Z9889 Other specified postprocedural states: Secondary | ICD-10-CM | POA: Insufficient documentation

## 2013-01-23 DIAGNOSIS — R748 Abnormal levels of other serum enzymes: Secondary | ICD-10-CM | POA: Insufficient documentation

## 2013-01-23 DIAGNOSIS — Z79899 Other long term (current) drug therapy: Secondary | ICD-10-CM | POA: Insufficient documentation

## 2013-01-23 DIAGNOSIS — Z8679 Personal history of other diseases of the circulatory system: Secondary | ICD-10-CM | POA: Insufficient documentation

## 2013-01-23 DIAGNOSIS — Z872 Personal history of diseases of the skin and subcutaneous tissue: Secondary | ICD-10-CM | POA: Insufficient documentation

## 2013-01-23 DIAGNOSIS — Z90710 Acquired absence of both cervix and uterus: Secondary | ICD-10-CM | POA: Insufficient documentation

## 2013-01-23 DIAGNOSIS — R109 Unspecified abdominal pain: Secondary | ICD-10-CM | POA: Insufficient documentation

## 2013-01-23 DIAGNOSIS — Z3202 Encounter for pregnancy test, result negative: Secondary | ICD-10-CM | POA: Insufficient documentation

## 2013-01-23 LAB — COMPREHENSIVE METABOLIC PANEL
ALT: 22 U/L (ref 0–35)
Calcium: 10.4 mg/dL (ref 8.4–10.5)
Creatinine, Ser: 1.5 mg/dL — ABNORMAL HIGH (ref 0.50–1.10)
GFR calc Af Amer: 50 mL/min — ABNORMAL LOW (ref >90)
Glucose, Bld: 89 mg/dL (ref 70–99)
Sodium: 139 mEq/L (ref 135–145)
Total Protein: 7.9 g/dL (ref 6.0–8.3)

## 2013-01-23 LAB — CBC WITH DIFFERENTIAL/PLATELET
Basophils Absolute: 0 10*3/uL (ref 0.0–0.1)
Eosinophils Absolute: 0.1 10*3/uL (ref 0.0–0.7)
Eosinophils Relative: 1 % (ref 0–5)
MCH: 32 pg (ref 26.0–34.0)
MCV: 91 fL (ref 78.0–100.0)
Platelets: 194 10*3/uL (ref 150–400)
RDW: 12.4 % (ref 11.5–15.5)

## 2013-01-23 LAB — URINALYSIS, ROUTINE W REFLEX MICROSCOPIC
Bilirubin Urine: NEGATIVE
Hgb urine dipstick: NEGATIVE
Nitrite: NEGATIVE
Protein, ur: NEGATIVE mg/dL
Specific Gravity, Urine: 1.01 (ref 1.005–1.030)
Urobilinogen, UA: 0.2 mg/dL (ref 0.0–1.0)

## 2013-01-23 LAB — PREGNANCY, URINE: Preg Test, Ur: NEGATIVE

## 2013-01-23 LAB — URINE MICROSCOPIC-ADD ON

## 2013-01-23 MED ORDER — HYDROMORPHONE HCL PF 1 MG/ML IJ SOLN
0.5000 mg | Freq: Once | INTRAMUSCULAR | Status: AC
  Administered 2013-01-23: 0.5 mg via INTRAVENOUS
  Filled 2013-01-23: qty 1

## 2013-01-23 MED ORDER — OXYCODONE-ACETAMINOPHEN 5-325 MG PO TABS: 1.0000 | ORAL_TABLET | ORAL | Status: DC | PRN

## 2013-01-23 MED ORDER — SODIUM CHLORIDE 0.9 % IV BOLUS (SEPSIS)
1000.0000 mL | Freq: Once | INTRAVENOUS | Status: AC
  Administered 2013-01-23: 1000 mL via INTRAVENOUS

## 2013-01-23 MED ORDER — IOHEXOL 300 MG/ML  SOLN
50.0000 mL | Freq: Once | INTRAMUSCULAR | Status: AC | PRN
  Administered 2013-01-23: 50 mL via ORAL

## 2013-01-23 MED ORDER — ONDANSETRON HCL 4 MG/2ML IJ SOLN
4.0000 mg | Freq: Once | INTRAMUSCULAR | Status: AC
  Administered 2013-01-23: 4 mg via INTRAVENOUS
  Filled 2013-01-23: qty 2

## 2013-01-23 MED ORDER — DEXTROSE 5 % IV SOLN
1.0000 g | Freq: Once | INTRAVENOUS | Status: AC
  Administered 2013-01-23: 1 g via INTRAVENOUS
  Filled 2013-01-23: qty 10

## 2013-01-23 MED ORDER — IOHEXOL 300 MG/ML  SOLN
100.0000 mL | Freq: Once | INTRAMUSCULAR | Status: AC | PRN
  Administered 2013-01-23: 100 mL via INTRAVENOUS

## 2013-01-23 MED ORDER — ONDANSETRON 8 MG PO TBDP: 8.0000 mg | ORAL_TABLET | Freq: Three times a day (TID) | ORAL | Status: DC | PRN

## 2013-01-23 NOTE — ED Provider Notes (Signed)
History     CSN: [id number]  Arrival date & time 01/23/13  1842   First MD Initiated Contact with Patient 01/23/13 1901      Chief Complaint  Patient presents with  . Emesis    (Consider location/radiation/quality/duration/timing/severity/associated sxs/prior treatment) Patient is a 42 y.o. female presenting with vomiting. The history is provided by the patient.  Emesis Severity:  Moderate Timing:  Constant  patient complaining of nausea and vomiting today. She states that she vomited one time but has been extremely nauseated today. She complains of some low back pain radiating to bilateral sides. She denies any frequency of urination, dysuria, or hematuria. She's had a partial hysterectomy and does not have regular menstrual cycles. She has not had fever but felt that she has had some chills. She has been able to take some by mouth liquid  Past Medical History  Diagnosis Date  . Anxiety and depression 04/03/2008  . Acne   . Hemorrhoids   . Basal cell cancer     Past Surgical History  Procedure Laterality Date  . Partial hysterectomy    . Bunionectomy    . Cesarean section      x 2  . Skin cancer excision      Family History  Problem Relation Age of Onset  . Prostate cancer Maternal Grandfather   . Kidney disease Sister   . Heart disease Paternal Grandfather   . Depression      History  Substance Use Topics  . Smoking status: Former Smoker -- 1.00 packs/day for 10 years    Types: Cigarettes    Quit date: 08/15/2003  . Smokeless tobacco: Never Used  . Alcohol Use: Yes     Comment: rare    OB History   Grav Para Term Preterm Abortions TAB SAB Ect Mult Living                  Review of Systems  Gastrointestinal: Positive for vomiting.  All other systems reviewed and are negative.    Allergies  Adderall; Doxycycline hyclate; Meclizine hcl; Morphine; and Naproxen sodium  Home Medications   Current Outpatient Rx  Name  Route  Sig  Dispense  Refill   . cyclobenzaprine (FLEXERIL) 10 MG tablet   Oral   Take 1 tablet (10 mg total) by mouth 3 (three) times daily as needed for muscle spasms.   60 tablet   2   . DULoxetine (CYMBALTA) 60 MG capsule   Oral   Take 180 mg by mouth daily.          Marland Kitchen LORazepam (ATIVAN) 1 MG tablet      TAKE ONE TABLET BY MOUTH TWICE DAILY AS NEEDED FOR ANXIETY   60 tablet   0   . methylphenidate (RITALIN) 20 MG tablet      Take 2 tablets in the morning and 1 in the afternoon   90 tablet   0   . topiramate (TOPAMAX) 100 MG tablet      TAKE ONE TABLET BY MOUTH ONCE DAILY   30 tablet   0   . tretinoin (RETIN-A) 0.05 % cream   Topical   Apply 1 application topically at bedtime.           BP 103/57  Pulse 71  Temp(Src) 98.3 F (36.8 C) (Oral)  Resp 20  SpO2 100%  Physical Exam  Nursing note and vitals reviewed. Constitutional: She is oriented to person, place, and time. She appears well-developed and well-nourished.  HENT:  Head: Normocephalic and atraumatic.  Right Ear: External ear normal.  Left Ear: External ear normal.  Nose: Nose normal.  Mouth/Throat: Oropharynx is clear and moist.  Eyes: Conjunctivae and EOM are normal. Pupils are equal, round, and reactive to light.  Neck: Normal range of motion. Neck supple.  Cardiovascular: Normal rate, regular rhythm, normal heart sounds and intact distal pulses.   Pulmonary/Chest: Effort normal and breath sounds normal.  Abdominal: Soft. Bowel sounds are normal. There is tenderness.  Right lower quadrant tenderness which is moderate in nature some right upper quadrant tenderness no midline tenderness no left-sided abdominal tenderness abdomen is soft and there is no rebound noted.  Musculoskeletal: Normal range of motion.  Neurological: She is alert and oriented to person, place, and time. She has normal reflexes.  Skin: Skin is warm and dry.  Psychiatric: She has a normal mood and affect. Her behavior is normal. Thought content normal.     ED Course  Procedures (including critical care time)  Labs Reviewed  URINALYSIS, ROUTINE W REFLEX MICROSCOPIC - Abnormal; Notable for the following:    Leukocytes, UA TRACE (*)    All other components within normal limits  URINE MICROSCOPIC-ADD ON - Abnormal; Notable for the following:    Squamous Epithelial / LPF FEW (*)    All other components within normal limits  CBC WITH DIFFERENTIAL - Abnormal; Notable for the following:    HCT 35.2 (*)    All other components within normal limits  COMPREHENSIVE METABOLIC PANEL - Abnormal; Notable for the following:    Potassium 3.4 (*)    Creatinine, Ser 1.50 (*)    GFR calc non Af Amer 43 (*)    GFR calc Af Amer 50 (*)    All other components within normal limits  LIPASE, BLOOD - Abnormal; Notable for the following:    Lipase 82 (*)    All other components within normal limits  PREGNANCY, URINE   Ct Abdomen Pelvis W Contrast  01/23/2013   *RADIOLOGY REPORT*  Clinical Data: Right lower quadrant abdominal pain, nausea and vomiting for 1 day.  CT ABDOMEN AND PELVIS WITH CONTRAST  Technique:  Multidetector CT imaging of the abdomen and pelvis was performed following the standard protocol during bolus administration of intravenous contrast.  Contrast: 50mL OMNIPAQUE IOHEXOL 300 MG/ML  SOLN, OMNIPAQUE IOHEXOL 300 MG/ML  SOLN  Comparison: None.  Findings: Minimal scarring in the right lung base.  The liver, spleen, gallbladder, pancreas, adrenal glands, abdominal aorta, inferior vena cava, and retroperitoneal lymph nodes are unremarkable.  The stomach is filled with contrast material.  No gastric wall thickening or outlet obstruction is suggested.  Small bowel are mostly decompressed.  Diffusely stool filled colon without distension.  No free air or free fluid.  Abdominal wall musculature appears intact.  The kidneys demonstrate a heterogeneous nephrogram bilaterally.  This pattern suggests pyelonephritis.  No solid mass or hydronephrosis is  suggested.  Pelvis:  The uterus and ovaries do not appear enlarged.  Bladder wall is not thickened.  Rectosigmoid colon is unremarkable.  No evidence of diverticulitis.  The appendix is normal.  No significant pelvic lymphadenopathy.  Normal alignment of the lumbar vertebrae.  IMPRESSION: Heterogeneous nephrograms and both kidneys suggest pyelonephritis. Appendix is normal.  Diffusely stool filled colon.   Original Report Authenticated By: Burman Nieves, M.D.     No diagnosis found.  Results for orders placed during the hospital encounter of 01/23/13  PREGNANCY, URINE  Result Value Range   Preg Test, Ur NEGATIVE  NEGATIVE  URINALYSIS, ROUTINE W REFLEX MICROSCOPIC      Result Value Range   Color, Urine YELLOW  YELLOW   APPearance CLEAR  CLEAR   Specific Gravity, Urine 1.010  1.005 - 1.030   pH 5.0  5.0 - 8.0   Glucose, UA NEGATIVE  NEGATIVE mg/dL   Hgb urine dipstick NEGATIVE  NEGATIVE   Bilirubin Urine NEGATIVE  NEGATIVE   Ketones, ur NEGATIVE  NEGATIVE mg/dL   Protein, ur NEGATIVE  NEGATIVE mg/dL   Urobilinogen, UA 0.2  0.0 - 1.0 mg/dL   Nitrite NEGATIVE  NEGATIVE   Leukocytes, UA TRACE (*) NEGATIVE  URINE MICROSCOPIC-ADD ON      Result Value Range   Squamous Epithelial / LPF FEW (*) RARE   WBC, UA 0-2  <3 WBC/hpf   Bacteria, UA RARE  RARE  CBC WITH DIFFERENTIAL      Result Value Range   WBC 7.8  4.0 - 10.5 K/uL   RBC 3.87  3.87 - 5.11 MIL/uL   Hemoglobin 12.4  12.0 - 15.0 g/dL   HCT 16.1 (*) 09.6 - 04.5 %   MCV 91.0  78.0 - 100.0 fL   MCH 32.0  26.0 - 34.0 pg   MCHC 35.2  30.0 - 36.0 g/dL   RDW 40.9  81.1 - 91.4 %   Platelets 194  150 - 400 K/uL   Neutrophils Relative % 72  43 - 77 %   Neutro Abs 5.6  1.7 - 7.7 K/uL   Lymphocytes Relative 18  12 - 46 %   Lymphs Abs 1.4  0.7 - 4.0 K/uL   Monocytes Relative 9  3 - 12 %   Monocytes Absolute 0.7  0.1 - 1.0 K/uL   Eosinophils Relative 1  0 - 5 %   Eosinophils Absolute 0.1  0.0 - 0.7 K/uL   Basophils Relative 0  0 -  1 %   Basophils Absolute 0.0  0.0 - 0.1 K/uL  COMPREHENSIVE METABOLIC PANEL      Result Value Range   Sodium 139  135 - 145 mEq/L   Potassium 3.4 (*) 3.5 - 5.1 mEq/L   Chloride 104  96 - 112 mEq/L   CO2 24  19 - 32 mEq/L   Glucose, Bld 89  70 - 99 mg/dL   BUN 20  6 - 23 mg/dL   Creatinine, Ser 7.82 (*) 0.50 - 1.10 mg/dL   Calcium 95.6  8.4 - 21.3 mg/dL   Total Protein 7.9  6.0 - 8.3 g/dL   Albumin 4.1  3.5 - 5.2 g/dL   AST 23  0 - 37 U/L   ALT 22  0 - 35 U/L   Alkaline Phosphatase 68  39 - 117 U/L   Total Bilirubin 0.5  0.3 - 1.2 mg/dL   GFR calc non Af Amer 43 (*) >90 mL/min   GFR calc Af Amer 50 (*) >90 mL/min  LIPASE, BLOOD      Result Value Range   Lipase 82 (*) 11 - 59 U/L     MDM  Patient with elevated lipase but no elevated white count or elevated liver function tests. On exam the tenderness was localized to the right lower quadrant and CT of the abdomen does not show any evidence of appendicitis. Patient has a normal white blood cell count. She is improved after 0.5 mg of Dilaudid and Zofran. There has been no vomiting or  diarrhea here. I discussed the test results with her. She is advised to follow with her primary care Dr. tomorrow or return here she is worse anytime. She is advised to stay on clear liquids. She'll be given 6 Percocet and Zofran for nausea CT was significant that it was a question of pyelonephritis with heterogenous nephrograms. The patient's symptoms she is given a gram of Rocephin here and is treated for possible urinary tract infection. Urine will be cultured.      Hilario Quarry, MD 01/23/13 [phone]

## 2013-01-23 NOTE — ED Notes (Signed)
Pt reports her baseline SBP 90s

## 2013-01-23 NOTE — ED Notes (Signed)
Patient states that she has had N/V today with lower back pain and lower abd pain.

## 2013-01-23 NOTE — ED Notes (Signed)
Pt states she is able to tolerate dilaudid in small doses-EDP notified-orders received

## 2013-01-25 LAB — URINE CULTURE

## 2013-01-28 ENCOUNTER — Encounter: Payer: Self-pay | Admitting: Family

## 2013-01-28 ENCOUNTER — Ambulatory Visit (INDEPENDENT_AMBULATORY_CARE_PROVIDER_SITE_OTHER): Payer: 59 | Admitting: Family

## 2013-01-28 ENCOUNTER — Telehealth: Payer: Self-pay | Admitting: Family

## 2013-01-28 VITALS — BP 104/80 | HR 94 | Wt 128.0 lb

## 2013-01-28 DIAGNOSIS — R110 Nausea: Secondary | ICD-10-CM

## 2013-01-28 DIAGNOSIS — R1011 Right upper quadrant pain: Secondary | ICD-10-CM

## 2013-01-28 DIAGNOSIS — N12 Tubulo-interstitial nephritis, not specified as acute or chronic: Secondary | ICD-10-CM

## 2013-01-28 LAB — POCT URINALYSIS DIPSTICK
Bilirubin, UA: NEGATIVE
Blood, UA: NEGATIVE
Ketones, UA: NEGATIVE
Nitrite, UA: NEGATIVE
Spec Grav, UA: 1.015
pH, UA: 7.5

## 2013-01-28 LAB — H. PYLORI ANTIBODY, IGG: H Pylori IgG: NEGATIVE

## 2013-01-28 NOTE — Patient Instructions (Signed)

## 2013-01-28 NOTE — Progress Notes (Signed)
Subjective:    Patient ID: Cynthia [name], female    DOB: Mar 12, 1971, 42 y.o.   MRN: [id number]  HPI  Pt is a 42 year old white female who presents to PCP after a follow up from urgent care. Pt presented to urgent care with RLQ abdominal pain that radiated to RUQ and pressure in lower back. Pt was treated for possible pyelonephritis at facility, but presents to PCP with no resolution of symptoms. Pain to RLQ is still present, accompanied with nausea and decrease appetite. Pt states pain is worsened by food intake and has not been able to identify any relieving factors. Pt states she is also experiencing a lot of work and home related stress.   Review of Systems  Constitutional: Positive for appetite change.  HENT: Negative.   Eyes: Negative.   Respiratory:       R shoulder pain with deep inspiration  Cardiovascular: Negative.   Gastrointestinal: Positive for nausea and abdominal pain.  Endocrine: Negative.   Genitourinary: Negative.   Musculoskeletal: Negative.   Skin: Negative.   Allergic/Immunologic: Negative.   Neurological: Negative.   Hematological: Negative.   Psychiatric/Behavioral: Negative.    Past Medical History  Diagnosis Date  . Anxiety and depression 04/03/2008  . Acne   . Hemorrhoids   . Basal cell cancer     History   Social History  . Marital Status: Married    Spouse Name: N/A    Number of Children: N/A  . 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 Smoking status: Former Smoker -- 1.00 packs/day for 10 years    Types: Cigarettes    Quit date: 08/15/2003  . Smokeless tobacco: Never Used  . Alcohol Use: Yes     Comment: rare  . Drug Use: No  . Sexually Active: Not on file   Other Topics Concern  . Not on file   Social History Narrative  . No narrative on file    Past Surgical History  Procedure Laterality Date  . Partial hysterectomy    . Bunionectomy    . Cesarean section      x 2   . Skin cancer excision      Family History  Problem Relation Age of Onset  . Prostate cancer Maternal Grandfather   . Kidney disease Sister   . Heart disease Paternal Grandfather   . Depression      Allergies  Allergen Reactions  . Adderall (Amphetamine-Dextroamphetamine)     pruritis  . Doxycycline Hyclate     REACTION: heartburn  . Meclizine Hcl     REACTION: urticaria (hives)  . Morphine   . Naproxen Sodium     REACTION: swelling,hives Per pt can now take with no reaction. May 2013    Current Outpatient Prescriptions on File Prior to Visit  Medication Sig Dispense Refill  . cyclobenzaprine (FLEXERIL) 10 MG tablet Take 1 tablet (10 mg total) by mouth 3 (three) times daily as needed for muscle spasms.  60 tablet  2  . DULoxetine (CYMBALTA) 60 MG capsule Take 180 mg by mouth daily.       Marland Kitchen LORazepam (ATIVAN) 1 MG tablet TAKE ONE TABLET BY MOUTH TWICE DAILY AS NEEDED FOR ANXIETY  60 tablet  0  . methylphenidate (RITALIN) 20 MG tablet Take 2 tablets in the morning and 1 in the afternoon  90 tablet  0  . ondansetron (ZOFRAN ODT) 8 MG  disintegrating tablet Take 1 tablet (8 mg total) by mouth every 8 (eight) hours as needed for nausea.  20 tablet  0  . oxyCODONE-acetaminophen (PERCOCET/ROXICET) 5-325 MG per tablet Take 1 tablet by mouth every 4 (four) hours as needed for pain.  6 tablet  0  . topiramate (TOPAMAX) 100 MG tablet TAKE ONE TABLET BY MOUTH ONCE DAILY  30 tablet  0  . tretinoin (RETIN-A) 0.05 % cream Apply 1 application topically at bedtime.       No current facility-administered medications on file prior to visit.    BP 104/80  Pulse 94  Wt 128 lb (58.06 kg)  BMI 24.2 kg/m2  SpO2 97%chart    Objective:   Physical Exam  Constitutional: She is oriented to person, place, and time. She appears well-developed and well-nourished.  HENT:  Head: Normocephalic and atraumatic.  Eyes: Pupils are equal, round, and reactive to light.  Neck: Normal range of motion.   Cardiovascular: Normal rate and regular rhythm.   Pulmonary/Chest: Effort normal and breath sounds normal.  Pain with inspiration  Abdominal: Soft. Bowel sounds are normal. There is tenderness.  Musculoskeletal: Normal range of motion.  Neurological: She is alert and oriented to person, place, and time.  Skin: Skin is warm and dry.          Assessment & Plan:  Assessment: 1. Right Upper Abdominal Pain 2. Nausea   Plan: Pt is ordered a H.pylori test due to the fact that pain is worsened by food and pt's stress load to evaluate for possible gastric ulcer. Pt was placed on a PPI to see if any relief of symptoms can be achieved. Pt also experienced a sharp pain to R shoulder area when asked to take a deep breath, a d-dimer test is ordered to evaluate for possible pulmonary embolism. Pt is instructed to follow up with PCP, and to contact provider if symptoms persist or worsen before follow up appointment

## 2013-01-28 NOTE — Telephone Encounter (Signed)
Patient Information:  Caller Name: Aleli  Phone: [phone]  Patient: Cynthia [name], Cynthia [name]  Gender: Female  DOB: 1970/12/23  Age: 42 Years  PCP: Adline Mango [facility])  Pregnant: No  Office Follow Up:  Does the office need to follow up with this patient?: No  Instructions For The Office: N/A  RN Note:  RN transferred the call to the office to schedule ER follow up appt. Office scheduled appt for 1330 today (01/28/13)  Symptoms  Reason For Call & Symptoms: pt was seen at [facility] on 01/23/13 for abdominal pain, nausea and vomiting.  Pt states she was told her Lipase was elevated.  Pt is still having pain that is radiating around to her back  Reviewed Health History In EMR: Yes  Reviewed Medications In EMR: Yes  Reviewed Allergies In EMR: Yes  Reviewed Surgeries / Procedures: Yes  Date of Onset of Symptoms: 01/23/2013 OB / GYN:  LMP: Unknown  Guideline(s) Used:  Abdominal Pain - Female  Disposition Per Guideline:   See Today in Office  Reason For Disposition Reached:   Moderate or mild pain that comes and goes (cramps) lasts > 24 hours  Advice Given:  N/A  Patient Will Follow Care Advice:  YES  Appointment Scheduled:  01/28/2013 13:30:00 Appointment Scheduled Provider:  Adline Mango [facility])

## 2013-01-29 ENCOUNTER — Telehealth: Payer: Self-pay | Admitting: Family

## 2013-01-29 ENCOUNTER — Other Ambulatory Visit: Payer: Self-pay | Admitting: Family

## 2013-01-29 ENCOUNTER — Ambulatory Visit (INDEPENDENT_AMBULATORY_CARE_PROVIDER_SITE_OTHER)
Admission: RE | Admit: 2013-01-29 | Discharge: 2013-01-29 | Disposition: A | Discharge status: Home / Self Care | Payer: 59 | Source: Ambulatory Visit | Attending: Family | Admitting: Family

## 2013-01-29 DIAGNOSIS — R079 Chest pain, unspecified: Secondary | ICD-10-CM

## 2013-01-29 MED ORDER — METHYLPREDNISOLONE 4 MG PO KIT: PACK | ORAL | Status: AC

## 2013-01-29 MED ORDER — IOHEXOL 350 MG/ML SOLN
80.0000 mL | Freq: Once | INTRAVENOUS | Status: AC | PRN
  Administered 2013-01-29: 80 mL via INTRAVENOUS

## 2013-01-29 NOTE — Telephone Encounter (Signed)
Caller: Cynthia [name]/Patient; Phone: [phone]; Reason for Call: Seen in office yesterday, some lab reports were elevated so had CT Scan to rule out pulmonary embolus, told negative and sent home.  Asking what plan of care is now for the continuing pain between her shoulder blades that is worse if sitting or lying down.

## 2013-01-29 NOTE — Telephone Encounter (Signed)
Pt aware.

## 2013-01-29 NOTE — Telephone Encounter (Signed)
I am going to send medication to the pharmacy to help with inflammation and pain.

## 2013-01-31 ENCOUNTER — Telehealth: Payer: Self-pay | Admitting: Pulmonary Disease

## 2013-01-31 MED ORDER — METHYLPHENIDATE HCL 20 MG PO TABS: ORAL_TABLET | ORAL | Status: DC

## 2013-01-31 NOTE — Telephone Encounter (Signed)
Pt last visit on 12-05-13, last refill on 12-26-12. Pt needs refill on ritalin today because she will run out before Monday. Please advise if you are ok to sign the prescription since [facility] out of office. Carron Curie, CMA

## 2013-01-31 NOTE — Telephone Encounter (Signed)
ok 

## 2013-01-31 NOTE — Telephone Encounter (Signed)
Refill okay per MW--  Ritalin 20mg  Take 2 tabs in AM and 1 tab in PM # 90  x 0 refills  Rx to be printed and signed by Sherene Sires.  LMOM to make pt aware this will placed up front to p/u

## 2013-02-04 ENCOUNTER — Ambulatory Visit
Admission: RE | Admit: 2013-02-04 | Discharge: 2013-02-04 | Disposition: A | Discharge status: Home / Self Care | Payer: 59 | Source: Ambulatory Visit | Attending: Obstetrics and Gynecology | Admitting: Obstetrics and Gynecology

## 2013-02-04 DIAGNOSIS — N63 Unspecified lump in unspecified breast: Secondary | ICD-10-CM

## 2013-02-13 ENCOUNTER — Ambulatory Visit (INDEPENDENT_AMBULATORY_CARE_PROVIDER_SITE_OTHER): Payer: 59 | Admitting: Family Medicine

## 2013-02-13 ENCOUNTER — Telehealth: Payer: Self-pay | Admitting: Family

## 2013-02-13 ENCOUNTER — Encounter: Payer: Self-pay | Admitting: Family Medicine

## 2013-02-13 VITALS — BP 88/60 | Temp 98.8°F | Wt 127.0 lb

## 2013-02-13 DIAGNOSIS — S46811A Strain of other muscles, fascia and tendons at shoulder and upper arm level, right arm, initial encounter: Secondary | ICD-10-CM

## 2013-02-13 DIAGNOSIS — S46819A Strain of other muscles, fascia and tendons at shoulder and upper arm level, unspecified arm, initial encounter: Secondary | ICD-10-CM

## 2013-02-13 DIAGNOSIS — S43499A Other sprain of unspecified shoulder joint, initial encounter: Secondary | ICD-10-CM

## 2013-02-13 NOTE — Telephone Encounter (Signed)
Noted  

## 2013-02-13 NOTE — Patient Instructions (Signed)
-  heat for 15 minutes twice daily  -ibuprofen 400mg -600mg  up to twice daily or tylenol 500-1000mg  up to 3 times daily use in conjunction with a topical sports cream (containg menthol or capsacin)  -do the exercises provided  -follow up with Padonda in 3-4 weeks

## 2013-02-13 NOTE — Telephone Encounter (Signed)
Patient Information:  Caller Name: Cynthia [name]  Phone: [phone]  Patient: Cynthia [name], Cynthia [name]  Gender: Female  DOB: Feb 01, 1971  Age: 42 Years  PCP: Adline Mango [facility])  Pregnant: No  Office Follow Up:  Does the office need to follow up with this patient?: No  Instructions For The Office: N/A  RN Note:  States her neck pain has flared; seen in office 11/13/12 by Dr. Clent Ridges and prescribed flexeril, but states that was not very helpful.  Neck pain began again 1 week ago.  States neck is sore and a bit stiff.  Per neck pain protocol, emergent symptoms denied; advised appt within 72 hours.  Appt scheduled 02/13/13 1545 (has appt with client before then and cannot make it to office until 1545) with Dr. Selena Batten.  krs/can  Symptoms  Reason For Call & Symptoms: neck pain flare  Reviewed Health History In EMR: Yes  Reviewed Medications In EMR: Yes  Reviewed Allergies In EMR: Yes  Reviewed Surgeries / Procedures: Yes  Date of Onset of Symptoms: 02/06/2013 OB / GYN:  LMP: Unknown  Guideline(s) Used:  Neck Pain or Stiffness  Disposition Per Guideline:   See Within 3 Days in Office  Reason For Disposition Reached:   Moderate neck pain (e.g., interferes with normal activities like work or school)  Advice Given:  N/A  Patient Will Follow Care Advice:  YES  Appointment Scheduled:  02/13/2013 15:45:00 Appointment Scheduled Provider:  Kriste Basque (Family Practice)

## 2013-02-13 NOTE — Progress Notes (Signed)
Chief Complaint  Patient presents with  . Neck Pain    HPI:  Acute visit for neck pain: 42 yo f pt of Pad[facility] Campbell's with PMH sig anxiety and depression here for acute "pulled muscle or pinched nerve" -started: about 4-5 days ago after"slept on it wrong" -symptoms: pain in R side of neck and R trap muscle, sharp achy pain, moderate pain -worse at end of day, better with percocet (had this on hand from prior issues), heat -denies: fevers, chills, malaise, weakness, numbness -denies any hx of liver or kidney problems   ROS: See pertinent positives and negatives per HPI.  Past Medical History  Diagnosis Date  . Anxiety and depression 04/03/2008  . Acne   . Hemorrhoids   . Basal cell cancer     Family History  Problem Relation Age of Onset  . Prostate cancer Maternal Grandfather   . Kidney disease Sister   . Heart disease Paternal Grandfather   . Depression      History   Social History  . Marital Status: Married    Spouse Name: N/A    Number of Children: N/A  . 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 Smoking status: Former Smoker -- 1.00 packs/day for 10 years    Types: Cigarettes    Quit date: 08/15/2003  . Smokeless tobacco: Never Used  . Alcohol Use: Yes     Comment: rare  . Drug Use: No  . Sexually Active: None   Other Topics Concern  . None   Social History Narrative  . None    Current outpatient prescriptions:cyclobenzaprine (FLEXERIL) 10 MG tablet, Take 1 tablet (10 mg total) by mouth 3 (three) times daily as needed for muscle spasms., Disp: 60 tablet, Rfl: 2;  DULoxetine (CYMBALTA) 60 MG capsule, Take 180 mg by mouth daily. , Disp: , Rfl: ;  LORazepam (ATIVAN) 1 MG tablet, TAKE ONE TABLET BY MOUTH TWICE DAILY AS NEEDED FOR ANXIETY, Disp: 60 tablet, Rfl: 0 methylphenidate (RITALIN) 20 MG tablet, Take 2 tablets in the morning and 1 in the afternoon, Disp: 90 tablet, Rfl: 0;  tretinoin  (RETIN-A) 0.05 % cream, Apply 1 application topically at bedtime., Disp: , Rfl: ;  ondansetron (ZOFRAN ODT) 8 MG disintegrating tablet, Take 1 tablet (8 mg total) by mouth every 8 (eight) hours as needed for nausea., Disp: 20 tablet, Rfl: 0 oxyCODONE-acetaminophen (PERCOCET/ROXICET) 5-325 MG per tablet, Take 1 tablet by mouth every 4 (four) hours as needed for pain., Disp: 6 tablet, Rfl: 0;  topiramate (TOPAMAX) 100 MG tablet, TAKE ONE TABLET BY MOUTH ONCE DAILY, Disp: 30 tablet, Rfl: 0  EXAM:  Filed Vitals:   02/13/13 1623  BP: 88/60  Temp: 98.8 F (37.1 C)    Body mass index is 24.01 kg/(m^2).  GENERAL: vitals reviewed and listed above, alert, oriented, appears well hydrated and in no acute distress  HEENT: atraumatic, conjunttiva clear, no obvious abnormalities on inspection of external nose and ears  NECK: no obvious masses on inspection  MS: moves all extremities without noticeable abnormality -ROM in head and neck normal -normal inspection of head and neck and UEs except for head forward, rounded shoulder posture -TTP R trapezius muscle with TPs -no bony TTP, neg Spurling -no sensation to light touch and muscle strength in bilateral upper extremities  PSYCH: pleasant and cooperative, no obvious depression or anxiety  ASSESSMENT AND PLAN:  Discussed the following assessment and  plan:  Trapezius muscle strain, right, initial encounter  -Recommendations per orders an instructions, risks and use of medications and return precautions discussed. -discussed considering PMR for TP inj if persists -Patient advised to return or notify a doctor immediately if symptoms worsen or persist or new concerns arise.  Patient Instructions  -heat for 15 minutes twice daily  -ibuprofen 400mg -600mg  up to twice daily or tylenol 500-1000mg  up to 3 times daily use in conjunction with a topical sports cream (containg menthol or capsacin)  -do the exercises provided  -follow up with Padonda  in 3-4 weeks     Shawnie Nicole R.

## 2013-02-24 ENCOUNTER — Other Ambulatory Visit: Payer: Self-pay | Admitting: Family

## 2013-02-24 NOTE — Telephone Encounter (Signed)
Left message to advise pt PCP is out of the office this week and to let me know if she does not have enough to last this week

## 2013-03-04 ENCOUNTER — Telehealth: Payer: Self-pay | Admitting: Pulmonary Disease

## 2013-03-04 MED ORDER — METHYLPHENIDATE HCL 20 MG PO TABS: ORAL_TABLET | ORAL | Status: DC

## 2013-03-04 NOTE — Telephone Encounter (Signed)
Last OV 12-06-12 with ROV in 1 year, last RX 01-31-13. Rx printed and placed in [facility] look-at to sign. Pt wants to pick-up when ready. Carron Curie, CMA

## 2013-03-04 NOTE — Telephone Encounter (Signed)
Ritalin rx signed by Dr. Shelle Iron and is at front for pick up.  Pt aware.

## 2013-03-10 ENCOUNTER — Other Ambulatory Visit: Payer: Self-pay | Admitting: Family

## 2013-03-31 ENCOUNTER — Telehealth: Payer: Self-pay | Admitting: Pulmonary Disease

## 2013-03-31 MED ORDER — METHYLPHENIDATE HCL 20 MG PO TABS: ORAL_TABLET | ORAL | Status: DC

## 2013-03-31 NOTE — Telephone Encounter (Signed)
yes

## 2013-03-31 NOTE — Telephone Encounter (Signed)
Printed for [facility] to sign. Pt is aware that her rx is ready to be picked up.

## 2013-03-31 NOTE — Telephone Encounter (Signed)
Last OV 12/06/12 Pending OV 12/08/13 Last fill was 03/04/13  Allergies  Allergen Reactions  . Adderall [Amphetamine-Dextroamphetamine]     pruritis  . Doxycycline Hyclate     REACTION: heartburn  . Meclizine Hcl     REACTION: urticaria (hives)  . Morphine   . Naproxen Sodium     REACTION: swelling,hives Per pt can now take with no reaction. May 2013    [facility] - please advise if this will be okay to fill. Thanks.

## 2013-04-04 ENCOUNTER — Other Ambulatory Visit: Payer: Self-pay | Admitting: Family

## 2013-04-19 ENCOUNTER — Emergency Department (HOSPITAL_BASED_OUTPATIENT_CLINIC_OR_DEPARTMENT_OTHER)
Admission: EM | Admit: 2013-04-19 | Discharge: 2013-04-19 | Disposition: A | Discharge status: Home / Self Care | Payer: 59 | Attending: Emergency Medicine | Admitting: Emergency Medicine

## 2013-04-19 ENCOUNTER — Encounter (HOSPITAL_BASED_OUTPATIENT_CLINIC_OR_DEPARTMENT_OTHER): Payer: Self-pay

## 2013-04-19 DIAGNOSIS — Z87891 Personal history of nicotine dependence: Secondary | ICD-10-CM | POA: Insufficient documentation

## 2013-04-19 DIAGNOSIS — Z8679 Personal history of other diseases of the circulatory system: Secondary | ICD-10-CM | POA: Insufficient documentation

## 2013-04-19 DIAGNOSIS — Z85828 Personal history of other malignant neoplasm of skin: Secondary | ICD-10-CM | POA: Insufficient documentation

## 2013-04-19 DIAGNOSIS — Z79899 Other long term (current) drug therapy: Secondary | ICD-10-CM | POA: Insufficient documentation

## 2013-04-19 DIAGNOSIS — Z872 Personal history of diseases of the skin and subcutaneous tissue: Secondary | ICD-10-CM | POA: Insufficient documentation

## 2013-04-19 DIAGNOSIS — N10 Acute tubulo-interstitial nephritis: Secondary | ICD-10-CM | POA: Insufficient documentation

## 2013-04-19 DIAGNOSIS — F341 Dysthymic disorder: Secondary | ICD-10-CM | POA: Insufficient documentation

## 2013-04-19 LAB — URINE MICROSCOPIC-ADD ON

## 2013-04-19 LAB — BASIC METABOLIC PANEL
BUN: 18 mg/dL (ref 6–23)
Chloride: 105 mEq/L (ref 96–112)
GFR calc Af Amer: >90 mL/min (ref >90)
GFR calc non Af Amer: >90 mL/min (ref >90)
Glucose, Bld: 96 mg/dL (ref 70–99)
Potassium: 3.7 mEq/L (ref 3.5–5.1)
Sodium: 142 mEq/L (ref 135–145)

## 2013-04-19 LAB — CBC WITH DIFFERENTIAL/PLATELET
Basophils Relative: 0 % (ref 0–1)
Eosinophils Absolute: 0.1 10*3/uL (ref 0.0–0.7)
Hemoglobin: 12.4 g/dL (ref 12.0–15.0)
Lymphs Abs: 1.4 10*3/uL (ref 0.7–4.0)
MCH: 31 pg (ref 26.0–34.0)
Monocytes Relative: 7 % (ref 3–12)
Neutro Abs: 5.9 10*3/uL (ref 1.7–7.7)
Neutrophils Relative %: 75 % (ref 43–77)
Platelets: 236 10*3/uL (ref 150–400)
RBC: 4 MIL/uL (ref 3.87–5.11)

## 2013-04-19 LAB — URINALYSIS, ROUTINE W REFLEX MICROSCOPIC
Glucose, UA: NEGATIVE mg/dL
Nitrite: NEGATIVE
Specific Gravity, Urine: 1.015 (ref 1.005–1.030)
pH: 8.5 — ABNORMAL HIGH (ref 5.0–8.0)

## 2013-04-19 MED ORDER — FENTANYL CITRATE 0.05 MG/ML IJ SOLN
50.0000 ug | Freq: Once | INTRAMUSCULAR | Status: AC
  Administered 2013-04-19: 50 ug via INTRAVENOUS
  Filled 2013-04-19: qty 2

## 2013-04-19 MED ORDER — ONDANSETRON HCL 4 MG PO TABS: 4.0000 mg | ORAL_TABLET | Freq: Three times a day (TID) | ORAL | Status: DC | PRN

## 2013-04-19 MED ORDER — DEXTROSE 5 % IV SOLN
1.0000 g | Freq: Once | INTRAVENOUS | Status: AC
  Administered 2013-04-19: 1 g via INTRAVENOUS
  Filled 2013-04-19: qty 10

## 2013-04-19 MED ORDER — OXYCODONE-ACETAMINOPHEN 5-325 MG PO TABS: 2.0000 | ORAL_TABLET | ORAL | Status: DC | PRN

## 2013-04-19 MED ORDER — ONDANSETRON HCL 4 MG/2ML IJ SOLN
4.0000 mg | Freq: Once | INTRAMUSCULAR | Status: AC
  Administered 2013-04-19: 4 mg via INTRAVENOUS
  Filled 2013-04-19: qty 2

## 2013-04-19 MED ORDER — SODIUM CHLORIDE 0.9 % IV BOLUS (SEPSIS)
1000.0000 mL | Freq: Once | INTRAVENOUS | Status: AC
  Administered 2013-04-19: 1000 mL via INTRAVENOUS

## 2013-04-19 MED ORDER — CEPHALEXIN 500 MG PO CAPS: 500.0000 mg | ORAL_CAPSULE | Freq: Four times a day (QID) | ORAL | Status: DC

## 2013-04-19 NOTE — ED Provider Notes (Signed)
CSN: [id number]     Arrival date & time 04/19/13  1006 History   First MD Initiated Contact with Patient 04/19/13 1024     Chief Complaint  Patient presents with  . Dysuria   (Consider location/radiation/quality/duration/timing/severity/associated sxs/prior Treatment) Patient is a 42 y.o. female presenting with dysuria.  Dysuria  Pt reports 2-3 days of dysuria and foul smelling urine now also associated with hematuria, lower abdomen pain and R flank pain. No fever or vomiting. She has never had UTI or kidney stone. Seen in the ED about 2.5 months ago for abdominal pain and had CT then which did not demonstrate any renal stones, but heterogenous kidneys seen although UA was neg. She has had hysterectomy and R oophorectomy. No vaginal bleeding or discharge.    Past Medical History  Diagnosis Date  . Anxiety and depression 04/03/2008  . Acne   . Hemorrhoids   . Basal cell cancer    Past Surgical History  Procedure Laterality Date  . Partial hysterectomy    . Bunionectomy    . Cesarean section      x 2  . Skin cancer excision     Family History  Problem Relation Age of Onset  . Prostate cancer Maternal Grandfather   . Kidney disease Sister   . Heart disease Paternal Grandfather   . Depression     History  Substance Use Topics  . Smoking status: Former Smoker -- 1.00 packs/day for 10 years    Types: Cigarettes    Quit date: 08/15/2003  . Smokeless tobacco: Never Used  . Alcohol Use: Yes     Comment: rare   OB History   Grav Para Term Preterm Abortions TAB SAB Ect Mult Living                 Review of Systems  Genitourinary: Positive for dysuria.   All other systems reviewed and are negative except as noted in HPI.   Allergies  Adderall; Doxycycline hyclate; Meclizine hcl; Morphine; and Naproxen sodium  Home Medications   Current Outpatient Rx  Name  Route  Sig  Dispense  Refill  . CYMBALTA 60 MG capsule      TAKE TWO CAPSULES BY MOUTH AT BEDTIME   60 capsule   3   . EXPIRED: DULoxetine (CYMBALTA) 60 MG capsule   Oral   Take 180 mg by mouth daily.          Marland Kitchen LORazepam (ATIVAN) 1 MG tablet      TAKE ONE TABLET BY MOUTH TWICE DAILY AS NEEDED FOR ANXIETY.   60 tablet   0   . methylphenidate (RITALIN) 20 MG tablet      Take 2 tablets in the morning and 1 in the afternoon   90 tablet   0   . tretinoin (RETIN-A) 0.05 % cream   Topical   Apply 1 application topically at bedtime.          BP 112/73  Pulse 78  Temp(Src) 98.4 F (36.9 C) (Oral)  Resp 20  SpO2 100% Physical Exam  Nursing note and vitals reviewed. Constitutional: She is oriented to person, place, and time. She appears well-developed and well-nourished.  HENT:  Head: Normocephalic and atraumatic.  Eyes: EOM are normal. Pupils are equal, round, and reactive to light.  Neck: Normal range of motion. Neck supple.  Cardiovascular: Normal rate, normal heart sounds and intact distal pulses.   Pulmonary/Chest: Effort normal and breath sounds normal.  Abdominal: Bowel sounds are  normal. She exhibits no distension. There is tenderness (mild suprapubic tenderness). There is no rebound and no guarding.  Genitourinary:  R CVA tenderness  Musculoskeletal: Normal range of motion. She exhibits no edema and no tenderness.  Neurological: She is alert and oriented to person, place, and time. She has normal strength. No cranial nerve deficit or sensory deficit.  Skin: Skin is warm and dry. No rash noted.  Psychiatric: She has a normal mood and affect.    ED Course  Procedures (including critical care time) Labs Review Labs Reviewed  URINALYSIS, ROUTINE W REFLEX MICROSCOPIC - Abnormal; Notable for the following:    APPearance CLOUDY (*)    pH 8.5 (*)    Hgb urine dipstick LARGE (*)    Protein, ur 100 (*)    Leukocytes, UA MODERATE (*)    All other components within normal limits  URINE MICROSCOPIC-ADD ON - Abnormal; Notable for the following:    Bacteria, UA FEW (*)    All  other components within normal limits  URINE CULTURE  CBC WITH DIFFERENTIAL  BASIC METABOLIC PANEL   Imaging Review No results found.  MDM   1. Pyelonephritis, acute     UA shows infection and blood. Doubt this is renal stone given recent CT without stones. She likely has early pyelonephritis. Will check labs, IVF, IV Abx and pain medications.   12:47 PM Labs unremarkable, pt feeling better. No vomiting or fever here. Will treat with Keflex. Urine sent for culture. Pain and nausea meds if needed.   Charles B. Bernette Mayers, MD 04/19/13 1248

## 2013-04-19 NOTE — ED Notes (Signed)
Patient here with dysuria, hematuria, lower pelvic pain and back pain x 2 days. Reports urinary frequency with same. No hx of uti or kidney stone.

## 2013-04-22 LAB — URINE CULTURE

## 2013-05-02 ENCOUNTER — Encounter: Payer: Self-pay | Admitting: Family

## 2013-05-02 ENCOUNTER — Ambulatory Visit (INDEPENDENT_AMBULATORY_CARE_PROVIDER_SITE_OTHER): Payer: 59 | Admitting: Family

## 2013-05-02 ENCOUNTER — Telehealth: Payer: Self-pay | Admitting: Pulmonary Disease

## 2013-05-02 VITALS — BP 96/60 | HR 84 | Wt 138.0 lb

## 2013-05-02 DIAGNOSIS — F411 Generalized anxiety disorder: Secondary | ICD-10-CM

## 2013-05-02 DIAGNOSIS — N390 Urinary tract infection, site not specified: Secondary | ICD-10-CM

## 2013-05-02 DIAGNOSIS — Z23 Encounter for immunization: Secondary | ICD-10-CM

## 2013-05-02 DIAGNOSIS — G47419 Narcolepsy without cataplexy: Secondary | ICD-10-CM

## 2013-05-02 LAB — POCT URINALYSIS DIPSTICK
Bilirubin, UA: NEGATIVE
Blood, UA: NEGATIVE
Ketones, UA: NEGATIVE
Leukocytes, UA: NEGATIVE
Spec Grav, UA: 1.025
pH, UA: 6

## 2013-05-02 MED ORDER — METHYLPHENIDATE HCL 20 MG PO TABS: ORAL_TABLET | ORAL | Status: DC

## 2013-05-02 MED ORDER — CLONAZEPAM 0.5 MG PO TABS: 0.5000 mg | ORAL_TABLET | Freq: Two times a day (BID) | ORAL | Status: DC | PRN

## 2013-05-02 NOTE — Telephone Encounter (Signed)
Last OV 12-06-12 with rov in 1 year. LAst refill on 03-31-13. Pt requesting refill. Rx printed and placed on [facility] desk to sign. Pt will come pick up rx today at 3pm. No need to call pt. Carron Curie, CMA

## 2013-05-05 NOTE — Progress Notes (Signed)
Subjective:    Patient ID: Cynthia [name], female    DOB: 1971/01/16, 42 y.o.   MRN: [id number]  HPI  42 year old white female, nonsmoker then for for recheck of anxiety and depression. Reports she is having greater difficulty with anxiety and is beginning to have skin taking that she is unable to control. Takes Ativan 1 mg as needed but feels that the medication is not working anymore. She also takes Methylphenidate for narcolepsy. Takes Cymbalta 120 mg once daily  Patient was also seen in the emergency department with urinary tract infection with a 3 year and recheck today.  Review of Systems  Constitutional: Negative.   Respiratory: Negative.   Cardiovascular: Negative.   Musculoskeletal: Negative.   Skin: Negative.        Skin picking  Allergic/Immunologic: Negative.   Neurological: Negative.   Hematological: Negative.   Psychiatric/Behavioral: Positive for agitation. Negative for confusion and sleep disturbance.   Past Medical History  Diagnosis Date  . Anxiety and depression 04/03/2008  . Acne   . Hemorrhoids   . Basal cell cancer     History   Social History  . Marital Status: Married    Spouse Name: N/A    Number of Children: N/A  . 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 Smoking status: Former Smoker -- 1.00 packs/day for 10 years    Types: Cigarettes    Quit date: 08/15/2003  . Smokeless tobacco: Never Used  . Alcohol Use: Yes     Comment: rare  . Drug Use: No  . Sexual Activity: Not on file   Other Topics Concern  . Not on file   Social History Narrative  . No narrative on file    Past Surgical History  Procedure Laterality Date  . Partial hysterectomy    . Bunionectomy    . Cesarean section      x 2  . Skin cancer excision      Family History  Problem Relation Age of Onset  . Prostate cancer Maternal Grandfather   . Kidney disease Sister   . Heart disease Paternal Grandfather    . Depression      Allergies  Allergen Reactions  . Adderall [Amphetamine-Dextroamphetamine]     pruritis  . Doxycycline Hyclate     REACTION: heartburn  . Meclizine Hcl     REACTION: urticaria (hives)  . Morphine   . Naproxen Sodium     REACTION: swelling,hives Per pt can now take with no reaction. May 2013    Current Outpatient Prescriptions on File Prior to Visit  Medication Sig Dispense Refill  . cephALEXin (KEFLEX) 500 MG capsule Take 1 capsule (500 mg total) by mouth 4 (four) times daily.  28 capsule  0  . CYMBALTA 60 MG capsule TAKE TWO CAPSULES BY MOUTH AT BEDTIME  60 capsule  3  . ondansetron (ZOFRAN) 4 MG tablet Take 1 tablet (4 mg total) by mouth every 8 (eight) hours as needed for nausea.  12 tablet  0  . DULoxetine (CYMBALTA) 60 MG capsule Take 180 mg by mouth daily.       Marland Kitchen oxyCODONE-acetaminophen (PERCOCET/ROXICET) 5-325 MG per tablet Take 2 tablets by mouth every 4 (four) hours as needed for pain.  15 tablet  0  . tretinoin (RETIN-A) 0.05 % cream Apply 1 application topically at bedtime.       No current facility-administered medications on file prior  to visit.    BP 96/60  Pulse 84  Wt 138 lb (62.596 kg)  BMI 26.09 kg/m2chart    Objective:   Physical Exam  Constitutional: She is oriented to person, place, and time. She appears well-developed and well-nourished.  HENT:  Right Ear: External ear normal.  Left Ear: External ear normal.  Nose: Nose normal.  Mouth/Throat: Oropharynx is clear and moist.  Neck: Normal range of motion. Neck supple.  Cardiovascular: Normal rate, regular rhythm and normal heart sounds.   Pulmonary/Chest: Effort normal and breath sounds normal.  Neurological: She is alert and oriented to person, place, and time.  Skin: Skin is warm and dry.  Psychiatric: She has a normal mood and affect.  Appears nervous.           Assessment & Plan:  Assessment: 1. Anxiety 2. Depression 3. Narcolepsy  Plan: DC Ativan and start  Klonopin 0.5 mg twice a day as needed. Continue Cymbalta. Consider doxepin if symptoms persist. Call the office with any questions or concerns. Check as scheduled, and as needed.

## 2013-05-16 ENCOUNTER — Ambulatory Visit: Payer: Self-pay | Admitting: Family

## 2013-05-16 DIAGNOSIS — Z0289 Encounter for other administrative examinations: Secondary | ICD-10-CM

## 2013-05-29 ENCOUNTER — Telehealth: Payer: Self-pay | Admitting: Pulmonary Disease

## 2013-05-29 NOTE — Telephone Encounter (Signed)
Returning call.

## 2013-05-29 NOTE — Telephone Encounter (Signed)
I spoke with pt. She stated the ritalin 20 mg is making her feel sleepy and tired. She is requesting to go back to adderral. Pt reports she has been taking the ritalin 20 mg 4 tabs daily and still not helping. She is aware Dr. Shelle Iron is out of the office. Please advise thanks

## 2013-05-29 NOTE — Telephone Encounter (Signed)
lmomtcb x1 

## 2013-05-30 MED ORDER — AMPHETAMINE-DEXTROAMPHET ER 25 MG PO CP24: 25.0000 mg | ORAL_CAPSULE | Freq: Every day | ORAL | Status: DC

## 2013-05-30 NOTE — Telephone Encounter (Signed)
lmomtcb  

## 2013-05-30 NOTE — Telephone Encounter (Signed)
Pt returned call-is @ work now & asked to be reached at [phone].  Cynthia [name]

## 2013-05-30 NOTE — Telephone Encounter (Signed)
Ok to go back to adderall XR 25mg  one each am. Remind her that pt's with narcolepsy are never completely alert.  She needs to try and take 15-74min naps during the day as able, and to make sure she is getting 7-8 hrs of sleep every night if able.   #30, no fills.

## 2013-05-30 NOTE — Telephone Encounter (Signed)
Pt advised and rx printed and placed at front. Carron Curie, CMA

## 2013-06-13 ENCOUNTER — Telehealth: Payer: Self-pay | Admitting: Pulmonary Disease

## 2013-06-13 NOTE — Telephone Encounter (Signed)
lmomtcb x1 

## 2013-06-13 NOTE — Telephone Encounter (Signed)
Pt called back and she stated that [facility] put her on the adderall er 25 mg and she stated that this is not working for her.  She fell asleep sitting up yesterday.  She stated that [facility] had her on the adderall 90 mg and would like to see if she could go back on this.  She also stated that [facility] sent her a letter and told her that she will need a repeat sleep study by 11/28.  Pt wanted to see if [facility] would order this for her as well.  She is aware that [facility] is out of the office until Monday and is ok to wait until then.  KC please advise. Thanks  Allergies  Allergen Reactions  . Adderall [Amphetamine-Dextroamphetamine]     pruritis  . Doxycycline Hyclate     REACTION: heartburn  . Meclizine Hcl     REACTION: urticaria (hives)  . Morphine   . Naproxen Sodium     REACTION: swelling,hives Per pt can now take with no reaction. May 2013     Current Outpatient Prescriptions on File Prior to Visit  Medication Sig Dispense Refill  . amphetamine-dextroamphetamine (ADDERALL XR) 25 MG 24 hr capsule Take 1 capsule (25 mg total) by mouth daily.  30 capsule  0  . cephALEXin (KEFLEX) 500 MG capsule Take 1 capsule (500 mg total) by mouth 4 (four) times daily.  28 capsule  0  . clonazePAM (KLONOPIN) 0.5 MG tablet Take 1 tablet (0.5 mg total) by mouth 2 (two) times daily as needed for anxiety.  60 tablet  1  . CYMBALTA 60 MG capsule TAKE TWO CAPSULES BY MOUTH AT BEDTIME  60 capsule  3  . DULoxetine (CYMBALTA) 60 MG capsule Take 180 mg by mouth daily.       . methylphenidate (RITALIN) 20 MG tablet Take 2 tablets in the morning and 1 in the afternoon  90 tablet  0  . ondansetron (ZOFRAN) 4 MG tablet Take 1 tablet (4 mg total) by mouth every 8 (eight) hours as needed for nausea.  12 tablet  0  . oxyCODONE-acetaminophen (PERCOCET/ROXICET) 5-325 MG per tablet Take 2 tablets by mouth every 4 (four) hours as needed for pain.  15 tablet  0  . tretinoin (RETIN-A) 0.05 % cream Apply 1 application topically at bedtime.       No  current facility-administered medications on file prior to visit.

## 2013-06-16 ENCOUNTER — Encounter: Payer: Self-pay | Admitting: Pulmonary Disease

## 2013-06-16 NOTE — Telephone Encounter (Signed)
I think she needs ov to discuss her meds. She also needs to bring documentation from [facility] with her regarding what test they are requiring.

## 2013-06-17 NOTE — Telephone Encounter (Signed)
Called, spoke with pt.  Explained below per [facility] to her.  She verbalized understanding.  We have scheduled pt to see [facility] tomorrow, Nov 5 at 9 am.  Pt aware and will bring letter from [facility] with the requirements to OV.

## 2013-06-17 NOTE — Telephone Encounter (Signed)
lmomtcb x1 for pt 

## 2013-06-17 NOTE — Telephone Encounter (Signed)
Returning call says ok to lmom.Cynthia [name]

## 2013-06-18 ENCOUNTER — Ambulatory Visit (INDEPENDENT_AMBULATORY_CARE_PROVIDER_SITE_OTHER): Payer: 59 | Admitting: Pulmonary Disease

## 2013-06-18 ENCOUNTER — Encounter: Payer: Self-pay | Admitting: Pulmonary Disease

## 2013-06-18 ENCOUNTER — Encounter (INDEPENDENT_AMBULATORY_CARE_PROVIDER_SITE_OTHER): Payer: Self-pay

## 2013-06-18 VITALS — BP 98/66 | HR 80 | Temp 98.1°F | Ht 61.75 in | Wt 147.0 lb

## 2013-06-18 DIAGNOSIS — G47419 Narcolepsy without cataplexy: Secondary | ICD-10-CM

## 2013-06-18 NOTE — Progress Notes (Signed)
  Subjective:    Patient ID: Cynthia [name], female    DOB: [birth year], 42 y.o.   MRN: [id number]  HPI The patient comes in today for an acute sick visit.  She has known narcolepsy, and continues to have significant daytime sleepiness that interferes with her work and quality of life despite stimulant medication.  She has been tried on 3 different medications at fairly high dose, and currently is on Adderall.  She tells me that she is getting adequate sleep at night and is following good sleep hygiene measures.  She also needs a form filled out from the [facility] regarding her diagnosis.   Review of Systems  Constitutional: Positive for fatigue. Negative for fever and unexpected weight change.  HENT: Negative for congestion, dental problem, ear pain, nosebleeds, postnasal drip, rhinorrhea, sinus pressure, sneezing, sore throat and trouble swallowing.   Eyes: Negative for redness and itching.  Respiratory: Negative for cough, chest tightness, shortness of breath and wheezing.   Cardiovascular: Negative for palpitations and leg swelling.  Gastrointestinal: Negative for nausea and vomiting.  Genitourinary: Negative for dysuria.  Musculoskeletal: Negative for joint swelling.  Skin: Negative for rash.  Neurological: Negative for headaches.  Hematological: Does not bruise/bleed easily.  Psychiatric/Behavioral: Negative for dysphoric mood. The patient is not nervous/anxious.        Objective:   Physical Exam Overweight female in no acute distress Nose without purulent discharge noted Neck without lymphadenopathy or thyromegaly Lower extremities without edema, no cyanosis Minimally sleepy, answers questions appropriately, moves all 4 extremities.       Assessment & Plan:

## 2013-06-18 NOTE — Patient Instructions (Signed)
Will refer you to Dr. Vickey Huger for a second opinion regarding your narcolepsy. Continue current stimulant medication for now, and continue to work on good sleep hygiene.

## 2013-06-18 NOTE — Assessment & Plan Note (Signed)
NPSG 2013:  No SDB, movement disorder, or other sleep disruptions. MSLT 2013:  MSL 3.52min, 4/5 SOREMS Trial of nuvigil:  Failed due to H/A and poor clinical response Started adderall with improvement >> couldn't tolerate >> then retried and initially did well.  Ritalin >> not enough efficacy.     The patient has continued to have issues with significant daytime sleepiness which is interfering with her ability to function.  She has been tried on nuvigil without success, did not respond adequately to Ritalin despite a high dose, and finally did not respond adequately to extended release Adderall.  At this point, I would like to have her see neurology for a second opinion, and to consider other additional treatments?  She tells me that she has been sticking with good sleep hygiene and getting adequate sleep at night.

## 2013-06-20 ENCOUNTER — Telehealth: Payer: Self-pay | Admitting: Pulmonary Disease

## 2013-06-20 NOTE — Telephone Encounter (Signed)
Pt called back. She reports she will call there office for appt since she knows her schedule. Will sign off message

## 2013-06-20 NOTE — Telephone Encounter (Signed)
I spoke with pt. She reports she takes adderall 25 mg daily. She states she dozed off to sleep 4 times driving to work this morning. She can't get in with Dr. Richardean Chimera until 07/04/13. Pt is wanting to know what else can be done until then. I called Dr. Juluis Mire office to see if pt can get in sooner for an appt. I spoke with Bradly Bienenstock and states she the doctor will not be back into the office until 07/04/13. Per Bradly Bienenstock Dr. Almyra Brace could see pt as early as Monday. Spoke with [facility] and he is fine with her seeing another physician. Almyra Free is working on getting her a sooner appt.  I called pt and LMTCB x1 to make her aware she will need to see nuero for sooner appt

## 2013-06-23 ENCOUNTER — Ambulatory Visit (INDEPENDENT_AMBULATORY_CARE_PROVIDER_SITE_OTHER): Payer: 59 | Admitting: Family

## 2013-06-23 ENCOUNTER — Ambulatory Visit (INDEPENDENT_AMBULATORY_CARE_PROVIDER_SITE_OTHER): Payer: 59 | Admitting: Neurology

## 2013-06-23 ENCOUNTER — Encounter: Payer: Self-pay | Admitting: Neurology

## 2013-06-23 ENCOUNTER — Encounter: Payer: Self-pay | Admitting: Family

## 2013-06-23 VITALS — BP 98/67 | HR 98 | Temp 98.5°F | Ht 62.0 in | Wt 146.0 lb

## 2013-06-23 VITALS — BP 98/60 | HR 94 | Wt 146.0 lb

## 2013-06-23 DIAGNOSIS — F988 Other specified behavioral and emotional disorders with onset usually occurring in childhood and adolescence: Secondary | ICD-10-CM

## 2013-06-23 DIAGNOSIS — F4323 Adjustment disorder with mixed anxiety and depressed mood: Secondary | ICD-10-CM

## 2013-06-23 DIAGNOSIS — F411 Generalized anxiety disorder: Secondary | ICD-10-CM | POA: Insufficient documentation

## 2013-06-23 DIAGNOSIS — G47411 Narcolepsy with cataplexy: Secondary | ICD-10-CM

## 2013-06-23 DIAGNOSIS — R51 Headache: Secondary | ICD-10-CM

## 2013-06-23 MED ORDER — AMPHETAMINE-DEXTROAMPHETAMINE 20 MG PO TABS: 20.0000 mg | ORAL_TABLET | Freq: Two times a day (BID) | ORAL | Status: DC

## 2013-06-23 MED ORDER — BACLOFEN 10 MG PO TABS: 10.0000 mg | ORAL_TABLET | Freq: Three times a day (TID) | ORAL | Status: DC

## 2013-06-23 NOTE — Patient Instructions (Signed)
Muscle Strain  Muscle strain occurs when a muscle is stretched beyond its normal length. A small number of muscle fibers generally are torn. This is especially common in athletes. This happens when a sudden, violent force placed on a muscle stretches it too far. Usually, recovery from muscle strain takes 1 to 2 weeks. Complete healing will take 5 to 6 weeks.   HOME CARE INSTRUCTIONS    While awake, apply ice to the sore muscle for the first 2 days after the injury.   Put ice in a plastic bag.   Place a towel between your skin and the bag.   Leave the ice on for 15-20 minutes each hour.   Do not use the strained muscle for several days, until you no longer have pain.   You may wrap the injured area with an elastic bandage for comfort. Be careful not to wrap it too tightly. This may interfere with blood circulation or increase swelling.   Only take over-the-counter or prescription medicines for pain, discomfort, or fever as directed by your caregiver.  SEEK MEDICAL CARE IF:   You have increasing pain or swelling in the injured area.  MAKE SURE YOU:    Understand these instructions.   Will watch your condition.   Will get help right away if you are not doing well or get worse.  Document Released: 07/31/2005 Document Revised: 10/23/2011 Document Reviewed: 08/12/2011  [employer] Patient Information 2014 [employer].
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
## 2013-06-23 NOTE — Progress Notes (Signed)
Pre-visit discussion using our clinic review tool. No additional management support is needed unless otherwise documented below in the visit note.  

## 2013-06-23 NOTE — Patient Instructions (Addendum)
You have narcolepsy: This means, that you have a sleep disorder that manifests with at times severe excessive sleepiness during the day and problem with sleep at night. We may have to try different medications that may help you stay awake during the day. Not everything works with everybody the same way. Wake promoting agents include stimulants and non-stimulant type medications. The most common side effects with stimulants are weight loss, insomnia, nervousness, headaches, palpitations, rise in blood pressure, anxiety. Stimulants can be addictive and subject to abuse. Non-stimulant type wake promoting medications include Provigil and Nuvigil, most common side effects include headaches, nervousness, insomnia, hypertension. In addition there is a medication called Xyrem which has been proven to be very effective in patients with narcolepsy with or without cataplexy. For cataplexy I suggest no new medication at this time, as your cataplexy is not frequent and not severe.   I will prescribe Adderall 20 mg twice a day, second dose no later than 4 PM. For your own safety while driving, I do not think you should drive for more than 30 minutes at a time. Please do not drive when you feel sleepy and take a break, if you start feeling sleepy. I will see you back in 3 months, but please call in the next couple of weeks to talk about starting Xyrem. Read and watch the information material provided.   Please remember to try to maintain good sleep hygiene, which means: Keep a regular sleep and wake schedule, try not to exercise or have a meal within 2 hours of your bedtime, try to keep your bedroom conducive for sleep, that is, cool and dark, without light distractors such as an illuminated alarm clock, and refrain from watching TV right before sleep or in the middle of the night and do not keep the TV or radio on during the night. Also, try not to use or play on electronic devices at bedtime, such as your cell phone, tablet  PC or laptop. If you like to read at bedtime on an electronic device, try to dim the background light as much as possible. Do not eat in the middle of the night.

## 2013-06-23 NOTE — Progress Notes (Signed)
Subjective:    Patient ID: Cynthia [name], female    DOB: [birth year], 42 y.o.   MRN: [id number]  HPI  42 year old white female, nonsmoker is in for a recheck of attention deficit disorder, anxiety, narcolepsy. She is currently under the care of the sleep clinic and has a provider prescribing Adderall to help narcolepsy. She is tolerating Cymbalta well and Klonopin as needed. Denies any feelings of helplessness, hopelessness, both of death or dying  Patient has concerns of left shoulder pain x1 day. Reports that she bent down to pick up her daughter and felt a pop in her left shoulder. She rates the pain amount of pain, worse with movement. Has not taken any medication over-the-counter. Describes it as a pulling ache.  Review of Systems  Constitutional: Negative.   Respiratory: Negative.   Cardiovascular: Negative.   Gastrointestinal: Negative.   Endocrine: Negative.   Musculoskeletal: Positive for myalgias.       Left shoulder pain  Skin: Negative.   Allergic/Immunologic: Negative.   Neurological: Negative.   Psychiatric/Behavioral: Negative.    Past Medical History  Diagnosis Date  . Anxiety and depression 04/03/2008  . Acne   . Hemorrhoids   . Basal cell cancer     History   Social History  . Marital Status: Married    Spouse Name: N/A    Number of Children: N/A  . 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 Smoking status: Former Smoker -- 1.00 packs/day for 10 years    Types: Cigarettes    Quit date: 08/15/2003  . Smokeless tobacco: Never Used  . Alcohol Use: Yes     Comment: rare  . Drug Use: No  . Sexual Activity: Not on file   Other Topics Concern  . Not on file   Social History Narrative  . No narrative on file    Past Surgical History  Procedure Laterality Date  . Partial hysterectomy    . Bunionectomy    . Cesarean section      x 2  . Skin cancer excision      Family History  Problem  Relation Age of Onset  . Prostate cancer Maternal Grandfather   . Kidney disease Sister   . Heart disease Paternal Grandfather   . Depression      Allergies  Allergen Reactions  . Adderall [Amphetamine-Dextroamphetamine]     pruritis  . Doxycycline Hyclate     REACTION: heartburn  . Meclizine Hcl     REACTION: urticaria (hives)  . Morphine   . Naproxen Sodium     REACTION: swelling,hives Per pt can now take with no reaction. May 2013    Current Outpatient Prescriptions on File Prior to Visit  Medication Sig Dispense Refill  . clonazePAM (KLONOPIN) 0.5 MG tablet Take 1 tablet (0.5 mg total) by mouth 2 (two) times daily as needed for anxiety.  60 tablet  1  . CYMBALTA 60 MG capsule TAKE TWO CAPSULES BY MOUTH AT BEDTIME  60 capsule  3  . tretinoin (RETIN-A) 0.05 % cream Apply 1 application topically at bedtime.       No current facility-administered medications on file prior to visit.    BP 98/60  Pulse 94  Wt 146 lb (66.225 kg)chart    Objective:   Physical Exam  Constitutional: She is oriented to person, place, and time. She appears well-developed and well-nourished.  HENT:  Right Ear: External  ear normal.  Left Ear: External ear normal.  Nose: Nose normal.  Mouth/Throat: Oropharynx is clear and moist.  Neck: Normal range of motion. Neck supple.  Cardiovascular: Normal rate, regular rhythm and normal heart sounds.   Pulmonary/Chest: Effort normal and breath sounds normal.  Abdominal: Soft. Bowel sounds are normal.  Musculoskeletal: She exhibits tenderness. She exhibits no edema.  Tenderness to palpation of the left sternocleidomastoid. Pain with abduction and abduction of the left arm. No tenderness to palpation of the rotator cuff. Radial pulses 2 out of 2  Neurological: She is alert and oriented to person, place, and time.  Skin: Skin is warm and dry.  Psychiatric: She has a normal mood and affect.          Assessment & Plan:  Assessment: 1. Anxiety 2.  Narcolepsy 3. Muscle strain, left shoulder  Plan: Baclofen 10 mg one tablet 3 times a day as needed for muscle pain. Ice to the affected area. Over-the-counter Aleve as needed. Patient on the opposite symptoms worsen or persist. Recheck as scheduled, and as needed.

## 2013-06-26 ENCOUNTER — Encounter: Payer: Self-pay | Admitting: Family Medicine

## 2013-06-26 ENCOUNTER — Telehealth: Payer: Self-pay | Admitting: Family

## 2013-06-26 ENCOUNTER — Ambulatory Visit (INDEPENDENT_AMBULATORY_CARE_PROVIDER_SITE_OTHER): Payer: 59 | Admitting: Family Medicine

## 2013-06-26 VITALS — BP 102/64 | HR 83 | Temp 97.9°F | Wt 146.0 lb

## 2013-06-26 DIAGNOSIS — M5412 Radiculopathy, cervical region: Secondary | ICD-10-CM

## 2013-06-26 MED ORDER — PREDNISONE 10 MG PO TABS: ORAL_TABLET | ORAL | Status: DC

## 2013-06-26 NOTE — Progress Notes (Signed)
Pre visit review using our clinic review tool, if applicable. No additional management support is needed unless otherwise documented below in the visit note. 

## 2013-06-26 NOTE — Telephone Encounter (Signed)
Patient Information:  Caller Name: Dymin  Phone: [phone]  Patient: Cynthia [name], Cynthia [name]  Gender: Female  DOB: [birth year]  Age: 42 Years  PCP: Adline Mango [facility])  Pregnant: No  Office Follow Up:  Does the office need to follow up with this patient?: No  Instructions For The Office: N/A  RN Note:  Patient requested latest possible appointment for today so she could get there in time. Advised to call office prior to appointment if she develops any new or worsening symptoms.  Symptoms  Reason For Call & Symptoms: Reports right shoulder going numb, thinks this may be a nerve issue. Falls asleep at times as well. At times is having severe pain.  Reviewed Health History In EMR: Yes  Reviewed Medications In EMR: Yes  Reviewed Allergies In EMR: Yes  Reviewed Surgeries / Procedures: Yes  Date of Onset of Symptoms: 06/12/2013  Treatments Tried: Muscle relaxer  Treatments Tried Worked: No OB / GYN:  LMP: Unknown  Guideline(s) Used:  Shoulder Pain  Disposition Per Guideline:   Go to Office Now  Reason For Disposition Reached:   Severe pain (e.g., excruciating, unable to do any normal activities)  Advice Given:  N/A  Patient Will Follow Care Advice:  YES  Appointment Scheduled:  06/26/2013 16:30:00 Appointment Scheduled Provider:  Evelena Peat (Family Practice)

## 2013-06-26 NOTE — Progress Notes (Signed)
  Subjective:    Patient ID: Cynthia [name], female    DOB: 09/24/1970, 42 y.o.   MRN: [id number]  HPI Right upper extremity pain, right neck pain, and intermittent numbness involving right hand and tingling sensation involving right upper extremity. This goes back several months and she was seen actually back in April of this year with similar symptoms. She was prescribed muscle relaxer which did not help. Symptoms have been somewhat intermittent since then. She is left-hand dominant. Her pain has particularly worsened over the past few days but she's had some symptoms off and on for several months. She had recent left shoulder pain and this pain is different.  She is aware of some pain base right cervical spine region. She's never had any cervical disc problems previously. She has taken occasional Tylenol or Advil without much relief. She's tried muscle relaxers without any improvement. She has some pain involving right index and thumb but numbness involving entire hand and her pain radiates all the way from her neck down to her hand. Sometimes triggered by certain movements of the hand or upper extremity. She's not aware of any associated weakness  Past Medical History  Diagnosis Date  . Anxiety and depression 04/03/2008  . Acne   . Hemorrhoids   . Basal cell cancer    Past Surgical History  Procedure Laterality Date  . Partial hysterectomy    . Bunionectomy    . Cesarean section      x 2  . Skin cancer excision      reports that she quit smoking about 9 years ago. Her smoking use included Cigarettes. She has a 10 pack-year smoking history. She has never used smokeless tobacco. She reports that she drinks alcohol. She reports that she does not use illicit drugs. family history includes Depression in an other family member; Heart disease in her paternal grandfather; Kidney disease in her sister; Prostate cancer in her maternal grandfather. Allergies  Allergen Reactions  . Adderall  [Amphetamine-Dextroamphetamine]     pruritis  . Doxycycline Hyclate     REACTION: heartburn  . Meclizine Hcl     REACTION: urticaria (hives)  . Morphine   . Naproxen Sodium     REACTION: swelling,hives Per pt can now take with no reaction. May 2013      Review of Systems  Constitutional: Negative for fever, chills, appetite change and unexpected weight change.  Respiratory: Negative for cough and shortness of breath.   Cardiovascular: Negative for chest pain.  Neurological: Positive for numbness. Negative for weakness.       Objective:   Physical Exam  Constitutional: She appears well-developed and well-nourished.  Neck: Neck supple. No thyromegaly present.  Cardiovascular: Normal rate and regular rhythm.   Pulmonary/Chest: Effort normal and breath sounds normal. No respiratory distress. She has no wheezes. She has no rales.  Musculoskeletal:  Patient has full range of motion right shoulder. No localized tenderness. No cervical tenderness. She does have some right paracervical muscle tenderness. Full range of motion cervical spine.  Lymphadenopathy:    She has no cervical adenopathy.  Neurological:  Symmetric upper extremity reflexes. No focal weakness. No sensory impairment at this time.          Assessment & Plan:  Probable right cervical radiculopathy. Her symptoms have been progressive over several months. Prednisone taper. Cervical spine films. Will likely need MRI to further assess.

## 2013-06-26 NOTE — Patient Instructions (Signed)
Cervical Radiculopathy  Cervical radiculopathy happens when a nerve in the neck is pinched or bruised by a slipped (herniated) disk or by arthritic changes in the bones of the cervical spine. This can occur due to an injury or as part of the normal aging process. Pressure on the cervical nerves can cause pain or numbness that runs from your neck all the way down into your arm and fingers.  CAUSES   There are many possible causes, including:  · Injury.  · Muscle tightness in the neck from overuse.  · Swollen, painful joints (arthritis).  · Breakdown or degeneration in the bones and joints of the spine (spondylosis) due to aging.  · Bone spurs that may develop near the cervical nerves.  SYMPTOMS   Symptoms include pain, weakness, or numbness in the affected arm and hand. Pain can be severe or irritating. Symptoms may be worse when extending or turning the neck.  DIAGNOSIS   Your caregiver will ask about your symptoms and do a physical exam. He or she may test your strength and reflexes. X-rays, CT scans, and MRI scans may be needed in cases of injury or if the symptoms do not go away after a period of time. Electromyography (EMG) or nerve conduction testing may be done to study how your nerves and muscles are working.  TREATMENT   Your caregiver may recommend certain exercises to help relieve your symptoms. Cervical radiculopathy can, and often does, get better with time and treatment. If your problems continue, treatment options may include:  · Wearing a soft collar for short periods of time.  · Physical therapy to strengthen the neck muscles.  · Medicines, such as nonsteroidal anti-inflammatory drugs (NSAIDs), oral corticosteroids, or spinal injections.  · Surgery. Different types of surgery may be done depending on the cause of your problems.  HOME CARE INSTRUCTIONS   · Put ice on the affected area.  · Put ice in a plastic bag.  · Place a towel between your skin and the bag.  · Leave the ice on for 15-20 minutes,  03-04 times a day or as directed by your caregiver.  · If ice does not help, you can try using heat. Take a warm shower or bath, or use a hot water bottle as directed by your caregiver.  · You may try a gentle neck and shoulder massage.  · Use a flat pillow when you sleep.  · Only take over-the-counter or prescription medicines for pain, discomfort, or fever as directed by your caregiver.  · If physical therapy was prescribed, follow your caregiver's directions.  · If a soft collar was prescribed, use it as directed.  SEEK IMMEDIATE MEDICAL CARE IF:   · Your pain gets much worse and cannot be controlled with medicines.  · You have weakness or numbness in your hand, arm, face, or leg.  · You have a high fever or a stiff, rigid neck.  · You lose bowel or bladder control (incontinence).  · You have trouble with walking, balance, or speaking.  MAKE SURE YOU:   · Understand these instructions.  · Will watch your condition.  · Will get help right away if you are not doing well or get worse.  Document Released: 04/25/2001 Document Revised: 10/23/2011 Document Reviewed: 03/14/2011  [employer]® Patient Information ©2014 [employer].

## 2013-06-27 ENCOUNTER — Ambulatory Visit (INDEPENDENT_AMBULATORY_CARE_PROVIDER_SITE_OTHER)
Admission: RE | Admit: 2013-06-27 | Discharge: 2013-06-27 | Disposition: A | Discharge status: Home / Self Care | Payer: 59 | Source: Ambulatory Visit | Attending: Family Medicine | Admitting: Family Medicine

## 2013-06-27 DIAGNOSIS — M5412 Radiculopathy, cervical region: Secondary | ICD-10-CM

## 2013-06-30 ENCOUNTER — Telehealth: Payer: Self-pay | Admitting: Pulmonary Disease

## 2013-06-30 ENCOUNTER — Telehealth: Payer: Self-pay | Admitting: Neurology

## 2013-06-30 ENCOUNTER — Telehealth: Payer: Self-pay | Admitting: Family Medicine

## 2013-06-30 NOTE — Telephone Encounter (Signed)
Pt would like results from her x-ray please give her a call. Thank you

## 2013-06-30 NOTE — Telephone Encounter (Signed)
Cervical spine films were normal.

## 2013-06-30 NOTE — Telephone Encounter (Signed)
Spoke with patient-states that [facility] sent pt to Neurology due to narcolepsy and patient is not agreeing with this. 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 Would like another opinion from another Neuro MD. Pt aware she may not get a call back today. KC please advise.  Thanks.

## 2013-07-01 ENCOUNTER — Other Ambulatory Visit: Payer: Self-pay | Admitting: Family

## 2013-07-01 NOTE — Telephone Encounter (Signed)
Pt informed

## 2013-07-01 NOTE — Telephone Encounter (Signed)
She can be referred to sleep clinic at [facility] hospital.  See if they can see her asap.

## 2013-07-02 NOTE — Telephone Encounter (Signed)
lmomtcb x1 for pt 

## 2013-07-04 ENCOUNTER — Institutional Professional Consult (permissible substitution): Payer: Self-pay | Admitting: Neurology

## 2013-07-04 NOTE — Telephone Encounter (Signed)
LMTCBx2. [name], CMA  

## 2013-07-07 ENCOUNTER — Telehealth: Payer: Self-pay | Admitting: Neurology

## 2013-07-07 NOTE — Telephone Encounter (Signed)
Please advise 

## 2013-07-07 NOTE — Telephone Encounter (Signed)
LMTCBx3. Shona Pardo, CMA  

## 2013-07-08 NOTE — Telephone Encounter (Signed)
All info was sent to the pharmacy by Vikki Ports.  It is still in the verification process.  They will contact patient once this is complete.  The process is quite extensive for this specialty med.  I called the patient to advise.  Got no answer.  Left message.

## 2013-07-08 NOTE — Telephone Encounter (Signed)
Per protocol I will sign off on message and await call back. [name], CMA  

## 2013-07-11 ENCOUNTER — Emergency Department (HOSPITAL_BASED_OUTPATIENT_CLINIC_OR_DEPARTMENT_OTHER)
Admission: EM | Admit: 2013-07-11 | Discharge: 2013-07-11 | Disposition: A | Discharge status: Home / Self Care | Payer: 59 | Attending: Emergency Medicine | Admitting: Emergency Medicine

## 2013-07-11 ENCOUNTER — Encounter (HOSPITAL_BASED_OUTPATIENT_CLINIC_OR_DEPARTMENT_OTHER): Payer: Self-pay | Admitting: Emergency Medicine

## 2013-07-11 DIAGNOSIS — Z79899 Other long term (current) drug therapy: Secondary | ICD-10-CM | POA: Insufficient documentation

## 2013-07-11 DIAGNOSIS — Z23 Encounter for immunization: Secondary | ICD-10-CM | POA: Insufficient documentation

## 2013-07-11 DIAGNOSIS — Z872 Personal history of diseases of the skin and subcutaneous tissue: Secondary | ICD-10-CM | POA: Insufficient documentation

## 2013-07-11 DIAGNOSIS — Z85828 Personal history of other malignant neoplasm of skin: Secondary | ICD-10-CM | POA: Insufficient documentation

## 2013-07-11 DIAGNOSIS — Z8679 Personal history of other diseases of the circulatory system: Secondary | ICD-10-CM | POA: Insufficient documentation

## 2013-07-11 DIAGNOSIS — Z87891 Personal history of nicotine dependence: Secondary | ICD-10-CM | POA: Insufficient documentation

## 2013-07-11 DIAGNOSIS — S61219A Laceration without foreign body of unspecified finger without damage to nail, initial encounter: Secondary | ICD-10-CM

## 2013-07-11 DIAGNOSIS — S61209A Unspecified open wound of unspecified finger without damage to nail, initial encounter: Secondary | ICD-10-CM | POA: Insufficient documentation

## 2013-07-11 DIAGNOSIS — Y929 Unspecified place or not applicable: Secondary | ICD-10-CM | POA: Insufficient documentation

## 2013-07-11 DIAGNOSIS — F341 Dysthymic disorder: Secondary | ICD-10-CM | POA: Insufficient documentation

## 2013-07-11 DIAGNOSIS — W268XXA Contact with other sharp object(s), not elsewhere classified, initial encounter: Secondary | ICD-10-CM | POA: Insufficient documentation

## 2013-07-11 DIAGNOSIS — Y9389 Activity, other specified: Secondary | ICD-10-CM | POA: Insufficient documentation

## 2013-07-11 MED ORDER — TETANUS-DIPHTHERIA TOXOIDS TD 5-2 LFU IM INJ
0.5000 mL | INJECTION | Freq: Once | INTRAMUSCULAR | Status: DC
  Filled 2013-07-11: qty 0.5

## 2013-07-11 MED ORDER — TETANUS-DIPHTH-ACELL PERTUSSIS 5-2.5-18.5 LF-MCG/0.5 IM SUSP
0.5000 mL | Freq: Once | INTRAMUSCULAR | Status: AC
  Administered 2013-07-11: 0.5 mL via INTRAMUSCULAR

## 2013-07-11 MED ORDER — HYDROCODONE-ACETAMINOPHEN 5-325 MG PO TABS
2.0000 | ORAL_TABLET | Freq: Once | ORAL | Status: AC
  Administered 2013-07-11: 2 via ORAL

## 2013-07-11 MED ORDER — HYDROCODONE-ACETAMINOPHEN 5-325 MG PO TABS
ORAL_TABLET | ORAL | Status: AC
  Filled 2013-07-11: qty 2

## 2013-07-11 MED ORDER — GELATIN ABSORBABLE 12-7 MM EX MISC
1.0000 | Freq: Once | CUTANEOUS | Status: AC
  Administered 2013-07-11: 1 via TOPICAL

## 2013-07-11 MED ORDER — HYDROCODONE-ACETAMINOPHEN 5-325 MG PO TABS: 2.0000 | ORAL_TABLET | ORAL | Status: DC | PRN

## 2013-07-11 MED ORDER — GELATIN ABSORBABLE 12-7 MM EX MISC
CUTANEOUS | Status: AC
  Filled 2013-07-11: qty 1

## 2013-07-11 NOTE — ED Provider Notes (Signed)
Medical screening examination/treatment/procedure(s) were performed by non-physician practitioner and as supervising physician I was immediately available for consultation/collaboration.  EKG Interpretation   None         Dagmar Hait, MD 07/11/13 2311

## 2013-07-11 NOTE — ED Provider Notes (Signed)
CSN: [id number]     Arrival date & time 07/11/13  1805 History   First MD Initiated Contact with Patient 07/11/13 1910     Chief Complaint  Patient presents with  . Extremity Laceration   (Consider location/radiation/quality/duration/timing/severity/associated sxs/prior Treatment) Patient is a 42 y.o. female presenting with skin laceration. The history is provided by the patient. No language interpreter was used.  Laceration Location:  Finger Finger laceration location:  L middle finger Length (cm):  0.3 Depth:  Through dermis Quality: avulsion   Bleeding: uncontrolled   Laceration mechanism:  Unable to specify Pain details:    Quality:  Aching   Severity:  Mild   Timing:  Constant Foreign body present:  No foreign bodies Worsened by:  Nothing tried Tetanus status:  Out of date Pt complains of pain to left 3rd finger after cutting with a ceramic bank  Past Medical History  Diagnosis Date  . Anxiety and depression 04/03/2008  . Acne   . Hemorrhoids   . Basal cell cancer    Past Surgical History  Procedure Laterality Date  . Partial hysterectomy    . Bunionectomy    . Cesarean section      x 2  . Skin cancer excision     Family History  Problem Relation Age of Onset  . Prostate cancer Maternal Grandfather   . Kidney disease Sister   . Heart disease Paternal Grandfather   . Depression     History  Substance Use Topics  . Smoking status: Former Smoker -- 1.00 packs/day for 10 years    Types: Cigarettes    Quit date: 08/15/2003  . Smokeless tobacco: Never Used  . Alcohol Use: Yes     Comment: rare   OB History   Grav Para Term Preterm Abortions TAB SAB Ect Mult Living                 Review of Systems  Skin: Positive for wound.  All other systems reviewed and are negative.    Allergies  Doxycycline hyclate; Meclizine hcl; Morphine; and Naproxen sodium  Home Medications   Current Outpatient Rx  Name  Route  Sig  Dispense  Refill  .  amphetamine-dextroamphetamine (ADDERALL) 20 MG tablet   Oral   Take 1 tablet (20 mg total) by mouth 2 (two) times daily.   60 tablet   0   . baclofen (LIORESAL) 10 MG tablet   Oral   Take 1 tablet (10 mg total) by mouth 3 (three) times daily.   30 each   0   . clonazePAM (KLONOPIN) 0.5 MG tablet   Oral   Take 1 tablet (0.5 mg total) by mouth 2 (two) times daily as needed for anxiety.   60 tablet   1   . CYMBALTA 60 MG capsule      TAKE TWO CAPSULES BY MOUTH AT BEDTIME   60 capsule   3   . predniSONE (DELTASONE) 10 MG tablet      Taper as follows:  4-4-4-4-3-3-2-2-1   27 tablet   0   . tretinoin (RETIN-A) 0.05 % cream   Topical   Apply 1 application topically at bedtime.          BP 108/66  Pulse 75  Temp(Src) 98.6 F (37 C) (Oral)  Resp 18  Ht 5' 1.75" (1.568 m)  Wt 135 lb (61.236 kg)  BMI 24.91 kg/m2  SpO2 100% Physical Exam  Nursing note and vitals reviewed. Constitutional: She appears  well-developed and well-nourished.  HENT:  Head: Normocephalic.  Musculoskeletal: She exhibits tenderness.  Neurological: She is alert.  Skin: Skin is warm.  3mm laceration finger  Skin avulsed nv and ns intact  Psychiatric: She has a normal mood and affect.    ED Course  Procedures (including critical care time) Labs Review Labs Reviewed - No data to display Imaging Review No results found.  EKG Interpretation   None       MDM   1. Laceration of finger, left, initial encounter    Hydrocodone 2 here, tetanus,  Gelfoam dressing.       Lonia Skinner [place], PA-C 07/11/13 1929

## 2013-07-11 NOTE — ED Notes (Signed)
Left middle finger lac-cut of ceramic piggy bank approx 1 hour PTA

## 2013-07-14 ENCOUNTER — Ambulatory Visit: Payer: 59 | Admitting: Neurology

## 2013-07-16 ENCOUNTER — Encounter: Payer: Self-pay | Admitting: *Deleted

## 2013-07-21 ENCOUNTER — Telehealth: Payer: Self-pay | Admitting: Neurology

## 2013-07-21 NOTE — Telephone Encounter (Signed)
Please advise 

## 2013-07-21 NOTE — Telephone Encounter (Signed)
Please ask patient to make a followup appointment first. She missed her appointment in December.

## 2013-07-22 ENCOUNTER — Telehealth: Payer: Self-pay | Admitting: *Deleted

## 2013-07-22 DIAGNOSIS — G47411 Narcolepsy with cataplexy: Secondary | ICD-10-CM

## 2013-07-22 DIAGNOSIS — F4323 Adjustment disorder with mixed anxiety and depressed mood: Secondary | ICD-10-CM

## 2013-07-22 MED ORDER — AMPHETAMINE-DEXTROAMPHETAMINE 20 MG PO TABS: 20.0000 mg | ORAL_TABLET | Freq: Two times a day (BID) | ORAL | Status: DC

## 2013-07-22 NOTE — Telephone Encounter (Signed)
Rx for Adderall at current dose done. Please call pt for pick up of Rx.

## 2013-07-22 NOTE — Telephone Encounter (Signed)
Informed patient of note per Dr Frances Furbish

## 2013-07-22 NOTE — Telephone Encounter (Signed)
Called patient to schedule sooner appt for medication management,lt VM message

## 2013-07-22 NOTE — Telephone Encounter (Signed)
Valerie sent me a message saying The patient now has an appt scheduled.  Appt made for March 2015.  Would you like to wait on dose change until she is seen?  Please advise.  Thank you.

## 2013-07-22 NOTE — Telephone Encounter (Signed)
Please make sooner appointment and we will discuss medication management at the time.

## 2013-07-31 ENCOUNTER — Encounter: Payer: Self-pay | Admitting: Nurse Practitioner

## 2013-07-31 ENCOUNTER — Ambulatory Visit (INDEPENDENT_AMBULATORY_CARE_PROVIDER_SITE_OTHER): Payer: 59 | Admitting: Nurse Practitioner

## 2013-07-31 ENCOUNTER — Encounter: Payer: Self-pay | Admitting: *Deleted

## 2013-07-31 ENCOUNTER — Telehealth: Payer: Self-pay | Admitting: Family

## 2013-07-31 VITALS — BP 90/58 | HR 104 | Temp 101.0°F | Ht 61.16 in | Wt 143.0 lb

## 2013-07-31 DIAGNOSIS — J111 Influenza due to unidentified influenza virus with other respiratory manifestations: Secondary | ICD-10-CM

## 2013-07-31 DIAGNOSIS — R509 Fever, unspecified: Secondary | ICD-10-CM

## 2013-07-31 MED ORDER — BENZONATATE 100 MG PO CAPS: ORAL_CAPSULE | ORAL | Status: DC

## 2013-07-31 MED ORDER — IBUPROFEN 200 MG PO CAPS: 400.0000 mg | ORAL_CAPSULE | Freq: Once | ORAL | Status: DC

## 2013-07-31 MED ORDER — OSELTAMIVIR PHOSPHATE 75 MG PO CAPS: 75.0000 mg | ORAL_CAPSULE | Freq: Two times a day (BID) | ORAL | Status: DC

## 2013-07-31 NOTE — Patient Instructions (Signed)
You likely have flu. The average duration is 5-10 days. Treatment is largely symptom management, however you may start tamiflu as it can shorten the duration by 1-2 days. For sinus congestion, start daily sinus rinses (neilmed Sinus Rinse) & 30 mg to 60 mg pseudoephedrine twice daily. For sore throat use benzocaine throat lozenges or spray. For aches & fever alternate tylenol & ibuprophen every 4-6 hours. For cough, you may use a spoonful of honey thinned with lemon juice or hot tea, or benzonatate capsules as prescribed. Sip fluids every hour. Rest. If you are not feeling better in 1 week or develop fever or chest pain, call us for re-evaluation. Feel better!    Influenza A (H1N1) H1N1 formerly called "swine flu" is a new influenza virus causing sickness in people. The H1N1 virus is different from seasonal influenza viruses. However, the H1N1 symptoms are similar to seasonal influenza and it is spread from person to person. You may be at higher risk for serious problems if you have underlying serious medical conditions. The CDC and the [employer] are following reported cases around the world. CAUSES   The flu is thought to spread mainly person-to-person through coughing or sneezing of infected people.  A person may become infected by touching something with the virus on it and then touching their mouth or nose. SYMPTOMS   Fever.  Headache.  Tiredness.  Cough.  Sore throat.  Runny or stuffy nose.  Body aches.  Diarrhea and vomiting These symptoms are referred to as "flu-like symptoms." A lot of different illnesses, including the common cold, may have similar symptoms. DIAGNOSIS   There are tests that can tell if you have the H1N1 virus.  Confirmed cases of H1N1 will be reported to the state or local health department.  A doctor's exam may be needed to tell whether you have an infection that is a complication of the flu. HOME CARE INSTRUCTIONS   Stay informed.  Visit the [facility] website for current recommendations. Visit [employer]. You may also call 1-800-CDC-INFO (705-638-5298).  Get help early if you develop any of the above symptoms.  If you are at high risk from complications of the flu, talk to your caregiver as soon as you develop flu-like symptoms. Those at higher risk for complications include:  People 65 years or older.  People with chronic medical conditions.  Pregnant women.  Young children.  Your caregiver may recommend antiviral medicine to help treat the flu.  If you get the flu, get plenty of rest, drink enough water and fluids to keep your urine clear or pale yellow, and avoid using alcohol or tobacco.  You may take over-the-counter medicine to relieve the symptoms of the flu if your caregiver approves. (Never give aspirin to children or teenagers who have flu-like symptoms, particularly fever). TREATMENT  If you do get sick, antiviral drugs are available. These drugs can make your illness milder and make you feel better faster. Treatment should start soon after illness starts. It is only effective if taken within the first day of becoming ill. Only your caregiver can prescribe antiviral medication.  PREVENTION   Cover your nose and mouth with a tissue or your arm when you cough or sneeze. Throw the tissue away.  Wash your hands often with soap and warm water, especially after you cough or sneeze. Alcohol-based cleaners are also effective against germs.  Avoid touching your eyes, nose or mouth. This is one way germs spread.  Try to avoid contact with sick  people. Follow public health advice regarding school closures. Avoid crowds.  Stay home if you get sick. Limit contact with others to keep from infecting them. People infected with the H1N1 virus may be able to infect others anywhere from 1 day before feeling sick to 5-7 days after getting flu symptoms.  An H1N1 vaccine is available to help protect against the  virus. In addition to the H1N1 vaccine, you will need to be vaccinated for seasonal influenza. The H1N1 and seasonal vaccines may be given on the same day. The CDC especially recommends the H1N1 vaccine for:  Pregnant women.  People who live with or care for children younger than 60 months of age.  Health care and emergency services personnel.  Persons between the ages of 24 months through 68 years of age.  People from ages 49 through 24 years who are at higher risk for H1N1 because of chronic health disorders or immune system problems. FACEMASKS In community and home settings, the use of facemasks and N95 respirators are not normally recommended. In certain circumstances, a facemask or N95 respirator may be used for persons at increased risk of severe illness from influenza. Your caregiver can give additional recommendations for facemask use. IN CHILDREN, EMERGENCY WARNING SIGNS THAT NEED URGENT MEDICAL CARE:  Fast breathing or trouble breathing.  Bluish skin color.  Not drinking enough fluids.  Not waking up or not interacting normally.  Being so fussy that the child does not want to be held.  Your child has an oral temperature above 102 F (38.9 C), not controlled by medicine.  Your baby is older than 3 months with a rectal temperature of 102 F (38.9 C) or higher.  Your baby is 72 months old or younger with a rectal temperature of 100.4 F (38 C) or higher.  Flu-like symptoms improve but then return with fever and worse cough. IN ADULTS, EMERGENCY WARNING SIGNS THAT NEED URGENT MEDICAL CARE:  Difficulty breathing or shortness of breath.  Pain or pressure in the chest or abdomen.  Sudden dizziness.  Confusion.  Severe or persistent vomiting.  Bluish color.  You have a oral temperature above 102 F (38.9 C), not controlled by medicine.  Flu-like symptoms improve but return with fever and worse cough. SEEK IMMEDIATE MEDICAL CARE IF:  You or someone you know is  experiencing any of the above symptoms. When you arrive at the emergency center, report that you think you have the flu. You may be asked to wear a mask and/or sit in a secluded area to protect others from getting sick. MAKE SURE YOU:   Understand these instructions.  Will watch your condition.  Will get help right away if you are not doing well or get worse. Some of this information courtesy of the CDC.  Document Released: 01/17/2008 Document Revised: 10/23/2011 Document Reviewed: 01/17/2008 [facility] Patient Information 2014 [employer], [place].

## 2013-07-31 NOTE — Telephone Encounter (Signed)
Patient Information:  Caller Name: Leonette Most  Phone: [phone]  Patient: Cynthia [name], Cynthia [name]  Gender: Female  DOB: 03-27-1971  Age: 42 Years  PCP: [facility])  Pregnant: No  Office Follow Up:  Does the office need to follow up with this patient?: No  Instructions For The Office: N/A  RN Note:  Suandrea scheduled appointment with Libby Maw, NP at 16:00 in the [facility] office.  Symptoms  Reason For Call & Symptoms: Onset 07/28/2013 of cough, SOB, and fever.  Also c/o generalized weakness.  Reviewed Health History In EMR: Yes  Reviewed Medications In EMR: Yes  Reviewed Allergies In EMR: Yes  Reviewed Surgeries / Procedures: Yes  Date of Onset of Symptoms: 07/28/2013  Treatments Tried: Mucinex and Sudafed  Treatments Tried Worked: No OB / GYN:  LMP: Unknown  Guideline(s) Used:  Cough  Disposition Per Guideline:   Go to Office Now  Reason For Disposition Reached:   Wheezing is present  Advice Given:  Call Back If:  Difficulty breathing  You become worse.  Patient Will Follow Care Advice:  YES  Appointment Scheduled:  07/31/2013 16:00:00 Appointment Scheduled Provider:  N/A

## 2013-07-31 NOTE — Progress Notes (Signed)
   Subjective:    Patient ID: Cynthia [name], female    DOB: [birth year], 42 y.o.   MRN: [id number]  Fever  This is a new problem. The current episode started in the past 7 days (3da). The problem occurs constantly. The problem has been unchanged. Her temperature was unmeasured prior to arrival. Associated symptoms include chest pain ("feels like fire"), congestion, coughing, ear pain, headaches, muscle aches and a sore throat. Pertinent negatives include no abdominal pain, diarrhea, nausea, rash, vomiting or wheezing. Treatments tried: mucinex. The treatment provided no relief.      Review of Systems  Constitutional: Positive for fever and fatigue.  HENT: Positive for congestion, ear pain, postnasal drip, sinus pressure and sore throat.   Respiratory: Positive for cough. Negative for chest tightness, shortness of breath and wheezing.   Cardiovascular: Positive for chest pain ("feels like fire").  Gastrointestinal: Negative for nausea, vomiting, abdominal pain and diarrhea.  Musculoskeletal: Negative for back pain.  Skin: Negative for rash.  Neurological: Positive for headaches.  Hematological: Negative for adenopathy.       Objective:   Physical Exam  Vitals reviewed. Constitutional: She is oriented to person, place, and time. She appears well-developed and well-nourished. No distress.  Looks tired  HENT:  Head: Normocephalic and atraumatic.  Right Ear: External ear normal.  Left Ear: External ear normal.  Mouth/Throat: No oropharyngeal exudate.  Eyes: Conjunctivae are normal. Pupils are equal, round, and reactive to light. Right eye exhibits no discharge. Left eye exhibits no discharge.  Neck: Normal range of motion. Neck supple. No thyromegaly present.  Cardiovascular: Normal rate, regular rhythm and normal heart sounds.   Pulmonary/Chest: Effort normal and breath sounds normal. No respiratory distress. She has no wheezes.  Lymphadenopathy:    She has no cervical adenopathy.    Neurological: She is alert and oriented to person, place, and time.  Skin: Skin is warm and dry.  Psychiatric: She has a normal mood and affect. Her behavior is normal. Thought content normal.          Assessment & Plan:  1. Influenza Fever, cough, fatigue, myalgia - benzonatate (TESSALON) 100 MG capsule; Take 1-2 capsules po up to 3 times daily PRN cough  Dispense: 60 capsule; Refill: 0 - oseltamivir (TAMIFLU) 75 MG capsule; Take 1 capsule (75 mg total) by mouth 2 (two) times daily.  Dispense: 10 capsule; Refill: 0  2. Fever, unspecified  - Ibuprofen CAPS 400 mg; Take 2 capsules (400 mg total) by mouth once. Administered in ofc.

## 2013-07-31 NOTE — Progress Notes (Signed)
Pre-visit discussion using our clinic review tool. No additional management support is needed unless otherwise documented below in the visit note.  

## 2013-08-06 ENCOUNTER — Ambulatory Visit (INDEPENDENT_AMBULATORY_CARE_PROVIDER_SITE_OTHER): Payer: 59 | Admitting: Neurology

## 2013-08-06 ENCOUNTER — Encounter: Payer: Self-pay | Admitting: Neurology

## 2013-08-06 VITALS — BP 96/63 | HR 80 | Ht 61.75 in | Wt 143.0 lb

## 2013-08-06 DIAGNOSIS — G47411 Narcolepsy with cataplexy: Secondary | ICD-10-CM

## 2013-08-06 DIAGNOSIS — F411 Generalized anxiety disorder: Secondary | ICD-10-CM

## 2013-08-06 DIAGNOSIS — F329 Major depressive disorder, single episode, unspecified: Secondary | ICD-10-CM

## 2013-08-06 MED ORDER — AMPHETAMINE-DEXTROAMPHETAMINE 30 MG PO TABS: 30.0000 mg | ORAL_TABLET | Freq: Two times a day (BID) | ORAL | Status: DC
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
## 2013-08-06 NOTE — Patient Instructions (Addendum)
Please restart your Cymbalta at 60 mg once daily in the morning and after 2 weeks go back to the full dose of 120 mg once daily in the morning. We will increase your Adderall to 30 mg twice daily. We are awaiting your Xyrem to be started. I think, it will be good for you to start seeing a psychiatrist for depression, anxiety and skin picking.

## 2013-08-08 ENCOUNTER — Telehealth: Payer: Self-pay | Admitting: Neurology

## 2013-08-08 NOTE — Telephone Encounter (Signed)
Christy from [employer] was calling to ask if it is okay for patient to take Xyrim with Clonazepam since it is contraindicated. Please call them back at the number listed and press option [phone] to speak to a pharmacist.

## 2013-08-08 NOTE — Telephone Encounter (Signed)
I called the pharmacist. The patient apparently is on clonazepam in low dose of 0.5 mg twice daily if needed. It is probably okay for the patient takes the clonazepam during the day, not okay to take it at nighttime with the Xyrem. If the patient is aware of this, I would okay to continue the clonazepam if needed during the daytime only.

## 2013-09-04 ENCOUNTER — Other Ambulatory Visit: Payer: Self-pay | Admitting: Neurology

## 2013-09-04 DIAGNOSIS — G47411 Narcolepsy with cataplexy: Secondary | ICD-10-CM

## 2013-09-04 MED ORDER — AMPHETAMINE-DEXTROAMPHETAMINE 30 MG PO TABS: 30.0000 mg | ORAL_TABLET | Freq: Two times a day (BID) | ORAL | Status: DC

## 2013-09-04 NOTE — Telephone Encounter (Signed)
Needs refill on her Adderall. Her last pill is Tuesday morning.

## 2013-09-05 NOTE — Telephone Encounter (Signed)
Called patient and left message concerning her Rx being ready to be picked up at the front desk. I advised the patient that if she has any other problems, questions or concerns to call the office.

## 2013-09-08 ENCOUNTER — Other Ambulatory Visit: Payer: Self-pay | Admitting: Family

## 2013-09-08 NOTE — Telephone Encounter (Signed)
Do I need to contact pt for UDS before refill is sent in?

## 2013-10-01 ENCOUNTER — Encounter: Payer: Self-pay | Admitting: Family

## 2013-10-01 ENCOUNTER — Ambulatory Visit (INDEPENDENT_AMBULATORY_CARE_PROVIDER_SITE_OTHER): Payer: 59 | Admitting: Family

## 2013-10-01 VITALS — BP 96/60 | HR 106 | Wt 140.0 lb

## 2013-10-01 DIAGNOSIS — F411 Generalized anxiety disorder: Secondary | ICD-10-CM

## 2013-10-01 DIAGNOSIS — F988 Other specified behavioral and emotional disorders with onset usually occurring in childhood and adolescence: Secondary | ICD-10-CM

## 2013-10-01 MED ORDER — VENLAFAXINE HCL ER 75 MG PO CP24: 75.0000 mg | ORAL_CAPSULE | Freq: Every day | ORAL | Status: DC

## 2013-10-01 NOTE — Patient Instructions (Signed)

## 2013-10-01 NOTE — Progress Notes (Signed)
Pre visit review using our clinic review tool, if applicable. No additional management support is needed unless otherwise documented below in the visit note. 

## 2013-10-01 NOTE — Progress Notes (Signed)
Subjective:    Patient ID: Cynthia [name], female    DOB: 1971-01-23, 43 y.o.   MRN: [id number]  HPI 44 year old white female, nonsmoker, with a history of anxiety and Narcolepsy is in today with concerns of picking of her skin particularly when she is anxious. Reports these episodes are occurring more frequently and now has sores on her chest, arms, buttocks, and legs. Takes Adderall for narcolepsy he has been the only thing that's been effective for her sleep disorder.  She had been on Cymbalta for anxiety but takes it infrequently. Is requesting to try a different medication. Is concerned about the possibility of weight gain. Is concerned about the possibility of weight gain.    Review of Systems  Constitutional: Negative.   Respiratory: Negative.   Cardiovascular: Negative.   Skin: Positive for wound.       Multiple sores on chest, arms, neck and buttocks related to picking skin  Neurological: Negative.   Psychiatric/Behavioral: Positive for agitation. The patient is nervous/anxious.        Anxiety causing her to pick her skin   Past Medical History  Diagnosis Date  . Anxiety and depression 04/03/2008  . Acne   . Hemorrhoids   . Basal cell cancer     History   Social History  . Marital Status: Married    Spouse Name: Cynthia [name]    Number of Children: 2  . 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 Smoking status: Former Smoker -- 1.00 packs/day for 10 years    Types: Cigarettes    Quit date: 08/15/2003  . Smokeless tobacco: Never Used  . Alcohol Use: No     Comment: rare  . Drug Use: No  . Sexual Activity: Not on file   Other Topics Concern  . Not on file   Social History Narrative   Pt lives at home with husband Cynthia [name]) and two children   Pt is right handed    Education - Bachelors degree   Caffeine 1-2 cups a day    Past Surgical History  Procedure Laterality Date  . Partial hysterectomy     . Bunionectomy    . Cesarean section      x 2  . Skin cancer excision      Family History  Problem Relation Age of Onset  . Prostate cancer Maternal Grandfather   . Kidney disease Sister   . Heart disease Paternal Grandfather   . Depression      Allergies  Allergen Reactions  . Doxycycline Hyclate     REACTION: heartburn  . Meclizine Hcl     REACTION: urticaria (hives)  . Morphine     Current Outpatient Prescriptions on File Prior to Visit  Medication Sig Dispense Refill  . amphetamine-dextroamphetamine (ADDERALL) 30 MG tablet Take 1 tablet (30 mg total) by mouth 2 (two) times daily. Take second dose no later than 3 PM  60 tablet  0  . benzonatate (TESSALON) 100 MG capsule Take 1-2 capsules po up to 3 times daily PRN cough  60 capsule  0  . clonazePAM (KLONOPIN) 0.5 MG tablet TAKE ONE TABLET BY MOUTH TWICE DAILY AS NEEDED FOR ANXIETY  60 tablet  0  . tretinoin (RETIN-A) 0.05 % cream Apply 1 application topically at bedtime.      Marland Kitchen oseltamivir (TAMIFLU) 75 MG capsule Take 1 capsule (75 mg total) by mouth  2 (two) times daily.  10 capsule  0   No current facility-administered medications on file prior to visit.    BP 96/60  Pulse 106  Wt 140 lb (63.504 kg)chart     Objective:   Physical Exam  Constitutional: She is oriented to person, place, and time. She appears well-developed and well-nourished.  Neck: Normal range of motion. Neck supple. No thyromegaly present.  Cardiovascular: Normal rate, regular rhythm and normal heart sounds.   Pulmonary/Chest: Effort normal and breath sounds normal.  Abdominal: Soft.  Neurological: She is alert and oriented to person, place, and time.  Skin: Skin is warm and dry.  Multiple sores noted to the chest, abdomen, arms, buttocks that are healing well. No drainage or discharge.   Psychiatric: She has a normal mood and affect.          Assessment & Plan:  Cynthia [name] was seen today for medication problem.  Diagnoses and  associated orders for this visit:  ADD (attention deficit disorder)  Generalized anxiety disorder  Other Orders - venlafaxine XR (EFFEXOR XR) 75 MG 24 hr capsule; Take 1 capsule (75 mg total) by mouth daily with breakfast.   Continue current meds. Call the office with any questions or concerns. Recheck in 3 weeks.

## 2013-10-06 ENCOUNTER — Other Ambulatory Visit: Payer: Self-pay | Admitting: Neurology

## 2013-10-06 DIAGNOSIS — G47411 Narcolepsy with cataplexy: Secondary | ICD-10-CM

## 2013-10-06 MED ORDER — AMPHETAMINE-DEXTROAMPHETAMINE 30 MG PO TABS: 30.0000 mg | ORAL_TABLET | Freq: Two times a day (BID) | ORAL | Status: DC

## 2013-10-06 NOTE — Telephone Encounter (Signed)
Pt called to request refill on her amphetamine-dextroamphetamine (ADDERALL) 30 MG tablet please call pt when ready for pick up.

## 2013-10-06 NOTE — Telephone Encounter (Signed)
Called patient to inform her that her Rx was ready to be picked up at the front desk and if she has any other problems, questions or concerns to call the office. Patient verbalized understanding. °

## 2013-10-11 ENCOUNTER — Other Ambulatory Visit: Payer: Self-pay | Admitting: Family

## 2013-10-21 ENCOUNTER — Encounter: Payer: Self-pay | Admitting: *Deleted

## 2013-10-22 ENCOUNTER — Ambulatory Visit: Payer: Self-pay | Admitting: Family

## 2013-10-22 ENCOUNTER — Telehealth: Payer: Self-pay | Admitting: Family

## 2013-10-22 MED ORDER — VENLAFAXINE HCL ER 150 MG PO CP24: 150.0000 mg | ORAL_CAPSULE | Freq: Every day | ORAL | Status: DC

## 2013-10-22 NOTE — Telephone Encounter (Signed)
Left a message for return call.  

## 2013-10-22 NOTE — Telephone Encounter (Signed)
Pt had to cancel appt today due to a meeting, but wants you to know she is not better, still picking. Pt still taking the med. Pt will resc.

## 2013-10-22 NOTE — Addendum Note (Signed)
Addended byAdline Mango: CAMPBELL, Cedra Villalon B on: 10/22/2013 12:29 PM   Modules accepted: Orders

## 2013-10-22 NOTE — Telephone Encounter (Signed)
Increase to 2 tabs daily. Recheck 3 weeks.

## 2013-10-22 NOTE — Telephone Encounter (Signed)
FYI

## 2013-10-23 NOTE — Telephone Encounter (Signed)
Left message on personally identified voicemail to advise pt of note 

## 2013-11-07 ENCOUNTER — Ambulatory Visit: Payer: 59 | Admitting: Neurology

## 2013-11-07 ENCOUNTER — Telehealth: Payer: Self-pay | Admitting: Neurology

## 2013-11-07 DIAGNOSIS — G47411 Narcolepsy with cataplexy: Secondary | ICD-10-CM

## 2013-11-07 NOTE — Telephone Encounter (Signed)
Patient missed her appt today.  She is out of Adderall and requesting a new Rx ASAP.  Would you like to refill or does she need to be seen first?  Please advise.  Thanks you.

## 2013-11-07 NOTE — Telephone Encounter (Signed)
I called the patient back.  Mailbox was ful, unable to leave message.

## 2013-11-07 NOTE — Telephone Encounter (Signed)
I will cover Rx till next appt, but she has to come for that appointment. Pls let pt know. Okay to refill.

## 2013-11-10 MED ORDER — AMPHETAMINE-DEXTROAMPHETAMINE 30 MG PO TABS: 30.0000 mg | ORAL_TABLET | Freq: Two times a day (BID) | ORAL | Status: DC

## 2013-11-10 NOTE — Telephone Encounter (Signed)
Okay to pick up prescription. Please remind patient to reschedule appointment.

## 2013-11-11 ENCOUNTER — Other Ambulatory Visit: Payer: Self-pay | Admitting: Neurology

## 2013-11-11 NOTE — Telephone Encounter (Signed)
Called pt and left message informing her that her Rx was ready to be picked up at the front desk and if she has any other problems, questions or concerns to call the office.  °

## 2013-11-28 ENCOUNTER — Encounter: Payer: Self-pay | Admitting: Family

## 2013-11-28 ENCOUNTER — Ambulatory Visit (INDEPENDENT_AMBULATORY_CARE_PROVIDER_SITE_OTHER): Payer: 59 | Admitting: Family

## 2013-11-28 VITALS — BP 92/60 | HR 88 | Wt 141.0 lb

## 2013-11-28 DIAGNOSIS — G47419 Narcolepsy without cataplexy: Secondary | ICD-10-CM

## 2013-11-28 DIAGNOSIS — F411 Generalized anxiety disorder: Secondary | ICD-10-CM

## 2013-11-28 MED ORDER — DULOXETINE HCL 60 MG PO CPEP: 60.0000 mg | ORAL_CAPSULE | Freq: Every day | ORAL | Status: DC

## 2013-11-28 NOTE — Patient Instructions (Signed)

## 2013-11-28 NOTE — Progress Notes (Signed)
Pre visit review using our clinic review tool, if applicable. No additional management support is needed unless otherwise documented below in the visit note. 

## 2013-11-28 NOTE — Progress Notes (Signed)
Subjective:    Patient ID: Cynthia [name], female    DOB: 04/30/1971, 43 y.o.   MRN: [id number]  HPI 43 year old caucasian female, nonsmoker presenting today for a follow-up for anxiety.  She was seen on 10/01/13, at that time she was started on Venlafaxine XR.  She says the medication is not working.  She is actually picking at her skin more. She has stayed up to 40 hours without sleep to get her work done.  She has taken four Klonopin in a day to relieve her stress. In the past, she has been on Paxil, Wellbutrin, and Cymbalta.  Cymbalta had worked in the past.    Review of Systems  Constitutional: Positive for activity change.       Increased workload  Respiratory: Negative.   Cardiovascular: Negative.   Skin: Positive for wound.       scratches and open scabs to chest, buttocks and  thighs  Neurological: Negative.  Negative for light-headedness and headaches.  Hematological: Negative.   Psychiatric/Behavioral: Positive for behavioral problems, sleep disturbance, self-injury and agitation. Negative for suicidal ideas. The patient is nervous/anxious.    Past Medical History  Diagnosis Date  . Anxiety and depression 04/03/2008  . Acne   . Hemorrhoids   . Basal cell cancer     History   Social History  . Marital Status: Married    Spouse Name: Cynthia [name]    Number of Children: 2  . Years of Education: Bachelors   Occupational History  . 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 Smoking status: Former Smoker -- 1.00 packs/day for 10 years    Types: Cigarettes    Quit date: 08/15/2003  . Smokeless tobacco: Never Used  . Alcohol Use: No     Comment: rare  . Drug Use: No  . Sexual Activity: Not on file   Other Topics Concern  . Not on file   Social History Narrative   Pt lives at home with husband Cynthia [name](Cynthia [name]) and two children   Pt is right handed    Education - Bachelors degree   Caffeine 1-2 cups a day    Past Surgical History  Procedure Laterality Date  .  Partial hysterectomy    . Bunionectomy    . Cesarean section      x 2  . Skin cancer excision      Family History  Problem Relation Age of Onset  . Prostate cancer Maternal Grandfather   . Kidney disease Sister   . Heart disease Paternal Grandfather   . Depression      Allergies  Allergen Reactions  . Doxycycline Hyclate     REACTION: heartburn  . Meclizine Hcl     REACTION: urticaria (hives)  . Morphine     Current Outpatient Prescriptions on File Prior to Visit  Medication Sig Dispense Refill  . amphetamine-dextroamphetamine (ADDERALL) 30 MG tablet Take 1 tablet (30 mg total) by mouth 2 (two) times daily. Take second dose no later than 3 PM  60 tablet  0  . clonazePAM (KLONOPIN) 0.5 MG tablet TAKE ONE TABLET BY MOUTH TWICE DAILY AS NEEDED FOR ANXIETY  60 tablet  3  . tretinoin (RETIN-A) 0.05 % cream Apply 1 application topically at bedtime.       No current facility-administered medications on file prior to visit.    BP 92/60  Pulse 88  Wt 141 lb (63.957 kg)    Objective:   Physical  Exam  Constitutional: She is oriented to person, place, and time. She appears well-developed and well-nourished. No distress.  Cardiovascular: Normal rate, regular rhythm and normal heart sounds.   Pulmonary/Chest: Effort normal and breath sounds normal.  Neurological: She is alert and oriented to person, place, and time. She has normal reflexes. No cranial nerve deficit.  Skin: Skin is warm and dry.  Scattered abrasions and dried scabs on buttocks, thighs, and chest  Psychiatric: She has a normal mood and affect. Judgment and thought content normal.  Increased anxiety.  Unable to sit without scratching          Assessment & Plan:  Assessment 1. Anxiety  Plan 1. Begin cymbalta 30 mg daily for one week, then 60mg  daily for maintenance.  2. Referral for therapist Cynthia [name].  3. Contact office for questions or concerns.

## 2013-12-08 ENCOUNTER — Telehealth: Payer: Self-pay | Admitting: *Deleted

## 2013-12-08 ENCOUNTER — Ambulatory Visit: Payer: Self-pay | Admitting: Pulmonary Disease

## 2013-12-08 DIAGNOSIS — G47411 Narcolepsy with cataplexy: Secondary | ICD-10-CM

## 2013-12-08 NOTE — Telephone Encounter (Signed)
Patient has appt scheduled for Sept. 17th with Dr. Katharina CaperAthar--she needs enough medication called in for Adderal until that appt--[facility]--thank you.

## 2013-12-08 NOTE — Telephone Encounter (Signed)
Patient requesting refill of amphetamine-dextroamphetamine (ADDERALL) 30 MG tablet.  Please call patient. Thank you

## 2013-12-08 NOTE — Telephone Encounter (Signed)
Patent did not show up for last appt.  Dr Frances FurbishAthar approved Rx last month, but asked that patient reschedule appt (this was noted on Rx).  It does not appear an appt has been scheduled.  I called patient back.  Got no answer, left message asking that she call back and schedule an appt.

## 2013-12-08 NOTE — Telephone Encounter (Signed)
Patient now has an appt scheduled in Sept.  Request forwarded to provider for approval.

## 2013-12-12 ENCOUNTER — Telehealth: Payer: Self-pay | Admitting: Neurology

## 2013-12-12 ENCOUNTER — Ambulatory Visit (INDEPENDENT_AMBULATORY_CARE_PROVIDER_SITE_OTHER): Payer: 59 | Admitting: Licensed Clinical Social Worker

## 2013-12-12 DIAGNOSIS — F4320 Adjustment disorder, unspecified: Secondary | ICD-10-CM

## 2013-12-12 DIAGNOSIS — G47411 Narcolepsy with cataplexy: Secondary | ICD-10-CM

## 2013-12-12 MED ORDER — AMPHETAMINE-DEXTROAMPHETAMINE 30 MG PO TABS: 30.0000 mg | ORAL_TABLET | Freq: Two times a day (BID) | ORAL | Status: DC

## 2013-12-12 NOTE — Telephone Encounter (Signed)
Refill Adderrall 30mg  bid. 60 tabs. 0 refill

## 2013-12-12 NOTE — Telephone Encounter (Signed)
Patient had to leave to return to work which is just down the street. She left her cell # [phone] to call when the Adderall Rx is ready to pick up and she'll come right back.

## 2013-12-12 NOTE — Telephone Encounter (Signed)
Patient came to the lobby today requesting refills of her Adderall. She is totally out of the medication and needs to have it refilled.

## 2013-12-12 NOTE — Telephone Encounter (Signed)
Pt came by office.   Needing her Adderall.  She has appt 04/2014.  Checked with [facility], Dr. Terrace ArabiaYan refilled for pt.  Pt to pick up.  Forwarded this to Dr. Frances FurbishAthar.

## 2013-12-23 ENCOUNTER — Telehealth: Payer: Self-pay | Admitting: Family

## 2013-12-23 NOTE — Telephone Encounter (Signed)
Error pt changed mind

## 2013-12-24 ENCOUNTER — Ambulatory Visit: Payer: Self-pay | Admitting: Nurse Practitioner

## 2013-12-25 ENCOUNTER — Encounter (HOSPITAL_COMMUNITY): Payer: Self-pay | Admitting: Emergency Medicine

## 2013-12-25 ENCOUNTER — Emergency Department (HOSPITAL_COMMUNITY): Payer: 59

## 2013-12-25 ENCOUNTER — Emergency Department (HOSPITAL_COMMUNITY)
Admission: EM | Admit: 2013-12-25 | Discharge: 2013-12-25 | Disposition: A | Discharge status: Home / Self Care | Payer: 59 | Attending: Emergency Medicine | Admitting: Emergency Medicine

## 2013-12-25 ENCOUNTER — Telehealth: Payer: Self-pay | Admitting: Family

## 2013-12-25 DIAGNOSIS — F329 Major depressive disorder, single episode, unspecified: Secondary | ICD-10-CM | POA: Insufficient documentation

## 2013-12-25 DIAGNOSIS — Z85828 Personal history of other malignant neoplasm of skin: Secondary | ICD-10-CM | POA: Insufficient documentation

## 2013-12-25 DIAGNOSIS — F411 Generalized anxiety disorder: Secondary | ICD-10-CM | POA: Insufficient documentation

## 2013-12-25 DIAGNOSIS — Z79899 Other long term (current) drug therapy: Secondary | ICD-10-CM | POA: Insufficient documentation

## 2013-12-25 DIAGNOSIS — F3289 Other specified depressive episodes: Secondary | ICD-10-CM | POA: Insufficient documentation

## 2013-12-25 DIAGNOSIS — G47419 Narcolepsy without cataplexy: Secondary | ICD-10-CM | POA: Insufficient documentation

## 2013-12-25 DIAGNOSIS — Z8679 Personal history of other diseases of the circulatory system: Secondary | ICD-10-CM | POA: Insufficient documentation

## 2013-12-25 DIAGNOSIS — Z87891 Personal history of nicotine dependence: Secondary | ICD-10-CM | POA: Insufficient documentation

## 2013-12-25 DIAGNOSIS — Z872 Personal history of diseases of the skin and subcutaneous tissue: Secondary | ICD-10-CM | POA: Insufficient documentation

## 2013-12-25 DIAGNOSIS — K8050 Calculus of bile duct without cholangitis or cholecystitis without obstruction: Secondary | ICD-10-CM

## 2013-12-25 DIAGNOSIS — K8020 Calculus of gallbladder without cholecystitis without obstruction: Secondary | ICD-10-CM | POA: Insufficient documentation

## 2013-12-25 DIAGNOSIS — Z90710 Acquired absence of both cervix and uterus: Secondary | ICD-10-CM | POA: Insufficient documentation

## 2013-12-25 HISTORY — DX: Narcolepsy without cataplexy: G47.419

## 2013-12-25 LAB — CBC WITH DIFFERENTIAL/PLATELET
BASOS ABS: 0 10*3/uL (ref 0.0–0.1)
BASOS PCT: 0 % (ref 0–1)
EOS PCT: 1 % (ref 0–5)
Eosinophils Absolute: 0.1 10*3/uL (ref 0.0–0.7)
HEMATOCRIT: 36.5 % (ref 36.0–46.0)
Hemoglobin: 12.6 g/dL (ref 12.0–15.0)
LYMPHS PCT: 37 % (ref 12–46)
Lymphs Abs: 2.3 10*3/uL (ref 0.7–4.0)
MCH: 30.6 pg (ref 26.0–34.0)
MCHC: 34.5 g/dL (ref 30.0–36.0)
MCV: 88.6 fL (ref 78.0–100.0)
MONO ABS: 0.5 10*3/uL (ref 0.1–1.0)
Monocytes Relative: 8 % (ref 3–12)
Neutro Abs: 3.3 10*3/uL (ref 1.7–7.7)
Neutrophils Relative %: 54 % (ref 43–77)
PLATELETS: 261 10*3/uL (ref 150–400)
RBC: 4.12 MIL/uL (ref 3.87–5.11)
RDW: 12.9 % (ref 11.5–15.5)
WBC: 6.2 10*3/uL (ref 4.0–10.5)

## 2013-12-25 LAB — URINALYSIS, ROUTINE W REFLEX MICROSCOPIC
Bilirubin Urine: NEGATIVE
Glucose, UA: NEGATIVE mg/dL
HGB URINE DIPSTICK: NEGATIVE
KETONES UR: NEGATIVE mg/dL
Nitrite: NEGATIVE
PROTEIN: NEGATIVE mg/dL
Specific Gravity, Urine: 1.018 (ref 1.005–1.030)
UROBILINOGEN UA: 1 mg/dL (ref 0.0–1.0)
pH: 6.5 (ref 5.0–8.0)

## 2013-12-25 LAB — COMPREHENSIVE METABOLIC PANEL
ALBUMIN: 4 g/dL (ref 3.5–5.2)
ALT: 52 U/L — AB (ref 0–35)
AST: 29 U/L (ref 0–37)
Alkaline Phosphatase: 94 U/L (ref 39–117)
BUN: 15 mg/dL (ref 6–23)
CALCIUM: 10.3 mg/dL (ref 8.4–10.5)
CO2: 29 meq/L (ref 19–32)
CREATININE: 0.66 mg/dL (ref 0.50–1.10)
Chloride: 97 mEq/L (ref 96–112)
GFR calc Af Amer: >90 mL/min (ref >90)
Glucose, Bld: 101 mg/dL — ABNORMAL HIGH (ref 70–99)
Potassium: 4.2 mEq/L (ref 3.7–5.3)
SODIUM: 135 meq/L — AB (ref 137–147)
TOTAL PROTEIN: 7.9 g/dL (ref 6.0–8.3)
Total Bilirubin: 0.3 mg/dL (ref 0.3–1.2)

## 2013-12-25 LAB — URINE MICROSCOPIC-ADD ON

## 2013-12-25 LAB — LIPASE, BLOOD: Lipase: 82 U/L — ABNORMAL HIGH (ref 11–59)

## 2013-12-25 MED ORDER — OXYCODONE-ACETAMINOPHEN 5-325 MG PO TABS: 1.0000 | ORAL_TABLET | Freq: Four times a day (QID) | ORAL | Status: DC | PRN

## 2013-12-25 MED ORDER — HYDROMORPHONE HCL PF 1 MG/ML IJ SOLN
1.0000 mg | Freq: Once | INTRAMUSCULAR | Status: AC
  Administered 2013-12-25: 1 mg via INTRAVENOUS
  Filled 2013-12-25: qty 1

## 2013-12-25 MED ORDER — GI COCKTAIL ~~LOC~~
30.0000 mL | Freq: Once | ORAL | Status: AC
  Administered 2013-12-25: 30 mL via ORAL
  Filled 2013-12-25: qty 30

## 2013-12-25 MED ORDER — SODIUM CHLORIDE 0.9 % IV BOLUS (SEPSIS)
500.0000 mL | Freq: Once | INTRAVENOUS | Status: AC
  Administered 2013-12-25: 500 mL via INTRAVENOUS

## 2013-12-25 MED ORDER — ONDANSETRON HCL 4 MG/2ML IJ SOLN
4.0000 mg | Freq: Once | INTRAMUSCULAR | Status: AC
  Administered 2013-12-25: 4 mg via INTRAVENOUS
  Filled 2013-12-25: qty 2

## 2013-12-25 NOTE — ED Notes (Signed)
Pt tolerated fluid challenge. Drank Dr. Reino KentPepper without difficulty or nausea.

## 2013-12-25 NOTE — ED Notes (Signed)
Patient transported to Ultrasound 

## 2013-12-25 NOTE — Telephone Encounter (Signed)
Patient Information:  Caller Name: Cynthia [name]  Phone: [phone]  Patient: Cynthia [name], Cynthia [name]  Gender: Female  DOB: 10/28/1970  Age: 43 Years  PCP: Adline Mangoampbell, [facility](Family Practice)  Pregnant: No  Office Follow Up:  Does the office need to follow up with this patient?: No  Instructions For The Office: N/A  RN Note:  Onset of right upper abdominal pain 12/23/13 but states has worsened to severe uncomfortable pain 12/25/13.  States cannot lie down, cannot sit, cannot stand due to pain.  Afebrile.  Pain has been constant > 2 hours; per upper abdominal pain protocol, advised being seen immediately/within 1 hour; no appts available in office within dispositioned time frame.  TC to office; per staff/Saundrea, patient to go to ED.  [facility]  krs/can  Symptoms  Reason For Call & Symptoms: right rib cage pain, radiating to under right shoulder blade in back  Reviewed Health History In EMR: Yes  Reviewed Medications In EMR: Yes  Reviewed Allergies In EMR: Yes  Reviewed Surgeries / Procedures: Yes  Date of Onset of Symptoms: 12/23/2013 OB / GYN:  LMP: Unknown  Guideline(s) Used:  Abdominal Pain - Upper  Disposition Per Guideline:   Go to ED Now (or to Office with PCP Approval)  Reason For Disposition Reached:   Constant abdominal pain lasting > 2 hours  Advice Given:  N/A  Patient Will Follow Care Advice:  YES

## 2013-12-25 NOTE — Telephone Encounter (Signed)
Noted  

## 2013-12-25 NOTE — ED Notes (Signed)
Called Lab to request Lipase be added to previous blood sent to Lab. Was told they could add lipase.

## 2013-12-25 NOTE — ED Notes (Signed)
Rt  Lower sided abd pain rt arm hurting nauseted  X 3 days worse today  Makes her back hurt

## 2013-12-25 NOTE — Discharge Instructions (Signed)

## 2013-12-25 NOTE — ED Notes (Signed)
Pt reports mid and RUQ abdominal pain that radiates to back since this past Saturday. Reports having intermittent abdominal pain that has gotten worse over the last day. Went to GI MD today and was told to come to the ER for further evaluation. Rates pain 10/10 at this time.

## 2013-12-25 NOTE — ED Provider Notes (Signed)
CSN: [id number]     Arrival date & time 12/25/13  1316 History   First MD Initiated Contact with Patient 12/25/13 1541     Chief Complaint  Patient presents with  . Abdominal Pain     (Consider location/radiation/quality/duration/timing/severity/associated sxs/prior Treatment) Patient is a 43 y.o. female presenting with abdominal pain. The history is provided by the patient.  Abdominal Pain Pain location:  RUQ Pain quality: sharp   Pain radiates to:  R flank Pain severity:  Severe Onset quality:  Gradual Duration:  2 days Timing:  Constant Progression:  Worsening Chronicity:  New Context: eating   Context: not recent illness, not retching, not sick contacts and not trauma   Relieved by:  Position changes Worsened by:  Eating Ineffective treatments:  None tried Associated symptoms: no anorexia, no chills, no diarrhea, no dysuria, no fever, no hematuria, no nausea and no vomiting     Past Medical History  Diagnosis Date  . Anxiety and depression 04/03/2008  . Acne   . Hemorrhoids   . Basal cell cancer   . Narcolepsy    Past Surgical History  Procedure Laterality Date  . Partial hysterectomy    . Bunionectomy    . Cesarean section      x 2  . Skin cancer excision     Family History  Problem Relation Age of Onset  . Prostate cancer Maternal Grandfather   . Kidney disease Sister   . Heart disease Paternal Grandfather   . Depression     History  Substance Use Topics  . Smoking status: Former Smoker -- 1.00 packs/day for 10 years    Types: Cigarettes    Quit date: 08/15/2003  . Smokeless tobacco: Never Used  . Alcohol Use: No     Comment: rare   OB History   Grav Para Term Preterm Abortions TAB SAB Ect Mult Living                 Review of Systems  Constitutional: Negative for fever and chills.  Gastrointestinal: Positive for abdominal pain. Negative for nausea, vomiting, diarrhea and anorexia.  Genitourinary: Negative for dysuria and hematuria.  All  other systems reviewed and are negative.     Allergies  Doxycycline hyclate; Meclizine hcl; and Morphine  Home Medications   Prior to Admission medications   Medication Sig Start Date End Date Taking? Authorizing Provider  amphetamine-dextroamphetamine (ADDERALL) 30 MG tablet Take 1 tablet (30 mg total) by mouth 2 (two) times daily. Take second dose no later than 3 PM 12/12/13  Yes Levert FeinsteinYijun Yan, MD  clonazePAM (KLONOPIN) 0.5 MG tablet Take 0.5 mg by mouth 2 (two) times daily as needed for anxiety.   Yes Historical Provider, MD  DULoxetine (CYMBALTA) 60 MG capsule Take 1 capsule (60 mg total) by mouth daily. 11/28/13  Yes Baker PieriniPadonda B Campbell, FNP  Salicylic Acid (NEUTROGENA OIL-FREE ACNE WASH EX) Apply 1 application topically 2 (two) times daily.   Yes Historical Provider, MD   BP 110/67  Pulse 92  Temp(Src) 98.2 F (36.8 C) (Oral)  Resp 16  SpO2 100% Physical Exam  Nursing note and vitals reviewed. Constitutional: She is oriented to person, place, and time. She appears well-developed and well-nourished. No distress.  HENT:  Head: Normocephalic and atraumatic.  Eyes: EOM are normal. Pupils are equal, round, and reactive to light.  Neck: Normal range of motion. Neck supple.  Cardiovascular: Normal rate and regular rhythm.  Exam reveals no friction rub.   No murmur  heard. Pulmonary/Chest: Effort normal and breath sounds normal. No respiratory distress. She has no wheezes. She has no rales.  Abdominal: Soft. She exhibits no distension. There is tenderness (RUQ). There is no rebound.  Musculoskeletal: Normal range of motion. She exhibits no edema.  Neurological: She is alert and oriented to person, place, and time.  Skin: She is not diaphoretic.    ED Course  Procedures (including critical care time) Labs Review Labs Reviewed  URINALYSIS, ROUTINE W REFLEX MICROSCOPIC - Abnormal; Notable for the following:    APPearance HAZY (*)    Leukocytes, UA TRACE (*)    All other components  within normal limits  COMPREHENSIVE METABOLIC PANEL - Abnormal; Notable for the following:    Sodium 135 (*)    Glucose, Bld 101 (*)    ALT 52 (*)    All other components within normal limits  URINE MICROSCOPIC-ADD ON - Abnormal; Notable for the following:    Squamous Epithelial / LPF FEW (*)    Bacteria, UA FEW (*)    All other components within normal limits  CBC WITH DIFFERENTIAL  LIPASE, BLOOD    Imaging Review [place] Abdomen Limited Ruq  12/25/2013   CLINICAL DATA:  RUQ pain  EXAM: US ABDOMEN LIMITED - RIGHT UPPER QUADRANT  COMPARISON:  None.  FINDINGS: Gallbladder:  No gallstones or wall thickening visualized, measuring 1.6 mm. No sonographic Murphy sign noted.  Common bile duct:  Diameter: 4.5 mm  Liver:  No focal lesion identified. Within normal limits in parenchymal echogenicity.  IMPRESSION: Negative right upper quadrant abdominal ultrasound.   Electronically Signed   By: Salome HolmesHector  Cooper M.D.   On: 12/25/2013 17:11     EKG Interpretation None      MDM   Final diagnoses:  Biliary colic    46F presents with RUQ pain. Intermittent past few days, constant today. Associated nausea with eating, worse with eating. Alleviating with changes in position. Sharp, radiating to R flank. No dysuria/hematuria. No diarrhea. No other abdominal pain. No fever, cough, SOB. On exam, vitals stable. On abdominal exam, RUQ tender on deep palpation, no other abdominal pain. Concern for cholelithiasis, cholecystitis. Patient given pain meds, GI cocktail. Will check RUQ US. Labs reassuring, done in triage. Normal bili, no leukocytosis. RUQ US normal. Pain likely secondary to biliary colic. Tolerating PO here. Instructed to f/u with General Surgery.   Dagmar HaitWilliam Darianne Muralles, MD 12/25/13 [phone]

## 2013-12-26 ENCOUNTER — Telehealth: Payer: Self-pay | Admitting: Internal Medicine

## 2013-12-26 NOTE — Telephone Encounter (Signed)
Patient was seen in the ER for biliary colic.  She wants to follow up with Gi for continued abdominal pain.  She will come in and see Doug SouJessica Zehr, PA on 12/31/13 10:00

## 2013-12-29 ENCOUNTER — Ambulatory Visit (INDEPENDENT_AMBULATORY_CARE_PROVIDER_SITE_OTHER): Payer: 59 | Admitting: Nurse Practitioner

## 2013-12-29 ENCOUNTER — Ambulatory Visit: Payer: 59 | Admitting: Family

## 2013-12-29 ENCOUNTER — Encounter: Payer: Self-pay | Admitting: Nurse Practitioner

## 2013-12-29 VITALS — BP 99/68 | HR 83 | Wt 146.0 lb

## 2013-12-29 DIAGNOSIS — F513 Sleepwalking [somnambulism]: Secondary | ICD-10-CM

## 2013-12-29 DIAGNOSIS — G478 Other sleep disorders: Secondary | ICD-10-CM

## 2013-12-29 DIAGNOSIS — G47411 Narcolepsy with cataplexy: Secondary | ICD-10-CM

## 2013-12-29 MED ORDER — AMPHETAMINE-DEXTROAMPHETAMINE 30 MG PO TABS: 30.0000 mg | ORAL_TABLET | Freq: Two times a day (BID) | ORAL | Status: DC

## 2013-12-29 NOTE — Patient Instructions (Signed)
Dr. Frances FurbishAthar is going to work out your titration schedule for your Xyrem.   Please keep her updated on your progress.  Continue your Adderal at the same dose, call for refills.  Follow up with Dr. Frances FurbishAthar at next scheduled visit.
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
## 2013-12-30 ENCOUNTER — Encounter: Payer: Self-pay | Admitting: *Deleted

## 2013-12-31 ENCOUNTER — Ambulatory Visit: Payer: Self-pay | Admitting: Gastroenterology

## 2014-01-01 ENCOUNTER — Ambulatory Visit (INDEPENDENT_AMBULATORY_CARE_PROVIDER_SITE_OTHER): Payer: 59 | Admitting: General Surgery

## 2014-01-01 ENCOUNTER — Encounter (INDEPENDENT_AMBULATORY_CARE_PROVIDER_SITE_OTHER): Payer: Self-pay | Admitting: General Surgery

## 2014-01-01 ENCOUNTER — Telehealth (INDEPENDENT_AMBULATORY_CARE_PROVIDER_SITE_OTHER): Payer: Self-pay | Admitting: *Deleted

## 2014-01-01 VITALS — BP 92/66 | HR 90 | Temp 97.6°F | Ht 61.0 in | Wt 146.0 lb

## 2014-01-01 DIAGNOSIS — R1011 Right upper quadrant pain: Secondary | ICD-10-CM | POA: Insufficient documentation

## 2014-01-01 MED ORDER — PANTOPRAZOLE SODIUM 40 MG PO TBEC
40.0000 mg | DELAYED_RELEASE_TABLET | Freq: Every day | ORAL | Status: DC
Start: 2014-01-01 — End: 2014-07-31

## 2014-01-01 NOTE — Progress Notes (Signed)
Patient ID: Cynthia [name], female   DOB: 03-21-1971, 43 y.o.   MRN: [id number]  Chief Complaint  Patient presents with  . eval gallbladder    HPI Cynthia [name] is a 43 y.o. female.  It appears that she is referred either by Dr. Marena Chancy in the emergency department, or bilateral Gaster for evaluation of right upper quadrant pain.  The patient states that she was having right upper quadrant pain 3 years ago and underwent an extensive evaluation by Dr. Stan Head including a hepatobiliary scan which was normal. She's been diagnosed with pyelonephritis by CT scan in June of 2014 and  was treated for that. She hasn't really had much in the way of right upper quadrant pain until the last week. She says she is now having daily pain and nausea but no vomiting. She is constipated but is not taking stool softeners. She is not taking any proton pump inhibitors.  She went to the emergency department on 12/25/2013. Ultrasound is completely normal. All lab work is normal except for a lipase of 82. She was scheduled to see  Dr. Leone Payor, on May 20 and she states that they told her to come here first.  Comorbidities include anxiety and depression, headaches, narcolepsy( has Seen Dr, Shelle Iron in past),     cataplexy,  attention deficit disorder(followed by neurology).    She's had a hysterectomy and one ovary removed. 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  Anesthesia is negative for gallbladder surgery.  HPI  Past Medical History  Diagnosis Date  . Anxiety and depression 04/03/2008  . Acne   . Hemorrhoids   . Basal cell cancer   . Narcolepsy     Past Surgical History  Procedure Laterality Date  . Partial hysterectomy    . Bunionectomy    . Cesarean section      x 2  . Skin cancer excision    . Esophagogastroduodenoscopy  2011    Dr. Jola Babinski     Family History  Problem Relation Age of Onset  . Prostate cancer Maternal Grandfather   . Kidney disease  Sister   . Heart disease Paternal Grandfather   . Depression      Social History History  Substance Use Topics  . Smoking status: Former Smoker -- 1.00 packs/day for 10 years    Types: Cigarettes    Quit date: 08/15/2003  . Smokeless tobacco: Never Used  . Alcohol Use: No     Comment: rare    Allergies  Allergen Reactions  . Doxycycline Hyclate Other (See Comments)     heartburn  . Meclizine Hcl Hives  . Morphine Itching    severe    Current Outpatient Prescriptions  Medication Sig Dispense Refill  . amphetamine-dextroamphetamine (ADDERALL) 30 MG tablet Take 1 tablet (30 mg total) by mouth 2 (two) times daily. Take second dose no later than 3 PM  60 tablet  0  . clonazePAM (KLONOPIN) 0.5 MG tablet Take 0.5 mg by mouth 2 (two) times daily as needed for anxiety.      . DULoxetine (CYMBALTA) 60 MG capsule Take 1 capsule (60 mg total) by mouth daily.  30 capsule  3  . oxyCODONE-acetaminophen (PERCOCET/ROXICET) 5-325 MG per tablet Take 1 tablet by mouth every 6 (six) hours as needed for moderate pain or severe pain.  30 tablet  0  . Salicylic Acid (NEUTROGENA OIL-FREE ACNE WASH EX) Apply 1 application topically 2 (two) times daily.      Marland Kitchen  pantoprazole (PROTONIX) 40 MG tablet Take 1 tablet (40 mg total) by mouth daily.  30 tablet  2   No current facility-administered medications for this visit.    Review of Systems Review of Systems  Constitutional: Negative for fever, chills and unexpected weight change.  HENT: Negative for congestion, hearing loss, sore throat, trouble swallowing and voice change.   Eyes: Negative for visual disturbance.  Respiratory: Negative for cough and wheezing.   Cardiovascular: Negative for chest pain, palpitations and leg swelling.  Gastrointestinal: Positive for nausea, abdominal pain, constipation and rectal pain. Negative for vomiting, diarrhea, blood in stool, abdominal distention and anal bleeding.  Genitourinary: Negative for hematuria, vaginal  bleeding and difficulty urinating.  Musculoskeletal: Negative for arthralgias.  Skin: Negative for rash and wound.  Neurological: Positive for headaches. Negative for seizures and syncope.  Hematological: Negative for adenopathy. Does not bruise/bleed easily.  Psychiatric/Behavioral: Positive for sleep disturbance. Negative for confusion. The patient is nervous/anxious.     Blood pressure 92/66, pulse 90, temperature 97.6 F (36.4 C), height 5\' 1"  (1.549 m), weight 146 lb (66.225 kg).  Physical Exam Physical Exam  Constitutional: She is oriented to person, place, and time. She appears well-developed and well-nourished. No distress.  Alert. Cooperative. Pleasant. Appears unhappy about her situation.  HENT:  Head: Normocephalic and atraumatic.  Nose: Nose normal.  Mouth/Throat: No oropharyngeal exudate.  Eyes: Conjunctivae and EOM are normal. Pupils are equal, round, and reactive to light. Left eye exhibits no discharge. No scleral icterus.  Neck: Neck supple. No JVD present. No tracheal deviation present. No thyromegaly present.  Cardiovascular: Normal rate, regular rhythm, normal heart sounds and intact distal pulses.   No murmur heard. Pulmonary/Chest: Effort normal and breath sounds normal. No respiratory distress. She has no wheezes. She has no rales. She exhibits no tenderness.  Abdominal: Soft. Bowel sounds are normal. She exhibits no distension and no mass. There is no tenderness. There is no rebound and no guarding.  Infraumbilical scar, healed, from hysterectomy. Subjectively tender in the right upper quadrant but no mass, distention. Otherwise abdominal exam benign.  Musculoskeletal: She exhibits no edema and no tenderness.  Lymphadenopathy:    She has no cervical adenopathy.  Neurological: She is alert and oriented to person, place, and time. She exhibits normal muscle tone. Coordination normal.  Skin: Skin is warm. No rash noted. She is not diaphoretic. No erythema. No  pallor.  Psychiatric: She has a normal mood and affect. Her behavior is normal. Judgment and thought content normal.    Data Reviewed Office notes from numerous position. The emergency department notes. Numerous imaging studies. Laboratory testing.  Assessment    Right upper quadrant abdominal pain. Etiology unclear. Workup to date is not sufficient to indicate cholecystectomy, as outcome would be completely unpredictable. Gallbladder disease as possible. Peptic ulcer disease as possible.  Anxiety depression  Narcolepsy On Klonopin, Cymbalta, Adderall  Status post hysterectomy     Plan    Had a long discussion with the patient about her symptoms, her workup today, and the lack of predicted outcome of cholecystectomy. She said the other doctors told her she and her gallbladder out regardless of the workup. I told her we would try to help her.  Avoid cholecystectomy for now, at least in the short term  Protonix 40 mg a day, start now is empiric trial.  HIDA Scan with ejection fraction. If that is negative consider CT scan of the abdomen and pelvis with contrast. If that is negative consider  upper endoscopy.  Low-fat diet  Return to see me in 2 weeks.        Ernestene MentionHaywood M Joanathan Affeldt 01/01/2014, 10:16 AM

## 2014-01-01 NOTE — Telephone Encounter (Signed)
LM for pt to return my call regarding her appt for NM Hepat W/Ejecet Frac [facility]15 arrive at 8:45 a.m NPO after midnight Must hold her Oxycodone and Adderall 6 hours prior to her test.  Thanks!  Victorino DikeJennifer

## 2014-01-01 NOTE — Patient Instructions (Signed)
The cause of your right upper quadrant pain is not clear. All of your gallbladder tests, going back to 2011, have been negative, including a recent ultrasound. The only lab tests is abnormal is a slightly elevated lipase, which can come from the pancreas.  There is no current evidence to suggest that gallbladder surgery would resolve her symptoms.  At this point in time, further evaluation is indicated before consideration of surgery.  We have called in a prescription for Protonix, 40 mg a day and, to reduce the acid production your stomach to see if that helps.  Low-fat diet as advised  You'll be scheduled for a hepatobiliary scan with ejection fraction. If that is abnormal then we will consider cholecystectomy. If that is normal then we will proceed with CT scan of the abdomen and pelvis.  If all of this is normal, then you'll be referred back to Dr. Leone PayorGessner  for consideration of upper endoscopy to be sure you don't have an ulcer.

## 2014-01-06 NOTE — Addendum Note (Signed)
Addended by: Huston Foley on: 01/06/2014 09:51 AM   Modules accepted: Level of Service

## 2014-01-07 ENCOUNTER — Telehealth (INDEPENDENT_AMBULATORY_CARE_PROVIDER_SITE_OTHER): Payer: Self-pay

## 2014-01-07 NOTE — Telephone Encounter (Signed)
Rescheduled HIDA scan for 01/08/14 arrive at 6:45. Informed pt nothing to eat or drink after midnight and to hold Oxycodone and Addrell 6 hours before the test.

## 2014-01-07 NOTE — Telephone Encounter (Signed)
Reschedule HIDA with ejection fraction urgently. Call radiology and explain situation.  Do test tomorrow somewhere.  hmi

## 2014-01-07 NOTE — Telephone Encounter (Signed)
Informed pt and she verbalized understanding.

## 2014-01-07 NOTE — Telephone Encounter (Signed)
Pt was seen on 01/01/14 for eval of her gallbladder. Pt was scheduled for a HIDA scan but it is not until 01/14/14. Pt states that she is now having right upper quadrant pain and sweats. Pt states that the past two days she has been nauseous and had some vomiting. Pt denies any fevers or chills. Pt states that her BM's have been a little bloody but they have been this way since the birth of her daughter. Pt is taking protonix and percocet with some relief. Informed the patient that I would send Dr Derrell Lolling and his nurse a message for further recommendations. Informed pt that she may want to see if she can reschedule the HIDA scan a little sooner. Pt verbalized understanding and agrees with POC.

## 2014-01-08 ENCOUNTER — Telehealth: Payer: Self-pay | Admitting: Internal Medicine

## 2014-01-08 ENCOUNTER — Telehealth (INDEPENDENT_AMBULATORY_CARE_PROVIDER_SITE_OTHER): Payer: Self-pay

## 2014-01-08 ENCOUNTER — Ambulatory Visit (HOSPITAL_COMMUNITY)
Admission: RE | Admit: 2014-01-08 | Discharge: 2014-01-08 | Disposition: A | Discharge status: Home / Self Care | Payer: 59 | Source: Ambulatory Visit | Attending: General Surgery | Admitting: General Surgery

## 2014-01-08 DIAGNOSIS — R112 Nausea with vomiting, unspecified: Secondary | ICD-10-CM | POA: Insufficient documentation

## 2014-01-08 DIAGNOSIS — R1011 Right upper quadrant pain: Secondary | ICD-10-CM | POA: Insufficient documentation

## 2014-01-08 MED ORDER — TECHNETIUM TC 99M MEBROFENIN IV KIT
5.0000 | PACK | Freq: Once | INTRAVENOUS | Status: AC | PRN
  Administered 2014-01-08: 5 via INTRAVENOUS

## 2014-01-08 MED ORDER — SINCALIDE 5 MCG IJ SOLR
0.0200 ug/kg | Freq: Once | INTRAMUSCULAR | Status: AC
  Administered 2014-01-08: 1.29 ug via INTRAVENOUS

## 2014-01-08 MED ORDER — SINCALIDE 5 MCG IJ SOLR
INTRAMUSCULAR | Status: AC
  Administered 2014-01-08: 1.29 ug via INTRAVENOUS
  Filled 2014-01-08: qty 5

## 2014-01-08 NOTE — Telephone Encounter (Signed)
Per Dr Jacinto Halim request pt notified of negative biliary scan. Pt advised to follow up with Dr Leone Payor. Pt states she understands and will contact his office.

## 2014-01-08 NOTE — Telephone Encounter (Signed)
Message copied by Joanette Gula on Thu Jan 08, 2014 11:48 AM ------      Message from: Ernestene Mention      Created: Thu Jan 08, 2014 11:22 AM       Call radiology reports to patient.Biliary scan with ejection fraction is completely normal. This makes it unlikely that her pain is due to her gallbladder. Most likely she should be evaluated by a gastroenterologist.                  hmi       ------

## 2014-01-08 NOTE — Telephone Encounter (Signed)
Patient will come in and see Willette Cluster RNP tomorrow am at 9:30

## 2014-01-09 ENCOUNTER — Encounter: Payer: Self-pay | Admitting: Nurse Practitioner

## 2014-01-09 ENCOUNTER — Ambulatory Visit (INDEPENDENT_AMBULATORY_CARE_PROVIDER_SITE_OTHER): Payer: 59 | Admitting: Nurse Practitioner

## 2014-01-09 VITALS — BP 92/64 | HR 96 | Ht 61.75 in | Wt 146.0 lb

## 2014-01-09 DIAGNOSIS — F411 Generalized anxiety disorder: Secondary | ICD-10-CM

## 2014-01-09 DIAGNOSIS — R1011 Right upper quadrant pain: Secondary | ICD-10-CM

## 2014-01-09 MED ORDER — OXYCODONE-ACETAMINOPHEN 5-325 MG PO TABS: 1.0000 | ORAL_TABLET | Freq: Four times a day (QID) | ORAL | Status: DC | PRN

## 2014-01-09 NOTE — Patient Instructions (Signed)
We have given you a prescription to take to your pharmacy for Percocet/Roxicet.   Willette Cluster NP-C will discuss with Dr. Rob Bunting the problems you are having and he will review this regarding the Endoscopy Ultrasound. His assistant Alexia Freestone will call you with the date and time and instructions.

## 2014-01-10 ENCOUNTER — Encounter: Payer: Self-pay | Admitting: Nurse Practitioner

## 2014-01-10 NOTE — Progress Notes (Signed)
HPI :  Patient is a 43 year old female evaluated by Dr. Leone Payor September 2011 for RUQ pain and postprandial nausea. U/S was normal. HIDA normal. EGD negative. Pain eventually resolved but returned a year later. Patient saw PCP who felt pain was musculoskeletal and prescribed naproxen. Pain recurred around Feb 2013, started on Cymbalta. Patient seen in ED two weeks ago for RUQ pain and nausea. RUQ u/s negative. Patient told she had biliary colic and referred to surgeon. She saw Dr. Derrell Lolling who ordered HIDA. EF was normal. Patient referred here. Pain same as it was in 2011. She is frustrated at lack of answers. She has stressful job and family members with psychiatric problems. Patient wonders if stress could be causing her GI symptoms.   Patient has chronic headaches and has been treated with elavil and vicodin. PCP tried various NSAIDs and also Ultram for headaches but those didn't help. She has been followed by Neuro for headaches.   Patient diagnosed with narcolepsy in 2013, treated with Adderall which has led to some OCD behavior (skin picking).    Past Medical History  Diagnosis Date  . Anxiety and depression 04/03/2008  . Acne   . Hemorrhoids   . Basal cell cancer     forehead  . Narcolepsy   . Reflux     Family History  Problem Relation Age of Onset  . Prostate cancer Maternal Grandfather   . Kidney disease Sister   . Heart disease Paternal Grandfather   . Depression     History  Substance Use Topics  . Smoking status: Former Smoker -- 1.00 packs/day for 10 years    Types: Cigarettes    Quit date: 08/15/2003  . Smokeless tobacco: Never Used  . Alcohol Use: No     Comment: rare   Current Outpatient Prescriptions  Medication Sig Dispense Refill  . amphetamine-dextroamphetamine (ADDERALL) 30 MG tablet Take 1 tablet (30 mg total) by mouth 2 (two) times daily. Take second dose no later than 3 PM  60 tablet  0  . clonazePAM (KLONOPIN) 0.5 MG tablet Take 0.5 mg by mouth 2  (two) times daily as needed for anxiety.      . DULoxetine (CYMBALTA) 60 MG capsule Take 1 capsule (60 mg total) by mouth daily.  30 capsule  3  . oxyCODONE-acetaminophen (PERCOCET/ROXICET) 5-325 MG per tablet Take 1 tablet by mouth every 6 (six) hours as needed for moderate pain or severe pain.  30 tablet  0  . pantoprazole (PROTONIX) 40 MG tablet Take 1 tablet (40 mg total) by mouth daily.  30 tablet  2  . Salicylic Acid (NEUTROGENA OIL-FREE ACNE WASH EX) Apply 1 application topically 2 (two) times daily.       No current facility-administered medications for this visit.   Allergies  Allergen Reactions  . Doxycycline Hyclate Other (See Comments)     heartburn  . Meclizine Hcl Hives  . Morphine Itching    severe   Review of Systems: All systems reviewed and negative except where noted in HPI.    Nm Hepato W/eject Fract  01/08/2014   CLINICAL DATA:  Right upper quadrant pain.  Nausea and vomiting.  EXAM: NUCLEAR MEDICINE HEPATOBILIARY IMAGING WITH GALLBLADDER EF  TECHNIQUE: Sequential images of the abdomen were obtained out to 60 minutes following intravenous administration of radiopharmaceutical. After slow intravenous infusion of 1.29 micrograms Cholecystokinin, gallbladder ejection fraction was determined.  RADIOPHARMACEUTICALS:  5.0 Millicurie Tc-70m Choletec  COMPARISON:  None.  FINDINGS: The  gallbladder was visualized at 10 min. Activity is present in the bowel at 25 min. Ejection fraction at 30 min was 65.3%. At 30 min, normal ejection fraction is greater than 30%.  The patient did experience pain during CCK infusion.  IMPRESSION: Normal hepatobiliary scan with normal ejection fraction.   Electronically Signed   By: Jim  Maxwell M.D.   On: 01/08/2014 09:35   Us Abdomen Limited Ruq  12/25/2013   CLINICAL DATA:  RUQ pain  EXAM: US ABDOMEN LIMITED - RIGHT UPPER QUADRANT  COMPARISON:  None.  FINDINGS: Gallbladder:  No gallstones or wall thickening visualized, measuring 1.6 mm. No  sonographic Murphy sign noted.  Common bile duct:  Diameter: 4.5 mm  Liver:  No focal lesion identified. Within normal limits in parenchymal echogenicity.  IMPRESSION: Negative right upper quadrant abdominal ultrasound.   Electronically Signed   By: Hector  Cooper M.D.   On: 12/25/2013 17:11    CTscan abd/pelvis with contrast June 2014 IMPRESSION:  Heterogeneous nephrograms and both kidneys suggest pyelonephritis.  Appendix is normal. Diffusely stool filled colon.    Physical Exam: BP 92/64  Pulse 96  Ht 5' 1.75" (1.568 m)  Wt 146 lb (66.225 kg)  BMI 26.94 kg/m2 Constitutional: Pleasant,well-developed, white female in no acute distress. HEENT: Normocephalic and atraumatic. Conjunctivae are normal. No scleral icterus. Neck supple.  Cardiovascular: Normal rate, regular rhythm.  Pulmonary/chest: Effort normal and breath sounds normal. No wheezing, rales or rhonchi. Abdominal: Soft, nondistended, nontender. Bowel sounds active throughout. There are no masses palpable. No hepatomegaly. Extremities: no edema Lymphadenopathy: No cervical adenopathy noted. Neurological: Alert and oriented to person place and time. Skin: Skin is warm and dry. No rashes noted. Psychiatric: Normal mood and affect. Behavior is normal.   ASSESSMENT AND PLAN:  1. 42 year old female with chronic, intermittent RUQ pain radiating around to right back / nausea. Extensive workup through the years has been negative. She had a surgical evaluation for gallbladder disease earlier this month. HIDA negative, no plans for cholecystectomy. Offered referral to tertiary care center for further evaluation. We also discussed EUS for further evaluation (r/o small stones or other biliary abnormalities). At this point she would like to pursue EUS. She requested refill on pain medication which I did refill. If EUS negative then doubtful we will have much else to offer her.    2. Anxiety / depression / narcolepsy   3.  ? Hx of  pyleonephritis (June 2014 CTscan) 

## 2014-01-12 ENCOUNTER — Telehealth (INDEPENDENT_AMBULATORY_CARE_PROVIDER_SITE_OTHER): Payer: Self-pay

## 2014-01-12 ENCOUNTER — Telehealth: Payer: Self-pay | Admitting: Nurse Practitioner

## 2014-01-12 ENCOUNTER — Telehealth: Payer: Self-pay | Admitting: Gastroenterology

## 2014-01-12 NOTE — Progress Notes (Signed)
Agree with Ms. Guenther's assessment and plan. Iva Boop, MD, [facility]  Will see if Dr. Christella Hartigan thinks this is reasonable - (EUS) - ccing him.

## 2014-01-12 NOTE — Telephone Encounter (Signed)
Katiet with [employer] [facility] requesting a return call regarding Rx directions.  Please return call to [phone] option [phone].  Thanks

## 2014-01-12 NOTE — Telephone Encounter (Signed)
I called back.  Spoke with Raven.  Verified the Rx with her.  They will process it today.

## 2014-01-12 NOTE — Telephone Encounter (Signed)
I left a message for the patient advising Dr. Christella Hartigan told Cynthia [name] the nurse practioner he would do the EUS.  He said he will have his CMA Patty schedule this.  I LM for the patient that we will call her once I speak to [facility] tomorrow 01-13-14 about the date.

## 2014-01-12 NOTE — Telephone Encounter (Signed)
LMOM for pt to CB. Please let the pt know that her upcoming appt has been cancelled, per Dr. Derrell Lolling due to her normal gallbladder test results.

## 2014-01-12 NOTE — Telephone Encounter (Signed)
Pt returned the call, and the message from below was given.

## 2014-01-12 NOTE — Progress Notes (Signed)
EUS is reasonable next step.  I'll send this to Patty as well so that she can scheduled EUS, radial +/- linear, ++MAC, next available for RUQ pain.

## 2014-01-13 ENCOUNTER — Other Ambulatory Visit: Payer: Self-pay

## 2014-01-13 ENCOUNTER — Telehealth: Payer: Self-pay

## 2014-01-13 DIAGNOSIS — R1011 Right upper quadrant pain: Secondary | ICD-10-CM

## 2014-01-13 NOTE — Telephone Encounter (Signed)
EUS scheduled, pt instructed and medications reviewed.  Patient instructions mailed to home.  Patient to call with any questions or concerns.  

## 2014-01-13 NOTE — Telephone Encounter (Signed)
Message copied by Donata Duff on Tue Jan 13, 2014  8:27 AM ------      Message from: Rob Bunting P      Created: Mon Jan 12, 2014  4:04 PM                   ----- Message -----         From: Iva Boop, MD         Sent: 01/12/2014   4:02 PM           To: Rachael Fee, MD, Meredith Pel, NP                        ----- Message -----         From: Meredith Pel, NP         Sent: 01/10/2014  10:47 AM           To: Iva Boop, MD             ------

## 2014-01-13 NOTE — Telephone Encounter (Signed)
I will let Pam know when this is scheduled

## 2014-01-13 NOTE — Telephone Encounter (Signed)
EUS is reasonable next step.  I'll send this to Aubriauna Riner as well so that she can scheduled EUS, radial +/- linear, ++MAC, next available for RUQ pain. 

## 2014-01-14 ENCOUNTER — Ambulatory Visit (HOSPITAL_COMMUNITY): Payer: 59

## 2014-01-15 ENCOUNTER — Encounter (INDEPENDENT_AMBULATORY_CARE_PROVIDER_SITE_OTHER): Payer: 59 | Admitting: General Surgery

## 2014-01-16 ENCOUNTER — Encounter (HOSPITAL_COMMUNITY): Payer: Self-pay | Admitting: *Deleted

## 2014-01-22 ENCOUNTER — Encounter (HOSPITAL_COMMUNITY): Payer: Self-pay

## 2014-01-22 ENCOUNTER — Encounter (HOSPITAL_COMMUNITY)
Admission: RE | Disposition: A | Discharge status: Home / Self Care | Payer: Self-pay | Source: Ambulatory Visit | Attending: Gastroenterology

## 2014-01-22 ENCOUNTER — Encounter (HOSPITAL_COMMUNITY): Payer: 59 | Admitting: Anesthesiology

## 2014-01-22 ENCOUNTER — Ambulatory Visit (HOSPITAL_COMMUNITY)
Admission: RE | Admit: 2014-01-22 | Discharge: 2014-01-22 | Disposition: A | Discharge status: Home / Self Care | Payer: 59 | Source: Ambulatory Visit | Attending: Gastroenterology | Admitting: Gastroenterology

## 2014-01-22 ENCOUNTER — Ambulatory Visit (HOSPITAL_COMMUNITY): Payer: 59 | Admitting: Anesthesiology

## 2014-01-22 DIAGNOSIS — Z87891 Personal history of nicotine dependence: Secondary | ICD-10-CM | POA: Insufficient documentation

## 2014-01-22 DIAGNOSIS — K219 Gastro-esophageal reflux disease without esophagitis: Secondary | ICD-10-CM | POA: Insufficient documentation

## 2014-01-22 DIAGNOSIS — Z79899 Other long term (current) drug therapy: Secondary | ICD-10-CM | POA: Insufficient documentation

## 2014-01-22 DIAGNOSIS — R1011 Right upper quadrant pain: Secondary | ICD-10-CM | POA: Insufficient documentation

## 2014-01-22 DIAGNOSIS — Z85828 Personal history of other malignant neoplasm of skin: Secondary | ICD-10-CM | POA: Insufficient documentation

## 2014-01-22 HISTORY — DX: Gastro-esophageal reflux disease without esophagitis: K21.9

## 2014-01-22 HISTORY — DX: Other specified postprocedural states: R11.2

## 2014-01-22 HISTORY — DX: Anxiety disorder, unspecified: F41.9

## 2014-01-22 HISTORY — DX: Adverse effect of unspecified anesthetic, initial encounter: T41.45XA

## 2014-01-22 HISTORY — DX: Other complications of anesthesia, initial encounter: T88.59XA

## 2014-01-22 HISTORY — PX: EUS: SHX5427

## 2014-01-22 HISTORY — DX: Other specified postprocedural states: Z98.890

## 2014-01-22 SURGERY — UPPER ENDOSCOPIC ULTRASOUND (EUS) LINEAR
Anesthesia: Monitor Anesthesia Care

## 2014-01-22 MED ORDER — LIDOCAINE HCL 1 % IJ SOLN
INTRAMUSCULAR | Status: DC | PRN
  Administered 2014-01-22: 50 mg via INTRADERMAL

## 2014-01-22 MED ORDER — FENTANYL CITRATE 0.05 MG/ML IJ SOLN
INTRAMUSCULAR | Status: AC
  Filled 2014-01-22: qty 2

## 2014-01-22 MED ORDER — BUTAMBEN-TETRACAINE-BENZOCAINE 2-2-14 % EX AERO
INHALATION_SPRAY | CUTANEOUS | Status: DC | PRN
  Administered 2014-01-22: 1 via TOPICAL

## 2014-01-22 MED ORDER — LACTATED RINGERS IV SOLN
INTRAVENOUS | Status: DC
  Administered 2014-01-22: 09:00:00 via INTRAVENOUS

## 2014-01-22 MED ORDER — FENTANYL CITRATE 0.05 MG/ML IJ SOLN
INTRAMUSCULAR | Status: DC | PRN
  Administered 2014-01-22 (×2): 50 ug via INTRAVENOUS

## 2014-01-22 MED ORDER — PROPOFOL 10 MG/ML IV BOLUS
INTRAVENOUS | Status: AC
  Filled 2014-01-22: qty 20

## 2014-01-22 MED ORDER — KETAMINE HCL 10 MG/ML IJ SOLN
INTRAMUSCULAR | Status: DC | PRN
  Administered 2014-01-22 (×2): 10 mg via INTRAVENOUS

## 2014-01-22 MED ORDER — PROPOFOL INFUSION 10 MG/ML OPTIME
INTRAVENOUS | Status: DC | PRN
  Administered 2014-01-22: 140 ug/kg/min via INTRAVENOUS

## 2014-01-22 MED ORDER — SODIUM CHLORIDE 0.9 % IV SOLN: INTRAVENOUS | Status: DC

## 2014-01-22 MED ORDER — LIDOCAINE HCL (CARDIAC) 20 MG/ML IV SOLN
INTRAVENOUS | Status: AC
  Filled 2014-01-22: qty 5

## 2014-01-22 MED ORDER — MIDAZOLAM HCL 5 MG/5ML IJ SOLN
INTRAMUSCULAR | Status: DC | PRN
  Administered 2014-01-22 (×2): 1 mg via INTRAVENOUS

## 2014-01-22 MED ORDER — MIDAZOLAM HCL 2 MG/2ML IJ SOLN
INTRAMUSCULAR | Status: AC
  Filled 2014-01-22: qty 2

## 2014-01-22 NOTE — Op Note (Signed)
[facility] [address] [place], [address]   ENDOSCOPIC ULTRASOUND PROCEDURE REPORT  PATIENT: Cynthia, [name]  MR#: [id number] BIRTHDATE: [birth year]  GENDER: Female ENDOSCOPIST: Rachael Fee, MD REFERRED BY:  Iva Boop, M.D, [facility] PROCEDURE DATE:  01/22/2014 PROCEDURE:   Upper EUS ASA CLASS:      Class II INDICATIONS:   1.  RUQ pain. MEDICATIONS: MAC sedation, administered by CRNA  DESCRIPTION OF PROCEDURE:   After the risks benefits and alternatives of the procedure were  explained, informed consent was obtained. The patient was then placed in the left, lateral, decubitus postion and IV sedation was administered. Throughout the procedure, the patients blood pressure, pulse and oxygen saturations were monitored continuously.  Under direct visualization, the Pentax Radial EUS [id number]  endoscope was introduced through the mouth  and advanced to the second portion of the duodenum .  Water was used as necessary to provide an acoustic interface.  Upon completion of the imaging, water was removed and the patient was sent to the recovery room in satisfactory condition.  Endoscopic findings: 1. There was moderate amount of liquid food in stomach. 2. UGI tract was otherwise normal  EUS findings: 1. Pancreatic parenchyma was normal throughout head, neck, body and tail of the gland.  No masses, no signs of chronic pancreatitis. 2. CBD was 6.13mm, contained no clear stones or sludge. 3. Main pancreatic duct was normal. 4. No peripancreatic, periportal adenopathy. 5. Limited views of liver, spleen, gallbladder, portal and splenic vessels were all normal.  Impression: Essentially normal upper EUS examination.   _______________________________ eSignedRachael Fee, MD 01/22/2014 9:53 AM

## 2014-01-22 NOTE — Transfer of Care (Signed)
Immediate Anesthesia Transfer of Care Note  Patient: Cynthia [name]  Procedure(s) Performed: Procedure(s): UPPER ENDOSCOPIC ULTRASOUND (EUS) LINEAR (N/A)  Patient Location: PACU and Endoscopy Unit  Anesthesia Type:MAC  Level of Consciousness: awake, alert , oriented and patient cooperative  Airway & Oxygen Therapy: Patient Spontanous Breathing and Patient connected to nasal cannula oxygen  Post-op Assessment: Report given to PACU RN, Post -op Vital signs reviewed and stable and Patient moving all extremities  Post vital signs: Reviewed and stable  Complications: No apparent anesthesia complications

## 2014-01-22 NOTE — H&P (View-Only) (Signed)
HPI :  Patient is a 43 year old female evaluated by Dr. Leone Payor September 2011 for RUQ pain and postprandial nausea. U/S was normal. HIDA normal. EGD negative. Pain eventually resolved but returned a year later. Patient saw PCP who felt pain was musculoskeletal and prescribed naproxen. Pain recurred around Feb 2013, started on Cymbalta. Patient seen in ED two weeks ago for RUQ pain and nausea. RUQ u/s negative. Patient told she had biliary colic and referred to surgeon. She saw Dr. Derrell Lolling who ordered HIDA. EF was normal. Patient referred here. Pain same as it was in 2011. She is frustrated at lack of answers. She has stressful job and family members with psychiatric problems. Patient wonders if stress could be causing her GI symptoms.   Patient has chronic headaches and has been treated with elavil and vicodin. PCP tried various NSAIDs and also Ultram for headaches but those didn't help. She has been followed by Neuro for headaches.   Patient diagnosed with narcolepsy in 2013, treated with Adderall which has led to some OCD behavior (skin picking).    Past Medical History  Diagnosis Date  . Anxiety and depression 04/03/2008  . Acne   . Hemorrhoids   . Basal cell cancer     forehead  . Narcolepsy   . Reflux     Family History  Problem Relation Age of Onset  . Prostate cancer Maternal Grandfather   . Kidney disease Sister   . Heart disease Paternal Grandfather   . Depression     History  Substance Use Topics  . Smoking status: Former Smoker -- 1.00 packs/day for 10 years    Types: Cigarettes    Quit date: 08/15/2003  . Smokeless tobacco: Never Used  . Alcohol Use: No     Comment: rare   Current Outpatient Prescriptions  Medication Sig Dispense Refill  . amphetamine-dextroamphetamine (ADDERALL) 30 MG tablet Take 1 tablet (30 mg total) by mouth 2 (two) times daily. Take second dose no later than 3 PM  60 tablet  0  . clonazePAM (KLONOPIN) 0.5 MG tablet Take 0.5 mg by mouth 2  (two) times daily as needed for anxiety.      . DULoxetine (CYMBALTA) 60 MG capsule Take 1 capsule (60 mg total) by mouth daily.  30 capsule  3  . oxyCODONE-acetaminophen (PERCOCET/ROXICET) 5-325 MG per tablet Take 1 tablet by mouth every 6 (six) hours as needed for moderate pain or severe pain.  30 tablet  0  . pantoprazole (PROTONIX) 40 MG tablet Take 1 tablet (40 mg total) by mouth daily.  30 tablet  2  . Salicylic Acid (NEUTROGENA OIL-FREE ACNE WASH EX) Apply 1 application topically 2 (two) times daily.       No current facility-administered medications for this visit.   Allergies  Allergen Reactions  . Doxycycline Hyclate Other (See Comments)     heartburn  . Meclizine Hcl Hives  . Morphine Itching    severe   Review of Systems: All systems reviewed and negative except where noted in HPI.    Nm Hepato W/eject Fract  01/08/2014   CLINICAL DATA:  Right upper quadrant pain.  Nausea and vomiting.  EXAM: NUCLEAR MEDICINE HEPATOBILIARY IMAGING WITH GALLBLADDER EF  TECHNIQUE: Sequential images of the abdomen were obtained out to 60 minutes following intravenous administration of radiopharmaceutical. After slow intravenous infusion of 1.29 micrograms Cholecystokinin, gallbladder ejection fraction was determined.  RADIOPHARMACEUTICALS:  5.0 Millicurie Tc-70m Choletec  COMPARISON:  None.  FINDINGS: The  gallbladder was visualized at 10 min. Activity is present in the bowel at 25 min. Ejection fraction at 30 min was 65.3%. At 30 min, normal ejection fraction is greater than 30%.  The patient did experience pain during CCK infusion.  IMPRESSION: Normal hepatobiliary scan with normal ejection fraction.   Electronically Signed   By: Geanie CooleyJim  Maxwell M.D.   On: 01/08/2014 09:35   [place] Abdomen Limited Ruq  12/25/2013   CLINICAL DATA:  RUQ pain  EXAM: US ABDOMEN LIMITED - RIGHT UPPER QUADRANT  COMPARISON:  None.  FINDINGS: Gallbladder:  No gallstones or wall thickening visualized, measuring 1.6 mm. No  sonographic Murphy sign noted.  Common bile duct:  Diameter: 4.5 mm  Liver:  No focal lesion identified. Within normal limits in parenchymal echogenicity.  IMPRESSION: Negative right upper quadrant abdominal ultrasound.   Electronically Signed   By: Salome HolmesHector  Cooper M.D.   On: 12/25/2013 17:11    CTscan abd/pelvis with contrast June 2014 IMPRESSION:  Heterogeneous nephrograms and both kidneys suggest pyelonephritis.  Appendix is normal. Diffusely stool filled colon.    Physical Exam: BP 92/64  Pulse 96  Ht 5' 1.75" (1.568 m)  Wt 146 lb (66.225 kg)  BMI 26.94 kg/m2 Constitutional: Pleasant,well-developed, white female in no acute distress. HEENT: Normocephalic and atraumatic. Conjunctivae are normal. No scleral icterus. Neck supple.  Cardiovascular: Normal rate, regular rhythm.  Pulmonary/chest: Effort normal and breath sounds normal. No wheezing, rales or rhonchi. Abdominal: Soft, nondistended, nontender. Bowel sounds active throughout. There are no masses palpable. No hepatomegaly. Extremities: no edema Lymphadenopathy: No cervical adenopathy noted. Neurological: Alert and oriented to person place and time. Skin: Skin is warm and dry. No rashes noted. Psychiatric: Normal mood and affect. Behavior is normal.   ASSESSMENT AND PLAN:  571. 43 year old female with chronic, intermittent RUQ pain radiating around to right back / nausea. Extensive workup through the years has been negative. She had a surgical evaluation for gallbladder disease earlier this month. HIDA negative, no plans for cholecystectomy. Offered referral to tertiary care center for further evaluation. We also discussed EUS for further evaluation (r/o small stones or other biliary abnormalities). At this point she would like to pursue EUS. She requested refill on pain medication which I did refill. If EUS negative then doubtful we will have much else to offer her.    2. Anxiety / depression / narcolepsy   3.  ? Hx of  pyleonephritis (June 2014 CTscan)

## 2014-01-22 NOTE — Anesthesia Preprocedure Evaluation (Addendum)
Anesthesia Evaluation  Patient identified by MRN, date of birth, ID band Patient awake    Reviewed: Allergy & Precautions, H&P , NPO status , Patient's Chart, lab work & pertinent test results  History of Anesthesia Complications (+) PONV  Airway Mallampati: II TM Distance: >3 FB Neck ROM: Full    Dental  (+) Teeth Intact, Dental Advisory Given   Pulmonary neg pulmonary ROS, former smoker,  breath sounds clear to auscultation  Pulmonary exam normal       Cardiovascular negative cardio ROS  Rhythm:Regular Rate:Normal     Neuro/Psych negative neurological ROS  negative psych ROS   GI/Hepatic negative GI ROS, Neg liver ROS, GERD-  ,  Endo/Other  negative endocrine ROS  Renal/GU negative Renal ROS  negative genitourinary   Musculoskeletal negative musculoskeletal ROS (+)   Abdominal   Peds  Hematology negative hematology ROS (+)   Anesthesia Other Findings   Reproductive/Obstetrics negative OB ROS                          Anesthesia Physical Anesthesia Plan  ASA: II  Anesthesia Plan: MAC   Post-op Pain Management:    Induction: Intravenous  Airway Management Planned: Nasal Cannula  Additional Equipment:   Intra-op Plan:   Post-operative Plan:   Informed Consent: I have reviewed the patients History and Physical, chart, labs and discussed the procedure including the risks, benefits and alternatives for the proposed anesthesia with the patient or authorized representative who has indicated his/her understanding and acceptance.   Dental advisory given  Plan Discussed with: CRNA  Anesthesia Plan Comments:         Anesthesia Quick Evaluation

## 2014-01-22 NOTE — Anesthesia Postprocedure Evaluation (Signed)
Anesthesia Post Note  Patient: Cynthia [name]  Procedure(s) Performed: Procedure(s) (LRB): UPPER ENDOSCOPIC ULTRASOUND (EUS) LINEAR (N/A)  Anesthesia type: MAC  Patient location: PACU  Post pain: Pain level controlled  Post assessment: Post-op Vital signs reviewed  Last Vitals:  Filed Vitals:   01/22/14 1006  BP: 103/62  Pulse: 70  Temp:   Resp: 14    Post vital signs: Reviewed  Level of consciousness: sedated  Complications: No apparent anesthesia complications

## 2014-01-22 NOTE — Discharge Instructions (Signed)

## 2014-01-22 NOTE — Interval H&P Note (Signed)
History and Physical Interval Note:  01/22/2014 9:13 AM  Cynthia [name]  has presented today for surgery, with the diagnosis of RUQ pain [789.01]  The various methods of treatment have been discussed with the patient and family. After consideration of risks, benefits and other options for treatment, the patient has consented to  Procedure(s): UPPER ENDOSCOPIC ULTRASOUND (EUS) LINEAR (N/A) as a surgical intervention .  The patient's history has been reviewed, patient examined, no change in status, stable for surgery.  I have reviewed the patient's chart and labs.  Questions were answered to the patient's satisfaction.     Rachael Fee

## 2014-01-23 ENCOUNTER — Encounter (HOSPITAL_COMMUNITY): Payer: Self-pay | Admitting: Gastroenterology

## 2014-01-26 ENCOUNTER — Telehealth: Payer: Self-pay | Admitting: Neurology

## 2014-01-26 NOTE — Telephone Encounter (Signed)
Medication question.  Please advise. 

## 2014-01-26 NOTE — Telephone Encounter (Signed)
I called back.  [facility] with [facility].  States they will fill and ship the Rx as prescribed.  Will call us back if anything further is needed.

## 2014-01-26 NOTE — Telephone Encounter (Signed)
Kayla Pharmacist at [facility] [phone] option [phone], calling and stated patient updated med list which included oxyCODONE-acetaminophen (PERCOCET/ROXICET) 5-325 MG per tablet.  Wanted to make Dr Frances FurbishAthar aware and also need authorization for shipping.  Please call and advise

## 2014-01-30 ENCOUNTER — Telehealth: Payer: Self-pay

## 2014-01-30 NOTE — Telephone Encounter (Signed)
Left message for patient to call back  

## 2014-01-30 NOTE — Telephone Encounter (Signed)
Message copied by Annett FabianJONES, SHERI L on Fri Jan 30, 2014  9:17 AM ------      Message from: Stan HeadGESSNER, CARL E      Created: Thu Jan 29, 2014  8:43 PM       Please let her know I am aware of EUS results and unfortunately cannot find a cause of her pain.            I could see her again in the office if she wants to review things but do not have other treatment or diagnostic recommendations at this time.                  ----- Message -----         From: Rachael Feeaniel P Jacobs, MD         Sent: 01/22/2014  10:01 AM           To: Iva Booparl E Gessner, MD            EUS was essentially normal today.  CBD was 6.143mm but since symptoms are not very consistent and the duct was only sightly dilated,  I didn't discuss ? SOD with her.         ------

## 2014-02-02 NOTE — Telephone Encounter (Signed)
Left message for patient to call back  

## 2014-02-04 NOTE — Telephone Encounter (Signed)
I have left the patient another message.  I also sent a my chart message with Dr. Marvell FullerGessner's message

## 2014-02-05 ENCOUNTER — Other Ambulatory Visit: Payer: Self-pay | Admitting: Family

## 2014-02-12 ENCOUNTER — Other Ambulatory Visit: Payer: Self-pay | Admitting: Neurology

## 2014-02-12 DIAGNOSIS — G47411 Narcolepsy with cataplexy: Secondary | ICD-10-CM

## 2014-02-12 MED ORDER — AMPHETAMINE-DEXTROAMPHETAMINE 30 MG PO TABS: 30.0000 mg | ORAL_TABLET | Freq: Two times a day (BID) | ORAL | Status: DC

## 2014-02-12 NOTE — Telephone Encounter (Signed)
Patient requesting Rx refill for amphetamine-dextroamphetamine (ADDERALL) 30 MG tablet.  Please call and advise.

## 2014-02-12 NOTE — Telephone Encounter (Signed)
Patient was last seen in May, has a follow up appt in Sept.   Request forwarded to provider for approval.

## 2014-02-16 NOTE — Telephone Encounter (Signed)
Called pt to inform her that her Rx was ready to be picked up at the front desk and if she has any other problems, questions or concerns to call the office. Pt verbalized understanding. °

## 2014-03-02 ENCOUNTER — Ambulatory Visit: Payer: Self-pay | Admitting: Family

## 2014-03-03 ENCOUNTER — Encounter: Payer: Self-pay | Admitting: Family

## 2014-03-03 ENCOUNTER — Ambulatory Visit (INDEPENDENT_AMBULATORY_CARE_PROVIDER_SITE_OTHER): Payer: 59 | Admitting: Family

## 2014-03-03 VITALS — BP 104/64 | HR 83 | Wt 157.0 lb

## 2014-03-03 DIAGNOSIS — F424 Excoriation (skin-picking) disorder: Secondary | ICD-10-CM

## 2014-03-03 DIAGNOSIS — F411 Generalized anxiety disorder: Secondary | ICD-10-CM

## 2014-03-03 DIAGNOSIS — G47419 Narcolepsy without cataplexy: Secondary | ICD-10-CM

## 2014-03-03 DIAGNOSIS — L981 Factitial dermatitis: Secondary | ICD-10-CM

## 2014-03-03 DIAGNOSIS — K2100 Gastro-esophageal reflux disease with esophagitis, without bleeding: Secondary | ICD-10-CM

## 2014-03-03 MED ORDER — DIAZEPAM 5 MG PO TABS: 5.0000 mg | ORAL_TABLET | Freq: Two times a day (BID) | ORAL | Status: DC | PRN

## 2014-03-03 MED ORDER — DULOXETINE HCL 30 MG PO CPEP: 90.0000 mg | ORAL_CAPSULE | Freq: Every day | ORAL | Status: DC

## 2014-03-03 NOTE — Progress Notes (Signed)
Pre visit review using our clinic review tool, if applicable. No additional management support is needed unless otherwise documented below in the visit note. 

## 2014-03-03 NOTE — Progress Notes (Signed)
Subjective:    Patient ID: Cynthia [name], female    DOB: 05-08-1971, 43 y.o.   MRN: [id number]  HPI 43 year old female, nonsmoker, with a history of anxiety and narcolepsy is in today for recheck of anxiety. Has a history of picking disorder he continues to pick. She's currently taking Cymbalta 60 mg once daily and Klonopin 0.5 mg twice a day. She has sought out therapy that was not effective. However, she would like to consider another therapist. She has a family history of bipolar disorder in her father. Her six-year-old daughter was recently diagnosed with bipolar disorder as well. Reports working extended hours at work. She is seeking new employement.     Review of Systems  Constitutional: Negative.   Respiratory: Negative.   Cardiovascular: Negative.   Endocrine: Negative.   Musculoskeletal: Negative.   Skin: Positive for wound.       Picking on chest and lower abdomen  Neurological: Negative.   Psychiatric/Behavioral: Positive for agitation. The patient is nervous/anxious.    Past Medical History  Diagnosis Date  . Anxiety and depression 04/03/2008  . Acne   . Hemorrhoids   . Narcolepsy   . Reflux   . Basal cell cancer may 2014    forehead basal cell  . Narcolepsy   . Anxiety   . GERD (gastroesophageal reflux disease)   . Headache(784.0)     hx migraines, none recent  . Elevated lipase last Jan 02, 2014    x 2  . Complication of anesthesia   . PONV (postoperative nausea and vomiting)     nausea on occasional, dilaudid post op best for post op pain    History   Social History  . Marital Status: Married    Spouse Name: [name]    Number of Children: 2  . 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 Smoking status: Former Smoker -- 1.00 packs/day for 10 years    Types: Cigarettes    Quit date: 08/15/2003  . Smokeless tobacco: Never Used  . Alcohol Use: No     Comment: rare  . Drug Use: No  .  Sexual Activity: Not on file   Other Topics Concern  . Not on file   Social History Narrative   Pt lives at home with husband [name] and two children   Pt is right handed    Education - Bachelors degree   Caffeine 1-2 cups a day    Past Surgical History  Procedure Laterality Date  . Partial hysterectomy    . Bunionectomy    . Cesarean section      x 2  . Skin cancer excision    . Esophagogastroduodenoscopy  2011    Dr. Jola Babinski   . Surgery for endomtriosis  yrs ago  . Eus N/A 01/22/2014    Procedure: UPPER ENDOSCOPIC ULTRASOUND (EUS) LINEAR;  Surgeon: Rachael Fee, MD;  Location: [facility];  Service: Endoscopy;  Laterality: N/A;    Family History  Problem Relation Age of Onset  . Prostate cancer Maternal Grandfather   . Kidney disease Sister   . Heart disease Paternal Grandfather   . Depression      Allergies  Allergen Reactions  . Doxycycline Hyclate Other (See Comments)     heartburn  . Meclizine Hcl Hives  . Morphine Itching    severe    Current Outpatient Prescriptions on File Prior to Visit  Medication Sig Dispense Refill  . amphetamine-dextroamphetamine (ADDERALL) 30 MG tablet Take 1 tablet (30 mg total) by mouth 2 (two) times daily. Take second dose no later than 3 PM  60 tablet  0  . pantoprazole (PROTONIX) 40 MG tablet Take 1 tablet (40 mg total) by mouth daily.  30 tablet  2  . Salicylic Acid (NEUTROGENA OIL-FREE ACNE WASH EX) Apply 1 application topically 2 (two) times daily.       No current facility-administered medications on file prior to visit.    BP 104/64  Pulse 83  Wt 157 lb (71.215 kg)chart     Objective:   Physical Exam  Constitutional: She is oriented to person, place, and time. She appears well-developed and well-nourished.  Neck: Normal range of motion. Neck supple.  Cardiovascular: Normal rate, regular rhythm and normal heart sounds.   Pulmonary/Chest: Effort normal and breath sounds normal.    Musculoskeletal: Normal range of motion.  Neurological: She is alert and oriented to person, place, and time.  Skin: Skin is warm and dry.  Multiple scabs noted to the anterior chest wall, various stages. No signs of infection.   Psychiatric: She has a normal mood and affect.          Assessment & Plan:  Herbert SetaHeather was seen today for anxiety.  Diagnoses and associated orders for this visit:  Narcolepsy  Gastroesophageal reflux disease with esophagitis  Skin-picking disorder  Generalized anxiety disorder  Other Orders - DULoxetine (CYMBALTA) 30 MG capsule; Take 3 capsules (90 mg total) by mouth daily. - diazepam (VALIUM) 5 MG tablet; Take 1 tablet (5 mg total) by mouth every 12 (twelve) hours as needed for anxiety.   Call the office with any questions or concerns. Recheck in 4 weeks and sooner as needed. See a therapist. Consider psychiatry.

## 2014-03-03 NOTE — Patient Instructions (Signed)

## 2014-03-13 ENCOUNTER — Other Ambulatory Visit: Payer: Self-pay | Admitting: Neurology

## 2014-03-13 DIAGNOSIS — G47411 Narcolepsy with cataplexy: Secondary | ICD-10-CM

## 2014-03-13 MED ORDER — AMPHETAMINE-DEXTROAMPHETAMINE 30 MG PO TABS: 30.0000 mg | ORAL_TABLET | Freq: Two times a day (BID) | ORAL | Status: DC

## 2014-03-13 NOTE — Telephone Encounter (Signed)
Called pt to inform her that her Rx was ready to be picked up at the front desk and if she has any other problems, questions or concerns to call the office. Pt verbalized understanding. °

## 2014-03-13 NOTE — Telephone Encounter (Signed)
Dr Athar is out of the office.  Forwarding request to [facility] for approval.   

## 2014-03-13 NOTE — Telephone Encounter (Signed)
Patient requesting refill of Adderall script to pick up, please call and advise.

## 2014-03-23 ENCOUNTER — Ambulatory Visit: Payer: Self-pay | Admitting: Family

## 2014-03-26 ENCOUNTER — Ambulatory Visit: Payer: Self-pay | Admitting: Family

## 2014-03-30 ENCOUNTER — Encounter: Payer: Self-pay | Admitting: Internal Medicine

## 2014-04-15 ENCOUNTER — Other Ambulatory Visit: Payer: Self-pay | Admitting: Neurology

## 2014-04-15 DIAGNOSIS — G47411 Narcolepsy with cataplexy: Secondary | ICD-10-CM

## 2014-04-15 MED ORDER — AMPHETAMINE-DEXTROAMPHETAMINE 30 MG PO TABS: 30.0000 mg | ORAL_TABLET | Freq: Two times a day (BID) | ORAL | Status: DC

## 2014-04-15 NOTE — Telephone Encounter (Signed)
Rx entered and forwarded to provider for approval 

## 2014-04-15 NOTE — Telephone Encounter (Signed)
Called pt and left message informing her that her Rx was ready to be picked up at the front desk and if she has any other problems, questions or concerns to call the office.  °

## 2014-04-15 NOTE — Telephone Encounter (Signed)
Patient requesting Rx refill for amphetamine-dextroamphetamine (ADDERALL) 30 MG tablet.  Please call anytime when ready for pick up and may leave detailed message if not available.

## 2014-04-30 ENCOUNTER — Encounter: Payer: Self-pay | Admitting: Neurology

## 2014-04-30 ENCOUNTER — Ambulatory Visit (INDEPENDENT_AMBULATORY_CARE_PROVIDER_SITE_OTHER): Payer: 59 | Admitting: Neurology

## 2014-04-30 VITALS — BP 111/68 | HR 95 | Temp 98.2°F | Ht 60.5 in | Wt 148.0 lb

## 2014-04-30 DIAGNOSIS — F3289 Other specified depressive episodes: Secondary | ICD-10-CM

## 2014-04-30 DIAGNOSIS — F329 Major depressive disorder, single episode, unspecified: Secondary | ICD-10-CM

## 2014-04-30 DIAGNOSIS — F32A Depression, unspecified: Secondary | ICD-10-CM

## 2014-04-30 DIAGNOSIS — F411 Generalized anxiety disorder: Secondary | ICD-10-CM

## 2014-04-30 DIAGNOSIS — G47411 Narcolepsy with cataplexy: Secondary | ICD-10-CM

## 2014-04-30 NOTE — Patient Instructions (Signed)
We will restart Xyrem and titrate as before to 9 g each night.  Use an over the counter splint for your right wrist for carpal tunnel.  Call for your Adderall refill end of this month.  Follow up in 3 months.
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
## 2014-05-05 ENCOUNTER — Encounter: Payer: Self-pay | Admitting: Family

## 2014-05-05 ENCOUNTER — Ambulatory Visit (INDEPENDENT_AMBULATORY_CARE_PROVIDER_SITE_OTHER): Payer: 59 | Admitting: Family

## 2014-05-05 VITALS — BP 118/70 | HR 89 | Wt 152.0 lb

## 2014-05-05 DIAGNOSIS — G5600 Carpal tunnel syndrome, unspecified upper limb: Secondary | ICD-10-CM

## 2014-05-05 DIAGNOSIS — F411 Generalized anxiety disorder: Secondary | ICD-10-CM

## 2014-05-05 DIAGNOSIS — G5603 Carpal tunnel syndrome, bilateral upper limbs: Secondary | ICD-10-CM

## 2014-05-05 MED ORDER — MELOXICAM 15 MG PO TABS: 15.0000 mg | ORAL_TABLET | Freq: Every day | ORAL | Status: DC

## 2014-05-05 MED ORDER — METHYLPREDNISOLONE 4 MG PO KIT: PACK | ORAL | Status: AC

## 2014-05-05 MED ORDER — CLONAZEPAM 0.5 MG PO TABS: 0.5000 mg | ORAL_TABLET | Freq: Three times a day (TID) | ORAL | Status: DC | PRN

## 2014-05-05 NOTE — Progress Notes (Signed)
Pre visit review using our clinic review tool, if applicable. No additional management support is needed unless otherwise documented below in the visit note. 

## 2014-05-05 NOTE — Patient Instructions (Signed)
Carpal Tunnel Syndrome The carpal tunnel is a narrow area located on the palm side of your wrist. The tunnel is formed by the wrist bones and ligaments. Nerves, blood vessels, and tendons pass through the carpal tunnel. Repeated wrist motion or certain diseases may cause swelling within the tunnel. This swelling pinches the main nerve in the wrist (median nerve) and causes the painful hand and arm condition called carpal tunnel syndrome. CAUSES   Repeated wrist motions.  Wrist injuries.  Certain diseases like arthritis, diabetes, alcoholism, hyperthyroidism, and kidney failure.  Obesity.  Pregnancy. SYMPTOMS   A "pins and needles" feeling in your fingers or hand, especially in your thumb, index and middle fingers.  Tingling or numbness in your fingers or hand.  An aching feeling in your entire arm, especially when your wrist and elbow are bent for long periods of time.  Wrist pain that goes up your arm to your shoulder.  Pain that goes down into your palm or fingers.  A weak feeling in your hands. DIAGNOSIS  Your health care provider will take your history and perform a physical exam. An electromyography test may be needed. This test measures electrical signals sent out by your nerves into the muscles. The electrical signals are usually slowed by carpal tunnel syndrome. You may also need X-rays. TREATMENT  Carpal tunnel syndrome may clear up by itself. Your health care provider may recommend a wrist splint or medicine such as a nonsteroidal anti-inflammatory medicine. Cortisone injections may help. Sometimes, surgery may be needed to free the pinched nerve.  HOME CARE INSTRUCTIONS   Take all medicine as directed by your health care provider. Only take over-the-counter or prescription medicines for pain, discomfort, or fever as directed by your health care provider.  If you were given a splint to keep your wrist from bending, wear it as directed. It is important to wear the splint at  night. Wear the splint for as long as you have pain or numbness in your hand, arm, or wrist. This may take 1 to 2 months.  Rest your wrist from any activity that may be causing your pain. If your symptoms are work-related, you may need to talk to your employer about changing to a job that does not require using your wrist.  Put ice on your wrist after long periods of wrist activity.  Put ice in a plastic bag.  Place a towel between your skin and the bag.  Leave the ice on for 15-20 minutes, 03-04 times a day.  Keep all follow-up visits as directed by your health care provider. This includes any orthopedic referrals, physical therapy, and rehabilitation. Any delay in getting necessary care could result in a delay or failure of your condition to heal. SEEK IMMEDIATE MEDICAL CARE IF:   You have new, unexplained symptoms.  Your symptoms get worse and are not helped or controlled with medicines. MAKE SURE YOU:   Understand these instructions.  Will watch your condition.  Will get help right away if you are not doing well or get worse. Document Released: 07/28/2000 Document Revised: 12/15/2013 Document Reviewed: 06/16/2011 ExitCare Patient Information 2015 [employer]. This information is not intended to replace advice given to you by your health care provider. Make sure you discuss any questions you have with your health care provider.  

## 2014-05-05 NOTE — Progress Notes (Signed)
Subjective:    Patient ID: Cynthia [name], female    DOB: 1971/02/20, 43 y.o.   MRN: [id number]  HPI 43 year old nonsmoker with a history of anxiety and then today with complaints of bilateral numbness and pain particularly in her right hand that's worsened over the last several months but ongoing x1 year. She is left-hand dominant. Reports working on a keyboard all day. Pain is worse with typing. Pain is a 7/10. This muscle relaxants without much relief. Left hand and arm is worse with pushing. Right hand is worse at bedtime sleeping.  Would like to have nerve conduction testing done and potentially be scheduled for carpal tunnel release   Review of Systems  Constitutional: Negative.   HENT: Negative.   Respiratory: Negative.   Cardiovascular: Negative.   Gastrointestinal: Negative.   Endocrine: Negative.   Genitourinary: Negative.   Musculoskeletal: Negative.   Allergic/Immunologic: Negative.   Neurological: Negative.   Hematological: Negative.   Psychiatric/Behavioral: Negative.    Past Medical History  Diagnosis Date  . Anxiety and depression 04/03/2008  . Acne   . Hemorrhoids   . Narcolepsy   . Reflux   . Basal cell cancer may 2014    forehead basal cell  . Narcolepsy   . Anxiety   . GERD (gastroesophageal reflux disease)   . Headache(784.0)     hx migraines, none recent  . Elevated lipase last Jan 02, 2014    x 2  . Complication of anesthesia   . PONV (postoperative nausea and vomiting)     nausea on occasional, dilaudid post op best for post op pain    History   Social History  . Marital Status: Married    Spouse Name: Cynthia [name]    Number of Children: 2  . 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 Smoking status: Former Smoker -- 1.00 packs/day for 10 years    Types: Cigarettes    Quit date: 08/15/2003  . Smokeless tobacco: Never Used  . Alcohol Use: No     Comment: rare  . Drug Use: No  .  Sexual Activity: Not on file   Other Topics Concern  . Not on file   Social History Narrative   Pt lives at home with husband Cynthia [name]) and two children   Pt is right handed    Education - Bachelors degree   Caffeine 1-2 cups a day    Past Surgical History  Procedure Laterality Date  . Partial hysterectomy    . Bunionectomy    . Cesarean section      x 2  . Skin cancer excision    . Esophagogastroduodenoscopy  2011    Dr. Jola [name]   . Surgery for endomtriosis  yrs ago  . Eus N/A 01/22/2014    Procedure: UPPER ENDOSCOPIC ULTRASOUND (EUS) LINEAR;  Surgeon: Cynthia Fee, MD;  Location: [facility];  Service: Endoscopy;  Laterality: N/A;    Family History  Problem Relation Age of Onset  . Prostate cancer Maternal Grandfather   . Kidney disease Sister   . Heart disease Paternal Grandfather   . Depression      Allergies  Allergen Reactions  . Doxycycline Hyclate Other (See Comments)     heartburn  . Meclizine Hcl Hives  . Morphine Itching    severe    Current Outpatient Prescriptions on File Prior to Visit  Medication Sig Dispense Refill  .  amphetamine-dextroamphetamine (ADDERALL) 30 MG tablet Take 1 tablet by mouth 2 (two) times daily. Take second dose no later than 3 PM  60 tablet  0  . DULoxetine (CYMBALTA) 30 MG capsule Take 3 capsules (90 mg total) by mouth daily.  90 capsule  3  . pantoprazole (PROTONIX) 40 MG tablet Take 1 tablet (40 mg total) by mouth daily.  30 tablet  2  . Salicylic Acid (NEUTROGENA OIL-FREE ACNE WASH EX) Apply 1 application topically 2 (two) times daily.      Cynthia [name] 500 MG/ML SOLN        No current facility-administered medications on file prior to visit.    BP 118/70  Pulse 89  Wt 152 lb (68.947 kg)chart    Objective:   Physical Exam  Constitutional: She is oriented to person, place, and time. She appears well-developed and well-nourished.  HENT:  Right Ear: External ear normal.  Left Ear: External ear normal.    Nose: Nose normal.  Mouth/Throat: Oropharynx is clear and moist.  Neck: Normal range of motion. Neck supple. No thyromegaly present.  Cardiovascular: Normal rate, regular rhythm and normal heart sounds.   Pulmonary/Chest: Effort normal and breath sounds normal.  Abdominal: Soft. Bowel sounds are normal.  Musculoskeletal: Normal range of motion.  Neurological: She is alert and oriented to person, place, and time.  Skin: Skin is warm and dry.  Psychiatric: She has a normal mood and affect.          Assessment & Plan:  Cynthia [name] was seen today for left elbow pain.  Diagnoses and associated orders for this visit:  Bilateral carpal tunnel syndrome - Nerve conduction test; Future  Generalized anxiety disorder  Other Orders - methylPREDNISolone (MEDROL DOSEPAK) 4 MG tablet; follow package directions - meloxicam (MOBIC) 15 MG tablet; Take 1 tablet (15 mg total) by mouth daily. - clonazePAM (KLONOPIN) 0.5 MG tablet; Take 1 tablet (0.5 mg total) by mouth 3 (three) times daily as needed for anxiety.     Advise bilateral splints. Medrol Dosepak as directed. Thereafter, start Mobic 15 mg once daily with food. Referral done for nerve conduction testing. Followup pending results.

## 2014-05-14 ENCOUNTER — Other Ambulatory Visit: Payer: Self-pay | Admitting: Neurology

## 2014-05-14 DIAGNOSIS — G47411 Narcolepsy with cataplexy: Secondary | ICD-10-CM

## 2014-05-14 MED ORDER — AMPHETAMINE-DEXTROAMPHETAMINE 30 MG PO TABS: 30.0000 mg | ORAL_TABLET | Freq: Two times a day (BID) | ORAL | Status: DC

## 2014-05-14 NOTE — Telephone Encounter (Signed)
Patient requesting refill of Adderall, please call when ready for pick up.  °

## 2014-05-14 NOTE — Telephone Encounter (Signed)
Request forwarded to provider for approval  

## 2014-05-14 NOTE — Telephone Encounter (Signed)
Called pt to inform her that her Rx was ready to be picked up at the front desk and if she has any other problems, questions or concerns to call the office. Pt verbalized understanding. °

## 2014-05-21 ENCOUNTER — Telehealth: Payer: Self-pay | Admitting: Neurology

## 2014-05-21 NOTE — Telephone Encounter (Signed)
Pt moved her appt to 06-04-14 from 06-25-14  Pt is aware of the change

## 2014-05-29 ENCOUNTER — Other Ambulatory Visit: Payer: Self-pay

## 2014-06-04 ENCOUNTER — Ambulatory Visit (INDEPENDENT_AMBULATORY_CARE_PROVIDER_SITE_OTHER): Payer: 59 | Admitting: Neurology

## 2014-06-04 DIAGNOSIS — G5603 Carpal tunnel syndrome, bilateral upper limbs: Secondary | ICD-10-CM

## 2014-06-04 DIAGNOSIS — G5601 Carpal tunnel syndrome, right upper limb: Secondary | ICD-10-CM

## 2014-06-04 DIAGNOSIS — G5602 Carpal tunnel syndrome, left upper limb: Secondary | ICD-10-CM

## 2014-06-04 NOTE — Procedures (Signed)
[facility]  [address], [address]  [place], [place] [address] Tel: [phone] Fax:  [phone] Test Date:  06/04/2014  Patient: Cynthia RadonHeather [name] DOB: 11/09/1970 Physician: Nita Sickleonika Saul Dorsi, DO  Sex: Female Height: 5' " Ref Phys: Adline MangoCampbell, Padonda  ID#: [id number] Temp: 35.0C Technician: Ala BentSusan Reid R. NCS T.   Patient Complaints: Patient is a 43 year old female here for evaluation of numbness in bilateral right greater than left hands.  NCV & EMG Findings: Extensive electrodiagnostic testing of the right upper extremity and additional studies of the left reveals:  1. Right median sensory response is prolonged and reduced in amplitude. The left median sensory response and palmar studies are within normal limits. Bilateral radial and ulnar sensory responses are within normal limits. 2. The right median motor response is low-normal with preserved latency.  Left median and bilateral ulnar motor responses are within normal limits. 3. There is no evidence of active or chronic motor axon loss changes affecting any of the tested muscles. Motor unit configuration and recruitment pattern is within normal limits.  Impression: 1. Right median neuropathy at or distal to the wrist, consistent with the clinical diagnosis of carpal tunnel syndrome. Overall, these findings are moderate in degree electrically. 2. Electrodiagnostic testing of the left upper extremity is normal. In particular, there is no evidence of carpal tunnel syndrome or cervical radiculopathy affecting the left side.  ___________________________ Nita Sickleonika Reka Wist, DO    Nerve Conduction Studies Anti Sensory Summary Table   Site NR Peak (ms) Norm Peak (ms) P-T Amp (V) Norm P-T Amp  Left Median Anti Sensory (2nd Digit)  35C  Wrist    2.9 <3.4 39.8 >20  Right Median Anti Sensory (2nd Digit)  34C  Wrist    3.7 <3.4 10.1 >20  Left Radial Anti Sensory (Base 1st Digit)  35C  Wrist    2.1 <2.7 25.9 >18  Right Radial Anti  Sensory (Base 1st Digit)  34C  Wrist    2.0 <2.7 30.4 >18  Left Ulnar Anti Sensory (5th Digit)  35C  Wrist    2.5 <3.1 20.6 >12  Right Ulnar Anti Sensory (5th Digit)  34C  Wrist    2.3 <3.1 22.7 >12   Motor Summary Table   Site NR Onset (ms) Norm Onset (ms) O-P Amp (mV) Norm O-P Amp Site1 Site2 Delta-0 (ms) Dist (cm) Vel (m/s) Norm Vel (m/s)  Left Median Motor (Abd Poll Brev)  35C  Wrist    3.1 <3.9 7.2 >6 Elbow Wrist 4.2 24.5 58 >50  Elbow    7.3  6.8         Right Median Motor (Abd Poll Brev)  34C  Wrist    3.6 <3.9 6.2 >6 Elbow Wrist 4.4 24.0 55 >50  Elbow    8.0  6.2         Left Ulnar Motor (Abd Dig Minimi)  35C  Wrist    2.3 <3.1 7.6 >7 B Elbow Wrist 2.7 20.0 74 >50  B Elbow    5.0  7.0  A Elbow B Elbow 1.4 10.0 71 >50  A Elbow    6.4  6.9         Right Ulnar Motor (Abd Dig Minimi)  34C  Wrist    2.7 <3.1 8.5 >7 B Elbow Wrist 3.2 18.0 56 >50  B Elbow    5.9  8.4  A Elbow B Elbow 1.5 10.0 67 >50  A Elbow    7.4  8.2          Comparison Summary Table   Site NR Peak (ms) Norm Peak (ms) P-T Amp (V) Site1 Site2 Delta-P (ms) Norm Delta (ms)  Left Median/Ulnar Palm Comparison (Wrist - 8cm)  35C  Median Palm    1.9 <2.2 68.6 Median Palm Ulnar Palm 0.3   Ulnar Palm    1.6 <2.2 23.3       EMG   Side Muscle Ins Act Fibs Psw Fasc Number Recrt Dur Dur. Amp Amp. Poly Poly. Comment  Left 1stDorInt Nml Nml Nml Nml Nml Nml Nml Nml Nml Nml Nml Nml N/A  Left Ext Indicis Nml Nml Nml Nml Nml Nml Nml Nml Nml Nml Nml Nml N/A  Left PronatorTeres Nml Nml Nml Nml Nml Nml Nml Nml Nml Nml Nml Nml N/A  Left Biceps Nml Nml Nml Nml Nml Nml Nml Nml Nml Nml Nml Nml N/A  Left Triceps Nml Nml Nml Nml Nml Nml Nml Nml Nml Nml Nml Nml N/A  Left Deltoid Nml Nml Nml Nml Nml Nml Nml Nml Nml Nml Nml Nml N/A  Right 1stDorInt Nml Nml Nml Nml Nml Nml Nml Nml Nml Nml Nml Nml N/A  Right FlexPolLong Nml Nml Nml Nml Nml Nml Nml Nml Nml Nml Nml Nml N/A  Right PronatorTeres Nml Nml Nml Nml Nml Nml Nml Nml Nml  Nml Nml Nml N/A  Right Biceps Nml Nml Nml Nml Nml Nml Nml Nml Nml Nml Nml Nml N/A  Right Triceps Nml Nml Nml Nml Nml Nml Nml Nml Nml Nml Nml Nml N/A  Right Deltoid Nml Nml Nml Nml Nml Nml Nml Nml Nml Nml Nml Nml N/A  Right Abd Poll Brev Nml Nml Nml Nml Nml Nml Nml Nml Nml Nml Nml Nml N/A      Waveforms:

## 2014-06-09 ENCOUNTER — Other Ambulatory Visit: Payer: Self-pay | Admitting: Neurology

## 2014-06-09 DIAGNOSIS — G47411 Narcolepsy with cataplexy: Secondary | ICD-10-CM

## 2014-06-09 MED ORDER — AMPHETAMINE-DEXTROAMPHETAMINE 30 MG PO TABS: 30.0000 mg | ORAL_TABLET | Freq: Two times a day (BID) | ORAL | Status: DC

## 2014-06-09 NOTE — Telephone Encounter (Signed)
Patient requesting refill of Adderall, please call when ready for pick up.

## 2014-06-10 NOTE — Telephone Encounter (Signed)
I called the patient to let them know their Rx for Adderall was ready for pickup. Patient was instructed to bring Photo ID. 

## 2014-06-15 ENCOUNTER — Encounter: Payer: Self-pay | Admitting: Family

## 2014-06-17 ENCOUNTER — Encounter: Payer: Self-pay | Admitting: Family

## 2014-06-18 ENCOUNTER — Other Ambulatory Visit: Payer: Self-pay | Admitting: Family

## 2014-06-18 DIAGNOSIS — G5600 Carpal tunnel syndrome, unspecified upper limb: Secondary | ICD-10-CM

## 2014-06-19 ENCOUNTER — Telehealth: Payer: Self-pay | Admitting: Family

## 2014-06-19 NOTE — Telephone Encounter (Signed)
Pt was dx with carpal tunnel on 06-04-14 with dr patel and needs pain med. Pt types 8 hrs a day.[facility] [address]

## 2014-06-19 NOTE — Telephone Encounter (Signed)
Please advise 

## 2014-06-22 MED ORDER — PREDNISONE 20 MG PO TABS: ORAL_TABLET | ORAL | Status: AC

## 2014-06-22 NOTE — Telephone Encounter (Signed)
Left message on personally identified voicemail to advise pt 

## 2014-06-22 NOTE — Telephone Encounter (Signed)
I am sending over prednisone. However, is she doesn't rest her hands, she will continue to have pain. Potentially worsening

## 2014-06-25 ENCOUNTER — Encounter: Payer: Self-pay | Admitting: Neurology

## 2014-06-26 ENCOUNTER — Telehealth: Payer: Self-pay | Admitting: Neurology

## 2014-06-26 NOTE — Telephone Encounter (Signed)
Patient returning call to our office, states that she received a voicemail but they could not understand what the person was saying, please return call and advise.

## 2014-06-26 NOTE — Telephone Encounter (Signed)
Called patient and informed that from our epic notes, noone from our office has contacted her today, she verbalized understanding and said that the message was so low she could not understand.

## 2014-06-30 ENCOUNTER — Telehealth: Payer: Self-pay | Admitting: Nurse Practitioner

## 2014-06-30 ENCOUNTER — Ambulatory Visit: Payer: 59 | Admitting: Nurse Practitioner

## 2014-06-30 NOTE — Telephone Encounter (Signed)
Patient was no show for today's office appointment. Less than 24 hour cancellation. Previous no show on 11/07/13 with Dr. Frances FurbishAthar. Previous no show on 07/14/13 with Dr. Frances FurbishAthar.

## 2014-07-01 ENCOUNTER — Encounter: Payer: Self-pay | Admitting: *Deleted

## 2014-07-01 ENCOUNTER — Encounter: Payer: Self-pay | Admitting: Neurology

## 2014-07-01 NOTE — Telephone Encounter (Signed)
pls schedule with Darrol Angelarolyn [name] for FU.

## 2014-07-03 ENCOUNTER — Telehealth: Payer: Self-pay

## 2014-07-03 ENCOUNTER — Encounter: Payer: Self-pay | Admitting: Adult Health

## 2014-07-03 ENCOUNTER — Ambulatory Visit (INDEPENDENT_AMBULATORY_CARE_PROVIDER_SITE_OTHER): Payer: 59 | Admitting: Adult Health

## 2014-07-03 VITALS — BP 96/51 | HR 77 | Temp 97.5°F | Ht 65.0 in | Wt 156.0 lb

## 2014-07-03 DIAGNOSIS — G47411 Narcolepsy with cataplexy: Secondary | ICD-10-CM

## 2014-07-03 MED ORDER — AMPHETAMINE-DEXTROAMPHETAMINE 30 MG PO TABS: 30.0000 mg | ORAL_TABLET | Freq: Two times a day (BID) | ORAL | Status: DC

## 2014-07-03 NOTE — Patient Instructions (Signed)
Sodium Oxybate oral solution What is this medicine? SODIUM OXYBATE (SOE dee um OX i bate) is used to treat excessive sleepiness and cataplexy in patients with narcolepsy. Cataplexy causes a sudden muscle weakness due to a strong emotional response. This medicine is not available in retail pharmacies. Your doctor will enroll you in a program that will provide the drug to you. This medicine may be used for other purposes; ask your health care provider or pharmacist if you have questions. COMMON BRAND NAME(S): Xyrem What should I tell my health care provider before I take this medicine? They need to know if you have any of these conditions: -depression, psychosis, or other mood disorders -heart disease or high blood pressure -if you frequently drink alcohol containing beverages -if you have a history of drug or alcohol abuse -liver disease -lung disease or difficulty breathing -seizures -succinic semialdehyde dehydrogenase deficiency -thoughts of suicide -an unusual or allergic reaction to sodium oxybate, other medicines, foods, dyes, or preservatives -pregnant or trying to get pregnant -breast-feeding How should I use this medicine? Take this medicine by mouth. Follow the directions on the prescription label. Take this medicine on an empty stomach, at least 30 minutes before or 2 hours after food. Do not take with food. Do not take your medicine more often than directed. A special MedGuide will be given to you by the pharmacist with each prescription and refill. Be sure to read this information carefully each time. Talk to your pediatrician regarding the use of this medicine in children. Special care may be needed. Overdosage: If you think you have taken too much of this medicine contact a poison control center or emergency room at once. NOTE: This medicine is only for you. Do not share this medicine with others. What if I miss a dose? Skip the missed dose. If it is almost time for your next  dose, take only that dose. Allow at least two and one-half hours between each nightly dose. Do not take double or extra doses. What may interact with this medicine? Do not take this medicine with any of the following medications: -alcohol -barbiturates, like phenobarbital -medicines commonly used for anxiety, sedation or insomnia This medicine may also interact with the following medications: -bupropion -divalproex sodium -dronabinol or marijuana -medicines for psychosis or severe mood disturbances -muscle relaxants -other stimulants, although these are commonly used with sodium oxybate -prescription pain medicines, including tramadol -valproate or valproic acid This list may not describe all possible interactions. Give your health care provider a list of all the medicines, herbs, non-prescription drugs, or dietary supplements you use. Also tell them if you smoke, drink alcohol, or use illegal drugs. Some items may interact with your medicine. What should I watch for while using this medicine? The use of this medicine requires careful supervision. Visit your doctor or health care professional for regular checks on your progress. Do not suddenly stop taking this medicine if you have been taking it for a long time. Withdrawal symptoms may occur. Your doctor or health care professional may need to slowly stop your doses. This medicine may affect your concentration or function. Let your doctor or health care professional know if you have increased sleepiness or confusion during the day. This medicine causes sleep very quickly. You should only take this drug at bedtime, while in bed. Do not drive a car, operate heavy machinery or perform any activities that require mental alertness for at least 6 hours after taking this drug. Use extreme care in any such   daily activities until you know how this medicine affects you. Because alcohol may interfere with this medicine and may cause serious side effects,  you must avoid alcohol-containing beverages while on this medicine. Do not take this medicine along with sleep medicines or other drugs with strong sedative effects, serious side effects may occur. This medicine can be dangerous in overdose. If you take more than prescribed or take it by accident, get emergency medical help right away. What side effects may I notice from receiving this medicine? Side effects that you should report to your doctor or health care professional as soon as possible: -allergic reactions like skin rash, itching or hives, swelling of the face, lips, or tongue -breathing problems -confusion -fast, irregular heartbeat -increased blood pressure, particularly if you already have high blood pressure -memory loss -seizures -sleepwalking -tremors or shaking movements -urinary incontinence Side effects that usually do not require medical attention (report to your doctor or health care professional if they continue or are bothersome): -dizziness -drowsiness -headache -increased urination -nausea, vomiting or stomach upset -unusual dreams This list may not describe all possible side effects. Call your doctor for medical advice about side effects. You may report side effects to FDA at 1-800-FDA-1088. Where should I keep my medicine? Keep out of reach of children. This medicine can be abused. Keep your medicine in a safe place to protect it from theft. Do not share this medicine with anyone. Selling or giving away this medicine is dangerous and against the law. Store at room temperature between 15 and 30 degrees C (59 and 86 degrees F). This medicine may cause accidental overdose and death if it is taken by other adults, children, or pets. Flush any unused medicine down the toilet to reduce the chance of harm. Do not use the medicine after the expiration date. NOTE: This sheet is a summary. It may not cover all possible information. If you have questions about this medicine,  talk to your doctor, pharmacist, or health care provider.  2015, Elsevier/Gold Standard. (2013-02-26 15:45:13)  

## 2014-07-03 NOTE — Progress Notes (Addendum)
PATIENT: Cynthia [name] DOB: 07-01-1971  REASON FOR VISIT: follow up HISTORY FROM: patient  HISTORY OF PRESENT ILLNESS: Cynthia [name] is a 43 year old female with a history of narcolepsy with cataplexy, anxiety and ADD. She returns today for follow-up. She is currently taking xyrem.  She states that she can't tolerate  xyrem at total of 9 mg.She states that she will fall if she gets up to urinate during the night. She decreased Cynthia [name] dose and is taking 4.5g and 3  at the second dose. She reports that this works better for Cynthia [name]. She reports that Cynthia [name] sleepiness is starting to  improved.  She is also taking Adderall 30 mg BID but will often take 3 tablets.  She reports that Cynthia [name] Cymbalta was recently increased and it has helped Cynthia [name] mood. No new neurological complaints. She was recently diagnosed with carpal tunnel syndrome and will have to have surgery at some point.    HISTORY 04/30/14 Cynthia [name]): Cynthia [name] is a 43 year old right-handed woman with an underlying medical history of anxiety, ADD and recurrent headaches, who presents for followup consultation of Cynthia [name] narcolepsy with cataplexy. She is unaccompanied today. [name] last saw Cynthia [name] on 08/06/2013, at which time she reported problems with skin picking. She had been on Cymbalta which helped with Cynthia [name] mood but she was worried about weight gain and therefore stopped the Cymbalta. [name] advised Cynthia [name] to restart it but in the interim Cynthia [name] primary care physician started Cynthia [name] on Effexor. Previously, for Cynthia [name] daytime somnolence she had been on Ritalin, Nuvigil and Provigil which did not help as well as Adderall long-acting which did not help as well as the immediate release Adderall. [name] kept Cynthia [name] on 30 mg twice daily of immediate release Adderall. [name] started Cynthia [name] on Xyrem. We talked about possible side effects including exacerbation of depression at the time. Cataplexy has not been a big player at this time. In the interim, she no showed for an appointment on 11/07/2013 with me. She  arrived late for an appointment on 12/24/2013 with nurse practitioner Cynthia [name] and was rescheduled for 12/29/2013. She saw Cynthia [name] also saw Cynthia [name] at the time and discussed Cynthia [name] plan. She reported on 12/29/2013 that she had stopped the Xyrem after the beginning dose because she did not feel that she was sleeping well with it. She had only been using the initial dose and as she did not fall asleep after the first dose for the night, she started working on Cynthia [name] assignments for work. She did endorse a lot of work-related stress. [name] encouraged Cynthia [name] to restart Xyrem with a faster titration but not beyond what is recommended and a final dose of 6 or 9 g. [name] did keep Cynthia [name] on the Adderall immediate release, 60 mg daily. She has in the interim been seen for by GI for right upper quadrant pain. She had workup for this but no etiology has been found. Cynthia [name] Cymbalta has been increased by Cynthia [name] primary care physician. She was referred to therapy for Cynthia [name] mood disorder and stress but did not go back due to work schedule. She continued to endorse a lot of - primarily work-related - stress.   Today, she reports that she stopped the Xyrem about a month ago because she had trouble affording it. Of note, she was getting it through patient assistance and was paying $35 for it. Nevertheless she had other pills and felt that she could not afford it. She has in the last month noted  a gradual increase in Cynthia [name] sleepiness and slept after work yesterday until the next day. She noticed to Cynthia [name] husband that she was feeling worse. She would like to get back on it and has actually requested a repeat shipment and is going to start on it next week. She understands that this is not something she could stop abruptly and should continue to use it because it worked well for Cynthia [name] at the 9 g total dose. She has been on Cymbalta 90 mg daily and feels improved in Cynthia [name] skin picking and Cynthia [name] depression:    REVIEW OF SYSTEMS: Out of a complete 14 system review of  symptoms, the patient complains only of the following symptoms, and all other reviewed systems are negative.  Appetite change Insomnia Daytime sleepiness Numbness Depression  Nervous/anxious  ALLERGIES: Allergies  Allergen Reactions  . Doxycycline Hyclate Other (See Comments)     heartburn  . Meclizine Hcl Hives  . Morphine Itching    severe    HOME MEDICATIONS: Outpatient Prescriptions Prior to Visit  Medication Sig Dispense Refill  . amphetamine-dextroamphetamine (ADDERALL) 30 MG tablet Take 1 tablet by mouth 2 (two) times daily. Take second dose no later than 3 PM 60 tablet 0  . clonazePAM (KLONOPIN) 0.5 MG tablet Take 1 tablet (0.5 mg total) by mouth 3 (three) times daily as needed for anxiety. 90 tablet 1  . DULoxetine (CYMBALTA) 30 MG capsule Take 3 capsules (90 mg total) by mouth daily. 90 capsule 3  . XYREM 500 MG/ML SOLN     . meloxicam (MOBIC) 15 MG tablet Take 1 tablet (15 mg total) by mouth daily. 30 tablet 1  . pantoprazole (PROTONIX) 40 MG tablet Take 1 tablet (40 mg total) by mouth daily. 30 tablet 2  . Salicylic Acid (NEUTROGENA OIL-FREE ACNE WASH EX) Apply 1 application topically 2 (two) times daily.     No facility-administered medications prior to visit.    PAST MEDICAL HISTORY: Past Medical History  Diagnosis Date  . Anxiety and depression 04/03/2008  . Acne   . Hemorrhoids   . Narcolepsy   . Reflux   . Basal cell cancer may 2014    forehead basal cell  . Narcolepsy   . Anxiety   . GERD (gastroesophageal reflux disease)   . Headache(784.0)     hx migraines, none recent  . Elevated lipase last Jan 02, 2014    x 2  . Complication of anesthesia   . PONV (postoperative nausea and vomiting)     nausea on occasional, dilaudid post op best for post op pain    PAST SURGICAL HISTORY: Past Surgical History  Procedure Laterality Date  . Partial hysterectomy    . Bunionectomy    . Cesarean section      x 2  . Skin cancer excision    .  Esophagogastroduodenoscopy  2011    Dr. Jola BabinskiGessner--Normal   . Surgery for endomtriosis  yrs ago  . Eus N/A 01/22/2014    Procedure: UPPER ENDOSCOPIC ULTRASOUND (EUS) LINEAR;  Surgeon: Rachael Feeaniel P Jacobs, MD;  Location: [facility];  Service: Endoscopy;  Laterality: N/A;    FAMILY HISTORY: Family History  Problem Relation Age of Onset  . Prostate cancer Maternal Grandfather   . Kidney disease Sister   . Heart disease Paternal Grandfather   . Depression      SOCIAL HISTORY: History   Social History  . Marital Status: Married    Spouse Name: [name]    Number of Children:  2  . 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 Smoking status: Former Smoker -- 1.00 packs/day for 10 years    Types: Cigarettes    Quit date: 08/15/2003  . Smokeless tobacco: Never Used  . Alcohol Use: No     Comment: rare  . Drug Use: No  . Sexual Activity: Not on file   Other Topics Concern  . Not on file   Social History Narrative   Pt lives at home with husband [name] and two children   Pt is right handed    Education - Bachelors degree   Caffeine 1-2 cups a day      PHYSICAL EXAM  Filed Vitals:   07/03/14 1128  BP: 96/51  Pulse: 77  Temp: 97.5 F (36.4 C)  TempSrc: Oral  Height: 5\' 5"  (1.651 m)  Weight: 156 lb (70.761 kg)   Body mass index is 25.96 kg/(m^2).  Generalized: Well developed, in no acute distress  Neck: neck circumference 14.5 inches, Mallampati 2+  Neurological examination  Mentation: Alert oriented to time, place, history taking. Follows all commands speech and language fluent Cranial nerve II-XII: Pupils were equal round reactive to light. Extraocular movements were full, visual field were full on confrontational test. Facial sensation and strength were normal. Uvula tongue midline. Head turning and shoulder shrug  were normal and symmetric. Motor: The motor testing reveals 5 over 5 strength of all 4  extremities. Good symmetric motor tone is noted throughout.  Sensory: Sensory testing is intact to soft touch on all 4 extremities. No evidence of extinction is noted.  Coordination: Cerebellar testing reveals good finger-nose-finger and heel-to-shin bilaterally.  Gait and station: Gait is normal. Tandem gait is normal. Romberg is negative. No drift is seen.  Reflexes: Deep tendon reflexes are symmetric and normal bilaterally.   DIAGNOSTIC DATA (LABS, IMAGING, TESTING) - [name] reviewed patient records, labs, notes, testing and imaging myself where available.  Lab Results  Component Value Date   WBC 6.2 12/25/2013   HGB 12.6 12/25/2013   HCT 36.5 12/25/2013   MCV 88.6 12/25/2013   PLT 261 12/25/2013      Component Value Date/Time   NA 135* 12/25/2013 1334   K 4.2 12/25/2013 1334   CL 97 12/25/2013 1334   CO2 29 12/25/2013 1334   GLUCOSE 101* 12/25/2013 1334   BUN 15 12/25/2013 1334   CREATININE 0.66 12/25/2013 1334   CALCIUM 10.3 12/25/2013 1334   PROT 7.9 12/25/2013 1334   ALBUMIN 4.0 12/25/2013 1334   AST 29 12/25/2013 1334   ALT 52* 12/25/2013 1334   ALKPHOS 94 12/25/2013 1334   BILITOT 0.3 12/25/2013 1334   GFRNONAA >90 12/25/2013 1334   GFRAA >90 12/25/2013 1334    Lab Results  Component Value Date   TSH 1.19 09/27/2011      ASSESSMENT AND PLAN 43 y.o. year old female  has a past medical history of Anxiety and depression (04/03/2008); Acne; Hemorrhoids; Narcolepsy; Reflux; Basal cell cancer (may 2014); Narcolepsy; Anxiety; GERD (gastroesophageal reflux disease); Headache(784.0); Elevated lipase (last Jan 02, 2014); Complication of anesthesia; and PONV (postoperative nausea and vomiting). here with:  1. Narcolepsy  Patient could not tolerate the increase dose of Xyrem 9g. She reduces Cynthia [name] dose to 7.5 g. She reports that she can tolerate this better. [name] encouraged Cynthia [name] to continue at this dose in order for Cynthia [name] to see maximal benefit. She continues to  take Adderall. She  often will take 3 tablets instead of 2 tablets. [name] explained that she should take the medication as prescribed. She verbalized understanding. The patient will follow-up in 6 months or sooner if needed.   Butch PennyMegan Kenard Morawski, MSN, NP-C 07/03/2014, 11:38 AM [facility] [address], [address] [place], [place] [address] [phone]  Note: This document was prepared with digital dictation and possible smart phrase technology. Any transcriptional errors that result from this process are unintentional.   [name] reviewed the above note and documentation by the Nurse Practitioner and agree with the history, physical exam, assessment and plan as outlined above. [name] was immediately available for face-to-face consultation. Huston FoleySaima Athar, MD, PhD [facility]

## 2014-07-03 NOTE — Telephone Encounter (Signed)
I called Xyrem.  Spoke with Samara DeistKathryn.  Advised dose has been adjusted.  She will note the account and they will dispense new dose on next fill.  They will call us back if anything further is needed.

## 2014-07-22 ENCOUNTER — Other Ambulatory Visit: Payer: Self-pay | Admitting: Family

## 2014-07-31 ENCOUNTER — Encounter: Payer: Self-pay | Admitting: Neurology

## 2014-07-31 ENCOUNTER — Ambulatory Visit (INDEPENDENT_AMBULATORY_CARE_PROVIDER_SITE_OTHER): Payer: 59 | Admitting: Neurology

## 2014-07-31 VITALS — BP 96/66 | HR 72 | Ht 61.0 in | Wt 154.0 lb

## 2014-07-31 DIAGNOSIS — R519 Headache, unspecified: Secondary | ICD-10-CM

## 2014-07-31 DIAGNOSIS — R51 Headache: Secondary | ICD-10-CM

## 2014-07-31 DIAGNOSIS — G47411 Narcolepsy with cataplexy: Secondary | ICD-10-CM

## 2014-07-31 DIAGNOSIS — G5601 Carpal tunnel syndrome, right upper limb: Secondary | ICD-10-CM

## 2014-07-31 DIAGNOSIS — R29898 Other symptoms and signs involving the musculoskeletal system: Secondary | ICD-10-CM

## 2014-07-31 DIAGNOSIS — R292 Abnormal reflex: Secondary | ICD-10-CM

## 2014-07-31 DIAGNOSIS — M6289 Other specified disorders of muscle: Secondary | ICD-10-CM

## 2014-07-31 DIAGNOSIS — M5412 Radiculopathy, cervical region: Secondary | ICD-10-CM

## 2014-07-31 NOTE — Progress Notes (Signed)
[facility] Clinic Note - Initial Visit   Date: 07/31/2014  Cynthia MouseHeather M [name] MRN: [id number] DOB: 07/29/1971   Dear Adline MangoPadonda Campbell, NP:  Thank you for your kind referral of Cynthia [name] for consultation of carpal tunnel syndrome. Although her history is well known to you, please allow us to reiterate it for the purpose of our medical record. The patient was accompanied to the clinic by self.   History of Present Illness: Cynthia [name] is a 43 y.o. right-handed Caucasian female with anxiety/depression, narcolepsy with cataplexy ([facility]), and GERD presenting for evaluation of right hand paresthesias.  During the summer 2015, she developed numbness of her fingers and shooting pain of her hands, worse at night.  She was her PCP who recommended using a wrist splint and trial of prednisone.  She endorses weakness and is dropping things, shoulder and neck pain.  Similar symptoms are also affecting her left arm, but to a lesser extent.  Her EMG in October 2015 was performed on both arms, but showed only right CTS.    She was previously seen at [facility] in 2013 by Dr. Modesto CharonWong for chronic headaches at which time she was taking topamax and compazine (abortive agent).  She continues to have almost daily headaches, described as throbbing and holocephalic.  She endorses photophobia and nausea.  Headaches are all stress-related.  She had had headaches since 2001.  Her mother has occasional headaches.  She takes exedrin 2-3 times per week, which helps.  She has previously been on topamax (sleepy) and [facility].  She is being [facility] for narcolepsy with cataplexy.  Out-side paper records, electronic medical record, and images have been reviewed where available and summarized as:  EMG 06/04/2014: 1. Right median neuropathy at or distal to the wrist, consistent with the clinical diagnosis of carpal  tunnel syndrome. Overall, these findings are moderate in degree electrically. 2. Electrodiagnostic testing of the left upper extremity is normal. In particular, there is no evidence of carpal tunnel syndrome or cervical radiculopathy affecting the left side.  MRI brain 01/04/2012:  normal  Lab Results  Component Value Date   TSH 1.19 09/27/2011    Past Medical History  Diagnosis Date  . Anxiety and depression 04/03/2008  . Acne   . Hemorrhoids   . Narcolepsy   . Reflux   . Basal cell cancer may 2014    forehead basal cell  . Narcolepsy   . Anxiety   . GERD (gastroesophageal reflux disease)   . Headache(784.0)     hx migraines, none recent  . Elevated lipase last Jan 02, 2014    x 2  . Complication of anesthesia   . PONV (postoperative nausea and vomiting)     nausea on occasional, dilaudid post op best for post op pain    Past Surgical History  Procedure Laterality Date  . Partial hysterectomy    . Bunionectomy    . Cesarean section      x 2  . Skin cancer excision    . Esophagogastroduodenoscopy  2011    Dr. Jola BabinskiGessner--Normal   . Surgery for endomtriosis  yrs ago  . Eus N/A 01/22/2014    Procedure: UPPER ENDOSCOPIC ULTRASOUND (EUS) LINEAR;  Surgeon: Rachael Feeaniel P Jacobs, MD;  Location: [facility];  Service: Endoscopy;  Laterality: N/A;     Medications:  Current Outpatient Prescriptions on File Prior to Visit  Medication  Sig Dispense Refill  . amphetamine-dextroamphetamine (ADDERALL) 30 MG tablet Take 1 tablet by mouth 2 (two) times daily. Take second dose no later than 3 PM 60 tablet 0  . clonazePAM (KLONOPIN) 0.5 MG tablet TAKE ONE TABLET BY MOUTH THREE TIMES DAILY AS NEEDED FOR ANXIETY 90 tablet 1  . DULoxetine (CYMBALTA) 30 MG capsule TAKE THREE CAPSULES BY MOUTH DAILY. 90 capsule 0  . XYREM 500 MG/ML SOLN      No current facility-administered medications on file prior to visit.    Allergies:  Allergies  Allergen Reactions  . Doxycycline Hyclate Other (See  Comments)     heartburn  . Meclizine Hcl Hives  . Morphine Itching    severe    Family History: Family History  Problem Relation Age of Onset  . Prostate cancer Maternal Grandfather   . Kidney disease Sister   . Heart disease Paternal Grandfather   . Depression      Social History: History   Social History  . Marital Status: Married    Spouse Name: [name]    Number of Children: 2  . 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 Smoking status: Former Smoker -- 1.00 packs/day for 10 years    Types: Cigarettes    Quit date: 08/14/2004  . Smokeless tobacco: Never Used  . Alcohol Use: No  . Drug Use: No  . Sexual Activity: Not on file   Other Topics Concern  . Not on file   Social History Narrative   Pt lives at home with husband [name] and two children   Pt is right handed    Education - Bachelors degree   Caffeine 1-2 cups a day    Review of Systems:  CONSTITUTIONAL: No fevers, chills, night sweats, or weight loss.   EYES: No visual changes or eye pain ENT: No hearing changes.  No history of nose bleeds.   RESPIRATORY: No cough, wheezing and shortness of breath.   CARDIOVASCULAR: Negative for chest pain, and palpitations.   GI: Negative for abdominal discomfort, blood in stools or black stools.  No recent change in bowel habits.   GU:  No history of incontinence.   MUSCLOSKELETAL: No history of joint pain or swelling.  No myalgias.   SKIN: Negative for lesions, rash, and itching.   HEMATOLOGY/ONCOLOGY: Negative for prolonged bleeding, bruising easily, and swollen nodes.  No history of cancer.   ENDOCRINE: Negative for cold or heat intolerance, polydipsia or goiter.   PSYCH:  No depression +anxiety symptoms.   NEURO: As Above.   Vital Signs:  BP 96/66 mmHg  Pulse 72  Ht 5\' 1"  (1.549 m)  Wt 154 lb (69.854 kg)  BMI 29.11 kg/m2   General Medical Exam:   General:  Well appearing,  comfortable.   Eyes/ENT: see cranial nerve examination.   Neck: No masses appreciated.  Full range of motion without tenderness.  No carotid bruits. Respiratory:  Clear to auscultation, good air entry bilaterally.   Cardiac:  Regular rate and rhythm, no murmur.   Extremities:  No deformities, edema, or skin discoloration.  Skin:  No rashes or lesions.  Neurological Exam: MENTAL STATUS including orientation to time, place, person, recent and remote memory, attention span and concentration, language, and fund of knowledge is normal.  Speech is not dysarthric.  CRANIAL NERVES: II:  No visual field defects.  Unremarkable fundi.   III-IV-VI: Pupils equal round  and reactive to light.  Normal conjugate, extra-ocular eye movements in all directions of gaze.  No nystagmus.  No ptosis.   V:  Normal facial sensation.  Jaw jerk is absent.   VII:  Normal facial symmetry and movements.  No pathologic facial reflexes.  VIII:  Normal hearing and vestibular function.   IX-X:  Normal palatal movement.   XI:  Normal shoulder shrug and head rotation.   XII:  Normal tongue strength and range of motion, no deviation or fasciculation.  MOTOR:  No atrophy, fasciculations or abnormal movements.  No pronator drift.  Tone is normal.    Right Upper Extremity:    Left Upper Extremity:    Deltoid  5/5   Deltoid  5/5   Biceps  5/5   Biceps  5/5   Triceps  5/5   Triceps  5/5   Wrist extensors  5/5   Wrist extensors  5/5   Wrist flexors  5/5   Wrist flexors  5/5   Finger extensors  5/5   Finger extensors  5/5   Finger flexors  5/5   Finger flexors  5/5   Dorsal interossei  5/5   Dorsal interossei  5/5   Abductor pollicis  5-/5   Abductor pollicis  5/5   Tone (Ashworth scale)  0  Tone (Ashworth scale)  0   Right Lower Extremity:    Left Lower Extremity:    Hip flexors  5/5   Hip flexors  5/5   Hip extensors  5/5   Hip extensors  5/5   Knee flexors  5/5   Knee flexors  5/5   Knee extensors  5/5   Knee extensors   5/5   Dorsiflexors  5/5   Dorsiflexors  5/5   Plantarflexors  5/5   Plantarflexors  5/5   Toe extensors  5/5   Toe extensors  5/5   Toe flexors  5/5   Toe flexors  5/5   Tone (Ashworth scale)  0  Tone (Ashworth scale)  0   MSRs:  Right                                                                 Left brachioradialis 3+  brachioradialis 3+  biceps 3+  biceps 3+  triceps 3+  triceps 3+  patellar 3+  patellar 3+  ankle jerk 2+  ankle jerk 2+  Hoffman no  Hoffman no  plantar response down  plantar response down   SENSORY:  Normal and symmetric perception of light touch, pinprick, vibration, and proprioception.  Romberg's sign absent.   COORDINATION/GAIT: Normal finger-to- nose-finger and heel-to-shin.  Intact rapid alternating movements bilaterally.  Able to rise from a chair without using arms.  Gait narrow based and stable. Tandem and stressed gait intact.    IMPRESSION: 1.  Right carpal tunnel syndrome  - Exam with mild right ABP weakness  - There has been no improvement with conservative management options, so will refer to Hand specialist for evaluation 2.  Chronic daily headaches, possible medication overuse headaches  - Limited by oral preventative options due to sedation especially she is already has narcolepsy.   - Recommend starting magnesium supplementation  - Previously tried topamax, zanaflex, and compazine  - Consider Botox  if headaches become worse 3.  Generalized hyperreflexia with bilateral arm paresthesias and radicular pain.   -  Imaging of her cervical spine will be ordered to better characterize her pain, especially as her pain is greater than what is expected for carpal tunnel syndrome  4.  Narcolepsy with cataplexy, followed by Dr. Peggye LeyAther  PLAN/RECOMMENDATIONS:  1.  Start taking magnesium 400mg -600mg  daily 2.  MRI cervical spine wo contrast.   3.  [facility] for right [facility]pal tunnel syndrome  4.  Try to limit the use of excedrin  and other ibuprofen medications to twice per week  5.  Return to clinic as needed    The duration of this appointment visit was 45 minutes of face-to-face time with the patient.  Greater than 50% of this time was spent in counseling, explanation of diagnosis, planning of further management, and coordination of care.   Thank you for allowing me to participate in patient's care.  If I can answer any additional questions, I would be pleased to do so.    Sincerely,    Donika K. Allena KatzPatel, DO

## 2014-07-31 NOTE — Patient Instructions (Addendum)
1.  Start taking magnesium 400mg -600mg  daily 2.  MRI cervical spine wo contrast.  We will call you with the results. 3.  [facility]  4.  Try to limit the use of excedrin and other ibuprofen medications to twice per week 5.  Return to clinic as needed

## 2014-08-03 ENCOUNTER — Other Ambulatory Visit: Payer: Self-pay | Admitting: Neurology

## 2014-08-03 DIAGNOSIS — G47411 Narcolepsy with cataplexy: Secondary | ICD-10-CM

## 2014-08-03 MED ORDER — AMPHETAMINE-DEXTROAMPHETAMINE 30 MG PO TABS: 30.0000 mg | ORAL_TABLET | Freq: Two times a day (BID) | ORAL | Status: DC

## 2014-08-03 NOTE — Telephone Encounter (Signed)
Pt needs a written Rx for amphetamine-dextroamphetamine (ADDERALL) 30 MG tablet she only has one pill left. Please call when ready for pick up.

## 2014-08-03 NOTE — Telephone Encounter (Signed)
Request entered, forwarded to provider for approval.  

## 2014-08-03 NOTE — Telephone Encounter (Signed)
I called the patient to let them know their Rx for Adderall was ready for pickup. Patient was instructed to bring Photo ID. 

## 2014-08-06 ENCOUNTER — Inpatient Hospital Stay: Admission: RE | Admit: 2014-08-06 | Payer: Self-pay | Source: Ambulatory Visit

## 2014-08-13 ENCOUNTER — Telehealth: Payer: Self-pay | Admitting: Neurology

## 2014-08-13 NOTE — Telephone Encounter (Signed)
Pt needs something for pain please call her at [phone] she states that she is in a lot of pain

## 2014-08-17 NOTE — Telephone Encounter (Signed)
Called patient back and left her a message that Dr. Allena Katz does not prescribe pain medications and instructed her to call her PCP or wait to see orthopedic doctor.

## 2014-09-03 ENCOUNTER — Other Ambulatory Visit: Payer: Self-pay | Admitting: Neurology

## 2014-09-03 ENCOUNTER — Other Ambulatory Visit: Payer: Self-pay | Admitting: Family

## 2014-09-03 DIAGNOSIS — G47411 Narcolepsy with cataplexy: Secondary | ICD-10-CM

## 2014-09-03 MED ORDER — AMPHETAMINE-DEXTROAMPHETAMINE 30 MG PO TABS: 30.0000 mg | ORAL_TABLET | Freq: Two times a day (BID) | ORAL | Status: DC

## 2014-09-03 NOTE — Telephone Encounter (Signed)
Patient is calling to get a written Rx for Adderall. Please call patient when ready for pickup.Thank you. °

## 2014-09-03 NOTE — Telephone Encounter (Signed)
Request entered, forwarded to provider for approval.  

## 2014-09-07 ENCOUNTER — Telehealth: Payer: Self-pay

## 2014-09-07 NOTE — Telephone Encounter (Signed)
Called patient to inform Rx ready for pick up at front desk. No answer. Left vmail. 

## 2014-09-11 ENCOUNTER — Emergency Department (HOSPITAL_COMMUNITY): Payer: 59

## 2014-09-11 ENCOUNTER — Emergency Department (HOSPITAL_COMMUNITY)
Admission: EM | Admit: 2014-09-11 | Discharge: 2014-09-11 | Disposition: A | Discharge status: Home / Self Care | Payer: 59 | Attending: Emergency Medicine | Admitting: Emergency Medicine

## 2014-09-11 ENCOUNTER — Encounter (HOSPITAL_COMMUNITY): Payer: Self-pay

## 2014-09-11 DIAGNOSIS — F329 Major depressive disorder, single episode, unspecified: Secondary | ICD-10-CM | POA: Insufficient documentation

## 2014-09-11 DIAGNOSIS — S43142A Inferior dislocation of left acromioclavicular joint, initial encounter: Secondary | ICD-10-CM | POA: Insufficient documentation

## 2014-09-11 DIAGNOSIS — W1839XA Other fall on same level, initial encounter: Secondary | ICD-10-CM | POA: Insufficient documentation

## 2014-09-11 DIAGNOSIS — F419 Anxiety disorder, unspecified: Secondary | ICD-10-CM | POA: Insufficient documentation

## 2014-09-11 DIAGNOSIS — Y9389 Activity, other specified: Secondary | ICD-10-CM | POA: Insufficient documentation

## 2014-09-11 DIAGNOSIS — Z8669 Personal history of other diseases of the nervous system and sense organs: Secondary | ICD-10-CM | POA: Insufficient documentation

## 2014-09-11 DIAGNOSIS — Y998 Other external cause status: Secondary | ICD-10-CM | POA: Diagnosis not present

## 2014-09-11 DIAGNOSIS — Y9289 Other specified places as the place of occurrence of the external cause: Secondary | ICD-10-CM | POA: Diagnosis not present

## 2014-09-11 DIAGNOSIS — S43102A Unspecified dislocation of left acromioclavicular joint, initial encounter: Secondary | ICD-10-CM

## 2014-09-11 DIAGNOSIS — Z8679 Personal history of other diseases of the circulatory system: Secondary | ICD-10-CM | POA: Diagnosis not present

## 2014-09-11 DIAGNOSIS — Z853 Personal history of malignant neoplasm of breast: Secondary | ICD-10-CM | POA: Insufficient documentation

## 2014-09-11 DIAGNOSIS — S4992XA Unspecified injury of left shoulder and upper arm, initial encounter: Secondary | ICD-10-CM | POA: Diagnosis present

## 2014-09-11 MED ORDER — OXYCODONE-ACETAMINOPHEN 5-325 MG PO TABS
2.0000 | ORAL_TABLET | Freq: Once | ORAL | Status: AC
  Administered 2014-09-11: 2 via ORAL
  Filled 2014-09-11: qty 2

## 2014-09-11 MED ORDER — OXYCODONE-ACETAMINOPHEN 5-325 MG PO TABS: 1.0000 | ORAL_TABLET | ORAL | Status: DC | PRN

## 2014-09-11 NOTE — ED Provider Notes (Signed)
CSN: [id number]     Arrival date & time 09/11/14  0450 History   First MD Initiated Contact with Patient 09/11/14 0559     Chief Complaint  Patient presents with  . Shoulder Pain     (Consider location/radiation/quality/duration/timing/severity/associated sxs/prior Treatment) Patient is a 44 y.o. female presenting with shoulder pain. The history is provided by the patient and medical records.  Shoulder Pain   This is a 44 year old female with past medical history significant for narcolepsy, migraine headaches, anxiety, depression, presenting to the ED for left shoulder injury. Patient states she thinks she fell asleep sitting upright and fell onto the wooden floor onto her left shoulder. She does not recall any head injury or loss of consciousness. She has no current headache, dizziness, lightheadedness, visual disturbance, or confusion. States she has significant pain in her left shoulder.  She denies numbness or paresthesias of left arm. Patient is left-hand dominant.  No intervention tried PTA.  Past Medical History  Diagnosis Date  . Anxiety and depression 04/03/2008  . Acne   . Hemorrhoids   . Narcolepsy   . Reflux   . Basal cell cancer may 2014    forehead basal cell  . Narcolepsy   . Anxiety   . GERD (gastroesophageal reflux disease)   . Headache(784.0)     hx migraines, none recent  . Elevated lipase last Jan 02, 2014    x 2  . Complication of anesthesia   . PONV (postoperative nausea and vomiting)     nausea on occasional, dilaudid post op best for post op pain   Past Surgical History  Procedure Laterality Date  . Partial hysterectomy    . Bunionectomy    . Cesarean section      x 2  . Skin cancer excision    . Esophagogastroduodenoscopy  2011    Dr. Jola Babinski   . Surgery for endomtriosis  yrs ago  . Eus N/A 01/22/2014    Procedure: UPPER ENDOSCOPIC ULTRASOUND (EUS) LINEAR;  Surgeon: Rachael Fee, MD;  Location: [facility];  Service: Endoscopy;   Laterality: N/A;   Family History  Problem Relation Age of Onset  . Prostate cancer Maternal Grandfather   . Kidney disease Sister   . Heart disease Paternal Grandfather   . Depression     History  Substance Use Topics  . Smoking status: Former Smoker -- 1.00 packs/day for 10 years    Types: Cigarettes    Quit date: 08/14/2004  . Smokeless tobacco: Never Used  . Alcohol Use: No   OB History    No data available     Review of Systems  Musculoskeletal: Positive for arthralgias.  All other systems reviewed and are negative.     Allergies  Doxycycline hyclate; Meclizine hcl; and Morphine  Home Medications   Prior to Admission medications   Medication Sig Start Date End Date Taking? Authorizing Provider  amphetamine-dextroamphetamine (ADDERALL) 30 MG tablet Take 1 tablet by mouth 2 (two) times daily. Take second dose no later than 3 PM 09/03/14  Yes Huston Foley, MD  clonazePAM (KLONOPIN) 0.5 MG tablet TAKE ONE TABLET BY MOUTH THREE TIMES DAILY AS NEEDED FOR ANXIETY 07/22/14  Yes Baker Pierini, FNP  DULoxetine (CYMBALTA) 30 MG capsule TAKE THREE CAPSULES BY MOUTH  DAILY 09/03/14  Yes Baker Pierini, FNP  XYREM 500 MG/ML SOLN Take 9 mLs by mouth at bedtime. Dilute with 60 mls of water and repeat 2 and 1/2 to 4 hours later 02/23/14  Yes Historical Provider, MD   BP 102/61 mmHg  Pulse 81  Temp(Src) 98 F (36.7 C) (Oral)  Resp 18  Ht 5\' 1"  (1.549 m)  Wt 154 lb (69.854 kg)  BMI 29.11 kg/m2  SpO2 100%   Physical Exam  Constitutional: She is oriented to person, place, and time. She appears well-developed and well-nourished.  HENT:  Head: Normocephalic and atraumatic.  Mouth/Throat: Oropharynx is clear and moist.  No visible signs of head injury  Eyes: Conjunctivae and EOM are normal. Pupils are equal, round, and reactive to light.  Neck: Normal range of motion.  Cardiovascular: Normal rate, regular rhythm and normal heart sounds.   Pulmonary/Chest: Effort normal and  breath sounds normal. No respiratory distress. She has no wheezes.  Musculoskeletal:       Left shoulder: She exhibits decreased range of motion, tenderness, bony tenderness and pain.  Left shoulder with slight deformity noted about anterior shoulder near clavicle; limited ROM due to pain; strong radial pulse and cap refill; normal grip strength; sensation intact throughout arm, hand, and all fingers  Neurological: She is alert and oriented to person, place, and time.  AAOx3, answering questions and following commands appropriately; equal strength UE and LE bilaterally; CN grossly intact; moves all extremities appropriately without ataxia; no focal neuro deficits or facial asymmetry appreciated  Skin: Skin is warm and dry.  Psychiatric: She has a normal mood and affect.  Nursing note and vitals reviewed.   ED Course  Procedures (including critical care time) Labs Review Labs Reviewed - No data to display  Imaging Review Dg Shoulder Left  09/11/2014   CLINICAL DATA:  Larey SeatFell out of bed. Pain entire left shoulder. Painful to move.  EXAM: LEFT SHOULDER - 2+ VIEW  COMPARISON:  None.  FINDINGS: Grade 3 acromioclavicular separation with inferior displacement of the acromion with respect to the clavicle. No dislocation of the glenohumeral joint. No acute fractures identified.  IMPRESSION: Grade 3 acromioclavicular separation.   Electronically Signed   By: Burman NievesWilliam  Stevens M.D.   On: 09/11/2014 05:58     EKG Interpretation None      MDM   Final diagnoses:  AC separation, left, initial encounter   44 year old female with fall onto left shoulder. No known head injury or LOC.  No visible signs of head trauma, neurologic exam is non-focal.   X-ray confirms grade 3 AC separation. Arm remains neurovascularly intact.  Patient placed in shoulder sling.  She will FU with [facility] where she is an established patient.  Rx percocet.  Discussed plan with patient, he/she acknowledged understanding  and agreed with plan of care.  Return precautions given for new or worsening symptoms.  Garlon HatchetLisa M Sanders, PA-C 09/11/14 0732  Olivia Mackielga M Otter, MD 09/11/14 [phone]

## 2014-09-11 NOTE — ED Notes (Signed)
Placed ice pack on patient's injured shoulder.

## 2014-09-11 NOTE — Discharge Instructions (Signed)
Take the prescribed medication as directed. Follow-up with [facility]-- call today to schedule appt. Return to the ED for new or worsening symptoms.

## 2014-09-11 NOTE — ED Notes (Addendum)
Pt reports she takes medication for narcolepsy and she fell asleep sitting up. She states that around 4am this morning she fell out of the bed onto wood flooring, it woke her up from her sleep and she thinks she hit her left shoulder on the bed and so she complains of left shoulder pain/painful upon movement. Pt unsure if she hit her head but denies LOC.

## 2014-09-14 DIAGNOSIS — S43109A Unspecified dislocation of unspecified acromioclavicular joint, initial encounter: Secondary | ICD-10-CM

## 2014-09-14 HISTORY — DX: Unspecified dislocation of unspecified acromioclavicular joint, initial encounter: S43.109A

## 2014-09-30 ENCOUNTER — Encounter: Payer: Self-pay | Admitting: Neurology

## 2014-09-30 ENCOUNTER — Ambulatory Visit (INDEPENDENT_AMBULATORY_CARE_PROVIDER_SITE_OTHER): Payer: 59 | Admitting: Neurology

## 2014-09-30 VITALS — BP 95/65 | HR 103 | Temp 98.0°F | Ht 61.0 in | Wt 151.0 lb

## 2014-09-30 DIAGNOSIS — G47411 Narcolepsy with cataplexy: Secondary | ICD-10-CM

## 2014-09-30 DIAGNOSIS — S43102S Unspecified dislocation of left acromioclavicular joint, sequela: Secondary | ICD-10-CM

## 2014-09-30 MED ORDER — AMPHETAMINE-DEXTROAMPHETAMINE 30 MG PO TABS: 30.0000 mg | ORAL_TABLET | Freq: Two times a day (BID) | ORAL | Status: DC
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
## 2014-09-30 NOTE — Patient Instructions (Signed)
Remember to take your Xyrem and immediately lay down! No talking to playing on the computer, no watching TV, no other activity, as you may be risk for falling, as you have!

## 2014-10-01 ENCOUNTER — Other Ambulatory Visit: Payer: Self-pay | Admitting: Orthopedic Surgery

## 2014-10-02 ENCOUNTER — Encounter: Payer: Self-pay | Admitting: Neurology

## 2014-10-02 ENCOUNTER — Telehealth: Payer: Self-pay | Admitting: Neurology

## 2014-10-02 ENCOUNTER — Telehealth: Payer: Self-pay | Admitting: *Deleted

## 2014-10-02 NOTE — Telephone Encounter (Signed)
Patient DMV form on Cynthia [name].

## 2014-10-02 NOTE — Telephone Encounter (Signed)
Cynthia RadonHeather [name] just wanted to inform Dr. Frances FurbishAthar that she is having her shoulder surgery on the February 29. Her best phone number is [phone]

## 2014-10-06 ENCOUNTER — Encounter (HOSPITAL_BASED_OUTPATIENT_CLINIC_OR_DEPARTMENT_OTHER): Payer: Self-pay | Admitting: *Deleted

## 2014-10-08 ENCOUNTER — Encounter (HOSPITAL_BASED_OUTPATIENT_CLINIC_OR_DEPARTMENT_OTHER): Payer: Self-pay | Admitting: *Deleted

## 2014-10-12 ENCOUNTER — Ambulatory Visit (HOSPITAL_BASED_OUTPATIENT_CLINIC_OR_DEPARTMENT_OTHER): Payer: 59 | Admitting: Certified Registered"

## 2014-10-12 ENCOUNTER — Ambulatory Visit (HOSPITAL_BASED_OUTPATIENT_CLINIC_OR_DEPARTMENT_OTHER)
Admission: RE | Admit: 2014-10-12 | Discharge: 2014-10-12 | Disposition: A | Discharge status: Home / Self Care | Payer: 59 | Source: Ambulatory Visit | Attending: Orthopedic Surgery | Admitting: Orthopedic Surgery

## 2014-10-12 ENCOUNTER — Encounter (HOSPITAL_BASED_OUTPATIENT_CLINIC_OR_DEPARTMENT_OTHER)
Admission: RE | Disposition: A | Discharge status: Home / Self Care | Payer: Self-pay | Source: Ambulatory Visit | Attending: Orthopedic Surgery

## 2014-10-12 ENCOUNTER — Encounter (HOSPITAL_BASED_OUTPATIENT_CLINIC_OR_DEPARTMENT_OTHER): Payer: Self-pay | Admitting: Certified Registered"

## 2014-10-12 ENCOUNTER — Ambulatory Visit (HOSPITAL_COMMUNITY): Payer: 59

## 2014-10-12 DIAGNOSIS — Z87891 Personal history of nicotine dependence: Secondary | ICD-10-CM | POA: Diagnosis not present

## 2014-10-12 DIAGNOSIS — M24412 Recurrent dislocation, left shoulder: Secondary | ICD-10-CM | POA: Insufficient documentation

## 2014-10-12 DIAGNOSIS — Z888 Allergy status to other drugs, medicaments and biological substances status: Secondary | ICD-10-CM | POA: Diagnosis not present

## 2014-10-12 DIAGNOSIS — G43909 Migraine, unspecified, not intractable, without status migrainosus: Secondary | ICD-10-CM | POA: Diagnosis not present

## 2014-10-12 DIAGNOSIS — W06XXXA Fall from bed, initial encounter: Secondary | ICD-10-CM | POA: Insufficient documentation

## 2014-10-12 DIAGNOSIS — Z886 Allergy status to analgesic agent status: Secondary | ICD-10-CM | POA: Insufficient documentation

## 2014-10-12 DIAGNOSIS — F419 Anxiety disorder, unspecified: Secondary | ICD-10-CM | POA: Insufficient documentation

## 2014-10-12 DIAGNOSIS — K219 Gastro-esophageal reflux disease without esophagitis: Secondary | ICD-10-CM | POA: Insufficient documentation

## 2014-10-12 DIAGNOSIS — M25312 Other instability, left shoulder: Secondary | ICD-10-CM | POA: Diagnosis present

## 2014-10-12 DIAGNOSIS — Z419 Encounter for procedure for purposes other than remedying health state, unspecified: Secondary | ICD-10-CM

## 2014-10-12 HISTORY — DX: Unspecified dislocation of unspecified acromioclavicular joint, initial encounter: S43.109A

## 2014-10-12 HISTORY — DX: Migraine, unspecified, not intractable, without status migrainosus: G43.909

## 2014-10-12 HISTORY — PX: ACROMIO-CLAVICULAR JOINT REPAIR: SHX5183

## 2014-10-12 HISTORY — DX: Carpal tunnel syndrome, right upper limb: G56.01

## 2014-10-12 HISTORY — DX: Dental restoration status: Z98.811

## 2014-10-12 LAB — POCT HEMOGLOBIN-HEMACUE: Hemoglobin: 12.7 g/dL (ref 12.0–15.0)

## 2014-10-12 SURGERY — REPAIR, ACROMIOCLAVICULAR JOINT
Anesthesia: Regional | Site: Shoulder | Laterality: Left

## 2014-10-12 MED ORDER — ONDANSETRON HCL 4 MG/2ML IJ SOLN: 4.0000 mg | Freq: Four times a day (QID) | INTRAMUSCULAR | Status: DC | PRN

## 2014-10-12 MED ORDER — MIDAZOLAM HCL 2 MG/2ML IJ SOLN
1.0000 mg | INTRAMUSCULAR | Status: DC | PRN
  Administered 2014-10-12: 2 mg via INTRAVENOUS

## 2014-10-12 MED ORDER — MIDAZOLAM HCL 2 MG/2ML IJ SOLN
INTRAMUSCULAR | Status: AC
  Filled 2014-10-12: qty 2

## 2014-10-12 MED ORDER — POVIDONE-IODINE 7.5 % EX SOLN: Freq: Once | CUTANEOUS | Status: DC

## 2014-10-12 MED ORDER — HYDROMORPHONE HCL 1 MG/ML IJ SOLN: 0.2500 mg | INTRAMUSCULAR | Status: DC | PRN

## 2014-10-12 MED ORDER — MIDAZOLAM HCL 2 MG/ML PO SYRP: 12.0000 mg | ORAL_SOLUTION | Freq: Once | ORAL | Status: DC | PRN

## 2014-10-12 MED ORDER — SCOPOLAMINE 1 MG/3DAYS TD PT72
1.0000 | MEDICATED_PATCH | TRANSDERMAL | Status: DC
  Administered 2014-10-12: 1.5 mg via TRANSDERMAL

## 2014-10-12 MED ORDER — BUPIVACAINE-EPINEPHRINE (PF) 0.5% -1:200000 IJ SOLN
INTRAMUSCULAR | Status: DC | PRN
  Administered 2014-10-12: 30 mL via PERINEURAL

## 2014-10-12 MED ORDER — FENTANYL CITRATE 0.05 MG/ML IJ SOLN
INTRAMUSCULAR | Status: AC
  Filled 2014-10-12: qty 6

## 2014-10-12 MED ORDER — CEFAZOLIN SODIUM-DEXTROSE 2-3 GM-% IV SOLR
INTRAVENOUS | Status: AC
  Filled 2014-10-12: qty 50

## 2014-10-12 MED ORDER — SUCCINYLCHOLINE CHLORIDE 20 MG/ML IJ SOLN
INTRAMUSCULAR | Status: DC | PRN
  Administered 2014-10-12: 100 mg via INTRAVENOUS

## 2014-10-12 MED ORDER — LIDOCAINE HCL (CARDIAC) 20 MG/ML IV SOLN
INTRAVENOUS | Status: DC | PRN
  Administered 2014-10-12: 30 mg via INTRAVENOUS

## 2014-10-12 MED ORDER — HYDROCODONE-ACETAMINOPHEN 10-325 MG PO TABS: 1.0000 | ORAL_TABLET | ORAL | Status: DC | PRN

## 2014-10-12 MED ORDER — FENTANYL CITRATE 0.05 MG/ML IJ SOLN
INTRAMUSCULAR | Status: AC
  Filled 2014-10-12: qty 2

## 2014-10-12 MED ORDER — PROPOFOL 10 MG/ML IV BOLUS
INTRAVENOUS | Status: DC | PRN
  Administered 2014-10-12: 200 mg via INTRAVENOUS

## 2014-10-12 MED ORDER — DOCUSATE SODIUM 100 MG PO CAPS: 100.0000 mg | ORAL_CAPSULE | Freq: Three times a day (TID) | ORAL | Status: DC | PRN

## 2014-10-12 MED ORDER — FENTANYL CITRATE 0.05 MG/ML IJ SOLN
50.0000 ug | INTRAMUSCULAR | Status: DC | PRN
  Administered 2014-10-12: 100 ug via INTRAVENOUS

## 2014-10-12 MED ORDER — LACTATED RINGERS IV SOLN
INTRAVENOUS | Status: DC
  Administered 2014-10-12 (×2): via INTRAVENOUS

## 2014-10-12 MED ORDER — MIDAZOLAM HCL 5 MG/5ML IJ SOLN
INTRAMUSCULAR | Status: DC | PRN
  Administered 2014-10-12: 2 mg via INTRAVENOUS

## 2014-10-12 MED ORDER — DEXAMETHASONE SODIUM PHOSPHATE 4 MG/ML IJ SOLN
INTRAMUSCULAR | Status: DC | PRN
  Administered 2014-10-12: 10 mg via INTRAVENOUS

## 2014-10-12 MED ORDER — ONDANSETRON HCL 4 MG/2ML IJ SOLN
INTRAMUSCULAR | Status: DC | PRN
  Administered 2014-10-12: 4 mg via INTRAVENOUS

## 2014-10-12 MED ORDER — SCOPOLAMINE 1 MG/3DAYS TD PT72
MEDICATED_PATCH | TRANSDERMAL | Status: DC | PRN
  Administered 2014-10-12: 1 via TRANSDERMAL

## 2014-10-12 MED ORDER — SUCCINYLCHOLINE CHLORIDE 20 MG/ML IJ SOLN
INTRAMUSCULAR | Status: AC
  Filled 2014-10-12: qty 1

## 2014-10-12 MED ORDER — SCOPOLAMINE 1 MG/3DAYS TD PT72
MEDICATED_PATCH | TRANSDERMAL | Status: AC
  Filled 2014-10-12: qty 1

## 2014-10-12 MED ORDER — EPHEDRINE SULFATE 50 MG/ML IJ SOLN
INTRAMUSCULAR | Status: DC | PRN
  Administered 2014-10-12 (×4): 10 mg via INTRAVENOUS

## 2014-10-12 MED ORDER — CEFAZOLIN SODIUM-DEXTROSE 2-3 GM-% IV SOLR
2.0000 g | INTRAVENOUS | Status: AC
  Administered 2014-10-12: 2 g via INTRAVENOUS

## 2014-10-12 SURGICAL SUPPLY — 67 items
BLADE AVERAGE 25MMX9MM (BLADE) ×1
BLADE AVERAGE 25X9 (BLADE) ×1 IMPLANT
BLADE SURG 15 STRL LF DISP TIS (BLADE) ×2 IMPLANT
BLADE SURG 15 STRL SS (BLADE) ×6
CHLORAPREP W/TINT 26ML (MISCELLANEOUS) ×5 IMPLANT
CLOSURE WOUND 1/2 X4 (GAUZE/BANDAGES/DRESSINGS) ×1
DECANTER SPIKE VIAL GLASS SM (MISCELLANEOUS) IMPLANT
DRAPE C-ARM 42X72 X-RAY (DRAPES) ×3 IMPLANT
DRAPE INCISE IOBAN 66X45 STRL (DRAPES) ×3 IMPLANT
DRAPE SURG 17X23 STRL (DRAPES) ×2 IMPLANT
DRAPE U-SHAPE 47X51 STRL (DRAPES) ×6 IMPLANT
DRAPE U-SHAPE 76X120 STRL (DRAPES) ×6 IMPLANT
DRSG TEGADERM 4X4.75 (GAUZE/BANDAGES/DRESSINGS) ×6 IMPLANT
ELECT BLADE 6.5 .24CM SHAFT (ELECTRODE) IMPLANT
ELECT REM PT RETURN 9FT ADLT (ELECTROSURGICAL) ×3
ELECTRODE REM PT RTRN 9FT ADLT (ELECTROSURGICAL) ×1 IMPLANT
GAUZE SPONGE 4X4 12PLY STRL (GAUZE/BANDAGES/DRESSINGS) ×3 IMPLANT
GAUZE SPONGE 4X4 16PLY XRAY LF (GAUZE/BANDAGES/DRESSINGS) IMPLANT
GLOVE BIO SURGEON STRL SZ7 (GLOVE) ×3 IMPLANT
GLOVE BIO SURGEON STRL SZ7.5 (GLOVE) ×3 IMPLANT
GLOVE BIOGEL PI IND STRL 7.0 (GLOVE) ×1 IMPLANT
GLOVE BIOGEL PI IND STRL 8 (GLOVE) ×2 IMPLANT
GLOVE BIOGEL PI INDICATOR 7.0 (GLOVE) ×4
GLOVE BIOGEL PI INDICATOR 8 (GLOVE) ×4
GLOVE ECLIPSE 6.5 STRL STRAW (GLOVE) ×2 IMPLANT
GOWN STRL REUS W/ TWL LRG LVL3 (GOWN DISPOSABLE) ×1 IMPLANT
GOWN STRL REUS W/TWL LRG LVL3 (GOWN DISPOSABLE) ×6
KIT AC JOINT DISP (KITS) ×2 IMPLANT
KIT BIO-TENODESIS 3X8 DISP (MISCELLANEOUS)
KIT INSRT BABSR STRL DISP BTN (MISCELLANEOUS) IMPLANT
NS IRRIG 1000ML POUR BTL (IV SOLUTION) ×2 IMPLANT
PACK ARTHROSCOPY DSU (CUSTOM PROCEDURE TRAY) ×3 IMPLANT
PACK BASIN DAY SURGERY FS (CUSTOM PROCEDURE TRAY) ×3 IMPLANT
PASSER SUT SWANSON 36MM LOOP (INSTRUMENTS) IMPLANT
PENCIL BUTTON HOLSTER BLD 10FT (ELECTRODE) ×3 IMPLANT
RETRIEVER SUT HEWSON (MISCELLANEOUS) ×2 IMPLANT
SHEET MEDIUM DRAPE 40X70 STRL (DRAPES) ×2 IMPLANT
SLEEVE SCD COMPRESS KNEE MED (MISCELLANEOUS) ×3 IMPLANT
SLING ARM IMMOBILIZER MED (SOFTGOODS) IMPLANT
SLING ARM LRG ADULT FOAM STRAP (SOFTGOODS) IMPLANT
SLING ARM MED ADULT FOAM STRAP (SOFTGOODS) ×2 IMPLANT
SLING ARM XL FOAM STRAP (SOFTGOODS) IMPLANT
SPONGE LAP 4X18 X RAY DECT (DISPOSABLE) ×3 IMPLANT
STRIP CLOSURE SKIN 1/2X4 (GAUZE/BANDAGES/DRESSINGS) ×2 IMPLANT
SUCTION FRAZIER TIP 10 FR DISP (SUCTIONS) ×2 IMPLANT
SUT 2 FIBERLOOP 20 STRT BLUE (SUTURE)
SUT ETHIBOND 2 OS 4 DA (SUTURE) IMPLANT
SUT ETHILON 4 0 PS 2 18 (SUTURE) IMPLANT
SUT FIBERWIRE #2 38 T-5 BLUE (SUTURE)
SUT MNCRL AB 3-0 PS2 18 (SUTURE) IMPLANT
SUT MNCRL AB 4-0 PS2 18 (SUTURE) ×3 IMPLANT
SUT PDS 2 CP NEEDLE XSPECIAL (SUTURE) ×1 IMPLANT
SUT PDS AB 0 CT 36 (SUTURE) IMPLANT
SUT PROLENE 3 0 PS 2 (SUTURE) IMPLANT
SUT TIGER TAPE 7 IN WHITE (SUTURE) IMPLANT
SUT VIC AB 0 CT1 27 (SUTURE) ×3
SUT VIC AB 0 CT1 27XBRD ANBCTR (SUTURE) IMPLANT
SUT VIC AB 2-0 SH 27 (SUTURE) ×3
SUT VIC AB 2-0 SH 27XBRD (SUTURE) ×1 IMPLANT
SUTURE 2 FIBERLOOP 20 STRT BLU (SUTURE) IMPLANT
SUTURE FIBERWR #2 38 T-5 BLUE (SUTURE) IMPLANT
SYR BULB 3OZ (MISCELLANEOUS) ×3 IMPLANT
TAPE FIBER 2MM 7IN #2 BLUE (SUTURE) IMPLANT
TOWEL OR 17X24 6PK STRL BLUE (TOWEL DISPOSABLE) ×3 IMPLANT
TOWEL OR NON WOVEN STRL DISP B (DISPOSABLE) ×1 IMPLANT
YANKAUER SUCT BULB TIP NO VENT (SUCTIONS) ×3 IMPLANT
ZIPLOOP AC JOINT REPAIR (Orthopedic Implant) ×2 IMPLANT

## 2014-10-12 NOTE — Progress Notes (Signed)
Assisted Dr. Hodierne with left, ultrasound guided, interscalene  block. Side rails up, monitors on throughout procedure. See vital signs in flow sheet. Tolerated Procedure well. 

## 2014-10-12 NOTE — Discharge Instructions (Signed)
Discharge Instructions after Open Shoulder Repair ° °A sling has been provided for you. Remain in your sling at all times. This includes sleeping in your sling.  °Use ice on the shoulder intermittently over the first 48 hours after surgery.  °Pain medicine has been prescribed for you.  °Use your medicine liberally over the first 48 hours, and then you can begin to taper your use. You may take Extra Strength Tylenol or Tylenol only in place of the pain pills. DO NOT take ANY nonsteroidal anti-inflammatory pain medications: Advil, Motrin, Ibuprofen, Aleve, Naproxen or Naprosyn.  °You may remove your dressing after two days  °You may shower 5 days after surgery. The incisions CANNOT get wet prior to 5 days. Simply allow the water to wash over the site and then pat dry. Do not rub the incisions. Make sure your axilla (armpit) is completely dry after showering.  °Take one aspirin, a day for 2 weeks after surgery, unless you have an aspirin sensitivity/ allergy or asthma. ° ° °Please call [phone] during normal business hours or [phone] after hours for any problems. Including the following: ° °- excessive redness of the incisions °- drainage for more than 4 days °- fever of more than 101.5 F ° °*Please note that pain medications will not be refilled after hours or on weekends. ° ° °Post Anesthesia Home Care Instructions ° °Activity: °Get plenty of rest for the remainder of the day. A responsible adult should stay with you for 24 hours following the procedure.  °For the next 24 hours, DO NOT: °-Drive a car °-Operate machinery °-Drink alcoholic beverages °-Take any medication unless instructed by your physician °-Make any legal decisions or sign important papers. ° °Meals: °Start with liquid foods such as gelatin or soup. Progress to regular foods as tolerated. Avoid greasy, spicy, heavy foods. If nausea and/or vomiting occur, drink only clear liquids until the nausea and/or vomiting subsides. Call your physician  if vomiting continues. ° °Special Instructions/Symptoms: °Your throat may feel dry or sore from the anesthesia or the breathing tube placed in your throat during surgery. If this causes discomfort, gargle with warm salt water. The discomfort should disappear within 24 hours. ° ° °Regional Anesthesia Blocks ° °1. Numbness or the inability to move the "blocked" extremity may last from 3-48 hours after placement. The length of time depends on the medication injected and your individual response to the medication. If the numbness is not going away after 48 hours, call your surgeon. ° °2. The extremity that is blocked will need to be protected until the numbness is gone and the  Strength has returned. Because you cannot feel it, you will need to take extra care to avoid injury. Because it may be weak, you may have difficulty moving it or using it. You may not know what position it is in without looking at it while the block is in effect. ° °3. For blocks in the legs and feet, returning to weight bearing and walking needs to be done carefully. You will need to wait until the numbness is entirely gone and the strength has returned. You should be able to move your leg and foot normally before you try and bear weight or walk. You will need someone to be with you when you first try to ensure you do not fall and possibly risk injury. ° °4. Bruising and tenderness at the needle site are common side effects and will resolve in a few days. ° °5. Persistent numbness or new   problems with movement should be communicated to the surgeon or the [facility] ([phone])/ [facility] ([phone]). °

## 2014-10-12 NOTE — Anesthesia Procedure Notes (Addendum)
Anesthesia Regional Block:  Interscalene brachial plexus block  Pre-Anesthetic Checklist: ,, timeout performed, Correct Patient, Correct Site, Correct Laterality, Correct Procedure, Correct Position, site marked, Risks and benefits discussed,  Surgical consent,  Pre-op evaluation,  At surgeon's request and post-op pain management  Laterality: Left  Prep: chloraprep       Needles:  Injection technique: Single-shot  Needle Type: Echogenic Stimulator Needle     Needle Length: 5cm 5 cm Needle Gauge: 22 and 22 G    Additional Needles:  Procedures: ultrasound guided (picture in chart) and nerve stimulator Interscalene brachial plexus block  Nerve Stimulator or Paresthesia:  Response: biceps flexion, 0.45 mA,   Additional Responses:   Narrative:  Start time: 10/12/2014 12:31 PM End time: 10/12/2014 12:42 PM Injection made incrementally with aspirations every 5 mL.  Performed by: Personally  Anesthesiologist: HODIERNE, ADAM  Additional Notes: Functioning IV was confirmed and monitors were applied.  A 50mm 22ga Arrow echogenic stimulator needle was used. Sterile prep and drape,hand hygiene and sterile gloves were used.  Negative aspiration and negative test dose prior to incremental administration of local anesthetic. The patient tolerated the procedure well.  Ultrasound guidance: relevent anatomy identified, needle position confirmed, local anesthetic spread visualized around nerve(s), vascular puncture avoided.  Image printed for medical record.    Procedure Name: Intubation Date/Time: 10/12/2014 1:46 PM Performed by: Korra Christine Pre-anesthesia Checklist: Patient identified, Emergency Drugs available, Suction available and Patient being monitored Patient Re-evaluated:Patient Re-evaluated prior to inductionOxygen Delivery Method: Circle System Utilized Preoxygenation: Pre-oxygenation with 100% oxygen Intubation Type: IV induction Ventilation: Mask ventilation without  difficulty Laryngoscope Size: Mac and 3 Grade View: Grade II Tube type: Oral Tube size: 7.0 mm Number of attempts: 1 Airway Equipment and Method: Stylet Placement Confirmation: ETT inserted through vocal cords under direct vision,  positive ETCO2 and breath sounds checked- equal and bilateral Secured at: 21 cm Tube secured with: Tape Dental Injury: Teeth and Oropharynx as per pre-operative assessment

## 2014-10-12 NOTE — Progress Notes (Signed)
Dr. Ave Filterhandler paged and his PA notified of bloody drainage to left shoulder dressing.  ABD pad and hypafix taped applies as per instructions.

## 2014-10-12 NOTE — Transfer of Care (Signed)
Immediate Anesthesia Transfer of Care Note  Patient: Cynthia MouseHeather M Titus  Procedure(s) Performed: Procedure(s) with comments: Left shoulder acromio-clavicular joint repair (Left) - Left shoulder acromio-clavicular joint repair  Patient Location: PACU  Anesthesia Type:GA combined with regional for post-op pain  Level of Consciousness: awake, alert , oriented and patient cooperative  Airway & Oxygen Therapy: Patient Spontanous Breathing and Patient connected to face mask oxygen  Post-op Assessment: Report given to RN and Post -op Vital signs reviewed and stable  Post vital signs: Reviewed and stable  Last Vitals:  Filed Vitals:   10/12/14 1300  BP:   Pulse: 91  Temp:   Resp: 21    Complications: No apparent anesthesia complications

## 2014-10-12 NOTE — Anesthesia Preprocedure Evaluation (Signed)
Anesthesia Evaluation  Patient identified by MRN, date of birth, ID band Patient awake    Reviewed: Allergy & Precautions, NPO status , Patient's Chart, lab work & pertinent test results  History of Anesthesia Complications (+) PONV  Airway Mallampati: II   Neck ROM: full    Dental   Pulmonary former smoker,          Cardiovascular negative cardio ROS      Neuro/Psych  Headaches, Anxiety Depression  Neuromuscular disease    GI/Hepatic GERD-  ,  Endo/Other    Renal/GU      Musculoskeletal   Abdominal   Peds  Hematology   Anesthesia Other Findings   Reproductive/Obstetrics                             Anesthesia Physical Anesthesia Plan  ASA: II  Anesthesia Plan: General and Regional   Post-op Pain Management: MAC Combined w/ Regional for Post-op pain   Induction: Intravenous  Airway Management Planned: Oral ETT  Additional Equipment:   Intra-op Plan:   Post-operative Plan: Extubation in OR  Informed Consent: I have reviewed the patients History and Physical, chart, labs and discussed the procedure including the risks, benefits and alternatives for the proposed anesthesia with the patient or authorized representative who has indicated his/her understanding and acceptance.     Plan Discussed with: CRNA, Surgeon and Anesthesiologist  Anesthesia Plan Comments:         Anesthesia Quick Evaluation

## 2014-10-12 NOTE — H&P (Signed)
Cynthia [name] is an 44 y.o. female.   Chief Complaint: L shoulder pain HPI: s/p fall with L grade 3 AC separation.  Failed conservative management.  Past Medical History  Diagnosis Date  . Narcolepsy   . Anxiety   . Complication of anesthesia   . PONV (postoperative nausea and vomiting)     nausea  . Migraines   . GERD (gastroesophageal reflux disease)     Gaviscon as needed  . Dental crowns present   . Carpal tunnel syndrome of right wrist   . AC separation 09/2014    left shoulder    Past Surgical History  Procedure Laterality Date  . Bunionectomy Bilateral   . Cesarean section  10/14/2005; 08/23/2007  . Eus N/A 01/22/2014    Procedure: UPPER ENDOSCOPIC ULTRASOUND (EUS) LINEAR;  Surgeon: Cynthia Feeaniel P Jacobs, MD;  Location: [facility];  Service: Endoscopy;  Laterality: N/A;  . Laparoscopic lysis of adhesions  04/30/2009  . Laparoscopic supracervical hysterectomy  07/26/2009  . Laparoscopic unilateral salpingo oopherectomy Right 07/26/2009  . Cystoscopy  07/26/2009  . Tubal ligation  08/23/2007  . Laparoscopic ovarian cystectomy Left 11/14/2002  . Endometrial ablation  11/14/2002    Family History  Problem Relation Age of Onset  . Prostate cancer Maternal Grandfather   . Kidney disease Sister   . Heart disease Paternal Grandfather    Social History:  reports that she quit smoking about 9 years ago. She has never used smokeless tobacco. She reports that she does not drink alcohol or use illicit drugs.  Allergies:  Allergies  Allergen Reactions  . Meclizine Hcl Other (See Comments)    SEVERE HEARTBURN  . Morphine Itching  . Percocet [Oxycodone-Acetaminophen] Itching    Medications Prior to Admission  Medication Sig Dispense Refill  . amphetamine-dextroamphetamine (ADDERALL) 30 MG tablet Take 1 tablet by mouth 2 (two) times daily. Take second dose no later than 3 PM 60 tablet 0  . clonazePAM (KLONOPIN) 0.5 MG tablet TAKE ONE TABLET BY MOUTH THREE TIMES DAILY AS NEEDED FOR  ANXIETY 90 tablet 1  . DULoxetine (CYMBALTA) 30 MG capsule Take 90 mg by mouth daily.    Percell [place]. XYREM 500 MG/ML SOLN Take 3.75 mLs by mouth at bedtime. Takes 3 ML 3 hours after first dose      No results found for this or any previous visit (from the past 48 hour(s)). No results found.  Review of Systems  All other systems reviewed and are negative.   Blood pressure 111/60, pulse 70, temperature 98 F (36.7 C), temperature source Oral, resp. rate 18, height 5\' 1"  (1.549 m), weight 66.679 kg (147 lb), SpO2 100 %. Physical Exam  Constitutional: She is oriented to person, place, and time. She appears well-developed and well-nourished.  HENT:  Head: Atraumatic.  Eyes: EOM are normal.  Cardiovascular: Intact distal pulses.   Respiratory: Effort normal.  Musculoskeletal:  L shoulder prominent AC, pain with ROM.  Neurological: She is alert and oriented to person, place, and time.  Skin: Skin is warm and dry.     Assessment/Plan L grade 3 ac separation Plan L AC repair Risks / benefits of surgery discussed Consent on chart  NPO for OR Preop antibiotics   Cynthia [name] Cynthia [name] 10/12/2014, 11:54 AM

## 2014-10-12 NOTE — Anesthesia Postprocedure Evaluation (Signed)
Anesthesia Post Note  Patient: Cynthia [name]  Procedure(s) Performed: Procedure(s) (LRB): Left shoulder acromio-clavicular joint repair (Left)  Anesthesia type: General  Patient location: PACU  Post pain: Pain level controlled and Adequate analgesia  Post assessment: Post-op Vital signs reviewed, Patient's Cardiovascular Status Stable, Respiratory Function Stable, Patent Airway and Pain level controlled  Last Vitals:  Filed Vitals:   10/12/14 1530  BP: 91/53  Pulse: 92  Temp:   Resp: 22    Post vital signs: Reviewed and stable  Level of consciousness: awake, alert  and oriented  Complications: No apparent anesthesia complications

## 2014-10-12 NOTE — Op Note (Signed)
Procedure(s): Left shoulder acromio-clavicular joint repair Procedure Note  IVEE POELLNITZ female 44 y.o. 10/12/2014  Procedure(s) and Anesthesia Type:    #1 left shoulder repair acromioclavicular separation    #2 left shoulder distal clavicle excision  Surgeon(s) and Role:    * Mable Paris, MD - Primary   Indications:  44 y.o. female s/p fall out of bed with right grade 3 before meals separation. Indicated for surgery after failing conservative management to decrease pain and instability.     Surgeon: Mable Paris   Assistants: Damita Lack PA-C [facility] was present and scrubbed throughout the procedure and was essential in positioning, retraction, exposure, and closure)  Anesthesia: General endotracheal anesthesia with preoperative interscalene block given by the attending anesthesiologist    Procedure Detail  Left shoulder acromio-clavicular joint repair  Findings: Acromioclavicular joint repaired with Biomet zip tight back anatomic alignment with distal clavicle excision.  Estimated Blood Loss:  less than 50 mL         Drains: none  Blood Given: none         Specimens: none        Complications:  * No complications entered in OR log *         Disposition: PACU - hemodynamically stable.         Condition: stable    Procedure:  DESCRIPTION OF PROCEDURE: The patient was identified in preoperative  holding area where I personally marked the operative site after  verifying site, side, and procedure with the patient. The patient was taken back  to the operating room where general anesthesia was induced without  complication and was placed in the beach-chair position with the back  elevated about 40 degrees and all extremities carefully padded and  positioned.  The left upper extremity was then prepped and  draped in a standard sterile fashion. The appropriate time-out  procedure was carried out. The patient did receive IV  antibiotics  within 30 minutes of incision.  An incision was made in [employer] from the posterior aspect of the distal clavicle towards the coracoid approximately 5 cm. Dissection was carried down through subcutaneous tissues. The deltotrapezial fascia was opened in line with the distal clavicle and superior aspect of the distal clavicle was exposed. Careful dissection was made beneath the clavicle anteriorly to expose the dorsal aspect of the coracoid. Retractors were placed on either side of the coracoid and once verified with fluoroscopy the guidepin was advanced bicortically directly in the center of the coracoid. The 4.5 reamer was then used to ream bicortically and the button was passed and flipped. It was tensioned. Approximately 8 mm the distal clavicle was removed with an oscillating saw to prevent impingement once the joint reduced. The guidepin was then placed centrally in the distal clavicle and over reamed with the 4.5 mm reamer. The sutures were passed up through the clavicle and the small metallic button was placed dorsally. The joint was then held reduced with upward force on elbow and downward pressure on the distal clavicle while the sutures were tightened alternatively bringing the button down to the dorsal distal clavicle. Final fluoroscopic imaging showed anatomic reduction with appropriate cc distance. The pulling strands were then tied over the button dorsally. Wound is then copiously irrigated with normal saline and subsequently closed with 0 Vicryl closing the deltotrapezial fascia over the clavicle, 2-0 Vicryl in a deep dermal layer and 4-0 Vicryl for skin in a running subcuticular fashion. Steri-Strips as well as a light  sterile dressing was applied. Patient was allowed to awaken from anesthesia transferred to stretcher and taken to the recovery room in stable condition.  Postoperative plan: She will follow-up in my office in 10-14 days. She will remain in her sling for at least 4  weeks postoperatively.

## 2014-10-13 ENCOUNTER — Encounter (HOSPITAL_BASED_OUTPATIENT_CLINIC_OR_DEPARTMENT_OTHER): Payer: Self-pay | Admitting: Orthopedic Surgery

## 2014-11-09 ENCOUNTER — Telehealth: Payer: Self-pay

## 2014-11-09 NOTE — Telephone Encounter (Signed)
PLEASE NOTE: All timestamps contained within this report are represented as [place] Standard Time. CONFIDENTIALTY NOTICE: This fax transmission is intended only for the addressee. It contains information that is legally privileged, confidential or otherwise protected from use or disclosure. If you are not the intended recipient, you are strictly prohibited from reviewing, disclosing, copying using or disseminating any of this information or taking any action in reliance on or regarding this information. If you have received this fax in error, please notify us immediately by telephone so that we can arrange for its return to us. Phone: [phone], Toll-Free: [phone], Fax: [phone] Page: 1 of 1 Call Id: [id number] [facility] - Client TELEPHONE ADVICE RECORD [facility] Patient Name: Cynthia RadonHEATHER [name] Gender: Female DOB: 07/09/1971 Age: [age] Y 3 M 26 D Return Phone Number: [phone] (Primary) Address: [place]Zip[place] [id number] Client [facility] - Client Client Site [facility] Contact Type Call Call Type Triage / Clinical Relationship To Patient Self Return Phone Number [phone] (Primary) Chief Complaint Cough Initial Comment Caller states coughing, has runny nose, congestion, children are on Amoxicillin, wants to know if she can take it Nurse Assessment Guidelines Guideline Title Affirmed Question Affirmed Notes Nurse Date/Time (Eastern Time) Disp. Time Lamount Cohen(Eastern Time) Disposition Final User 11/06/2014 3:01:48 PM Attempt made - no message left Vivianne MasterKing-Hussey, Berdi 11/06/2014 3:02:11 PM Send To Extended Follow Up Vivianne MasterKing-Hussey, Berdi 11/06/2014 10:42:01 PM Attempt made - message left Burress, RN, Misty StanleyLisa 11/06/2014 11:02:44 PM FINAL ATTEMPT MADE - message left Yes Burress, RN, Misty StanleyLisa After Care Instructions Given Call Event Type User Date / Time DescriptionPLEASE NOTE: All  timestamps contained within this report are represented as [place] Standard Time. CONFIDENTIALTY NOTICE: This fax transmission is intended only for the addressee. It contains information that is legally privileged, confidential or otherwise protected from use or disclosure. If you are not the intended recipient, you are strictly prohibited from reviewing, disclosing, copying using or disseminating any of this information or taking any action in reliance on or regarding this information. If you have received this fax in error, please notify us immediately by telephone so that we can arrange for its return to us. Phone: [phone], Toll-Free: [phone], Fax: [phone] Page: 1 of 1 Call Id: [id number] [facility] - Client TELEPHONE ADVICE RECORD [facility] Patient Name: Cynthia [name] Gender: Female DOB: 11/21/1970 Age: [age] Y 3 M 24 D Return Phone Number: [phone] (Primary) Address: [place]Zip: [place] [address] Client [facility] - Client Client Site  [facility] Contact Type Call Call Type Triage / Clinical Relationship To Patient Self Return Phone Number [phone] (Primary) Chief Complaint Cough Initial Comment Caller states coughing, has runny nose, congestion, children are on Amoxicillin, wants to know if she can take it Nurse Assessment Guidelines Guideline Title Affirmed Question Affirmed Notes Nurse Date/Time (Eastern Time) Disp. Time Lamount Cohen(Eastern Time) Disposition Final User 11/06/2014 3:01:48 PM Attempt made - no message left Vivianne MasterKing-Hussey, Berdi 11/06/2014 3:02:11 PM Send To Extended Follow Up Vivianne MasterKing-Hussey, Berdi 11/06/2014 10:42:01 PM Attempt made - message left Burress, RN, Misty StanleyLisa 11/06/2014 11:02:44 PM FINAL ATTEMPT MADE - message left Yes Burress, RN, Misty StanleyLisa After Care Instructions Given Call Event Type User Date / Time Description

## 2014-11-16 ENCOUNTER — Other Ambulatory Visit: Payer: Self-pay | Admitting: Neurology

## 2014-11-16 ENCOUNTER — Telehealth: Payer: Self-pay

## 2014-11-16 DIAGNOSIS — G47411 Narcolepsy with cataplexy: Secondary | ICD-10-CM

## 2014-11-16 MED ORDER — AMPHETAMINE-DEXTROAMPHETAMINE 30 MG PO TABS: 30.0000 mg | ORAL_TABLET | Freq: Two times a day (BID) | ORAL | Status: DC

## 2014-11-16 NOTE — Telephone Encounter (Signed)
Patient requesting refill for Rx amphetamine-dextroamphetamine (ADDERALL) 30 MG tablet.  Please call when ready for pick up.

## 2014-11-16 NOTE — Telephone Encounter (Signed)
Request entered, forwarded to provider for approval.  

## 2014-11-16 NOTE — Telephone Encounter (Signed)
Spoke with patient and notified her that her Adderall is ready to pick up. She is aware of office hours.

## 2014-11-27 ENCOUNTER — Other Ambulatory Visit: Payer: Self-pay | Admitting: Family

## 2014-12-14 ENCOUNTER — Other Ambulatory Visit: Payer: Self-pay | Admitting: Neurology

## 2014-12-14 ENCOUNTER — Telehealth: Payer: Self-pay

## 2014-12-14 DIAGNOSIS — G47411 Narcolepsy with cataplexy: Secondary | ICD-10-CM

## 2014-12-14 IMAGING — CT CT ABD-PELV W/ CM
2 of 5 series · 16 of 46 positions shown, 18 images · IV contrast (omnipaque)
Comparison: None.

CLINICAL DATA: Right lower quadrant abdominal pain, nausea and
vomiting for 1 day.

CT ABDOMEN AND PELVIS WITH CONTRAST
TECHNIQUE: Multidetector CT imaging of the abdomen and pelvis was
performed following the standard protocol during bolus
administration of intravenous contrast.
Contrast: 50mL OMNIPAQUE IOHEXOL 300 MG/ML  SOLN, 100mL OMNIPAQUE
IOHEXOL 300 MG/ML  SOLN

[Series 2: abd/pelvis 5.0 b31f · axial · 0.68mm/px · z∈[+739,+1094]mm · 13 of 81 slices shown, 15 images]
[im 5/81  soft-tissue]
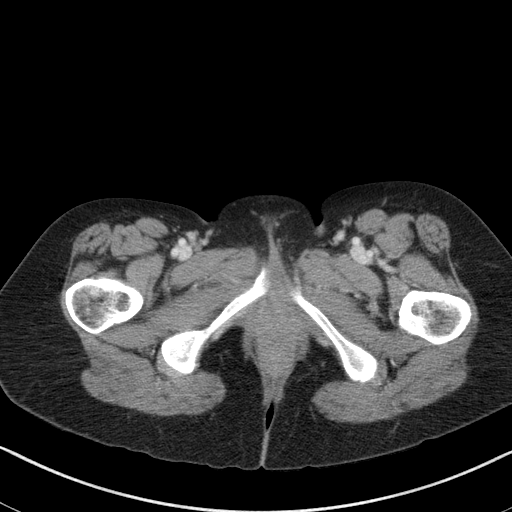
[im 5/81  bone]
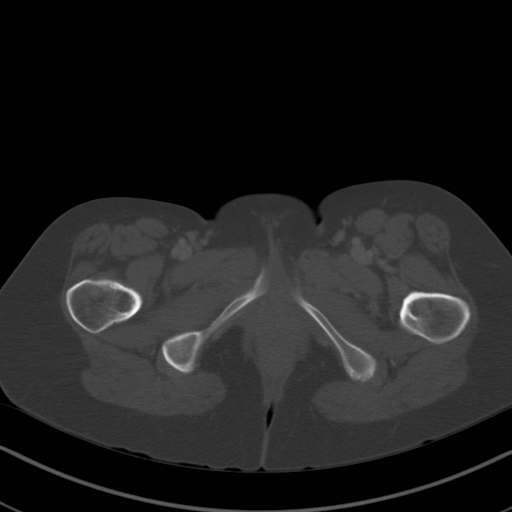
[im 13/81  soft-tissue]
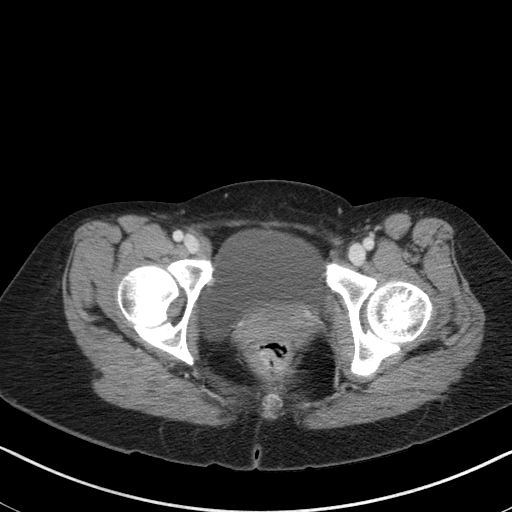
[im 17/81  soft-tissue]
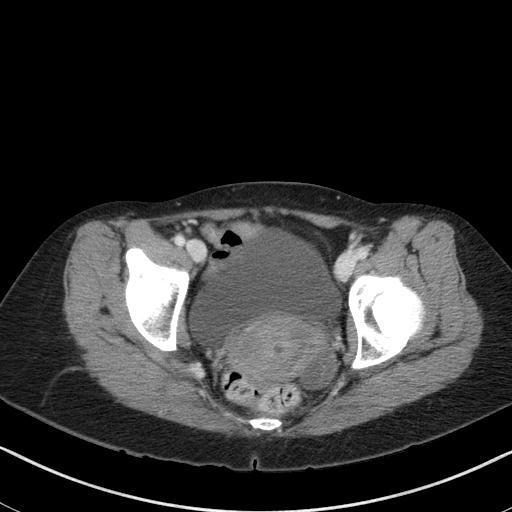
[im 22/81  soft-tissue]
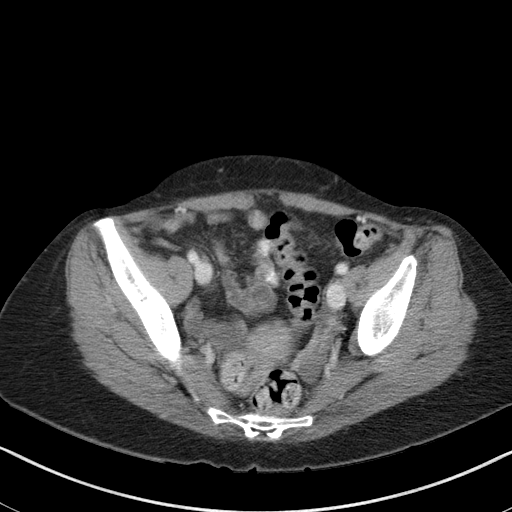
[im 30/81  soft-tissue]
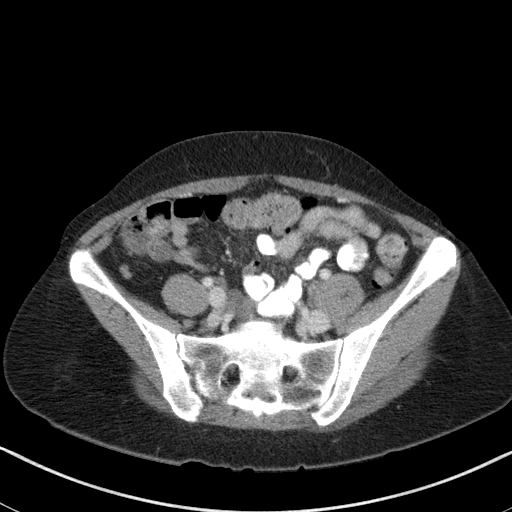
[im 34/81  soft-tissue]
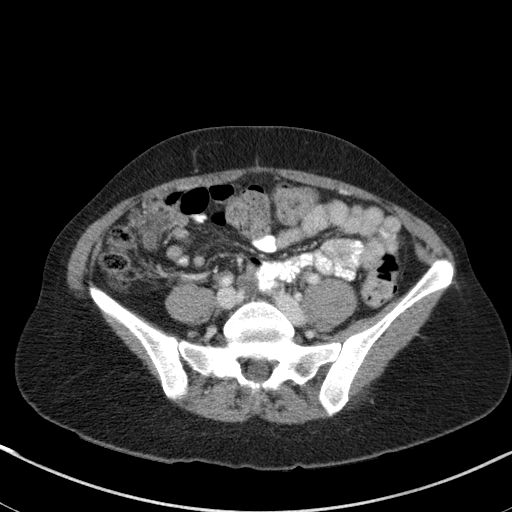
[im 43/81  soft-tissue]
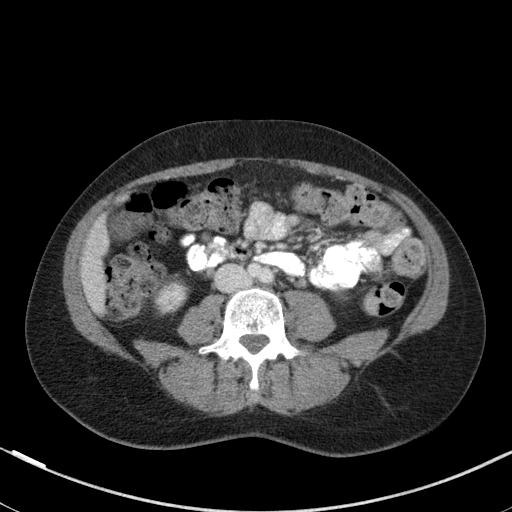
[im 47/81  soft-tissue]
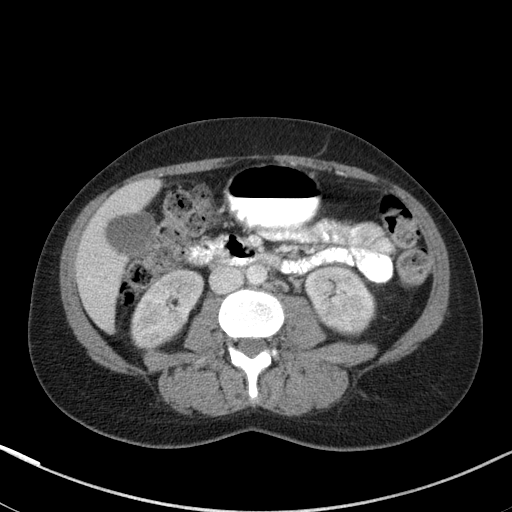
[im 51/81  soft-tissue]
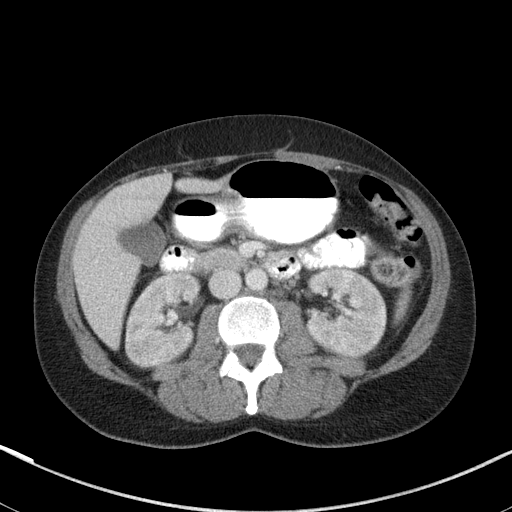
[im 51/81  bone]
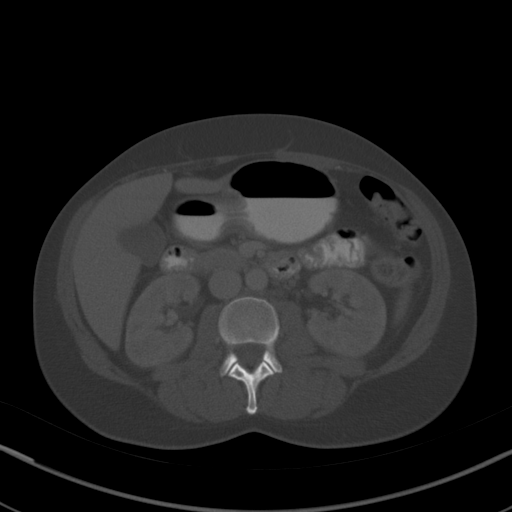
[im 59/81  soft-tissue]
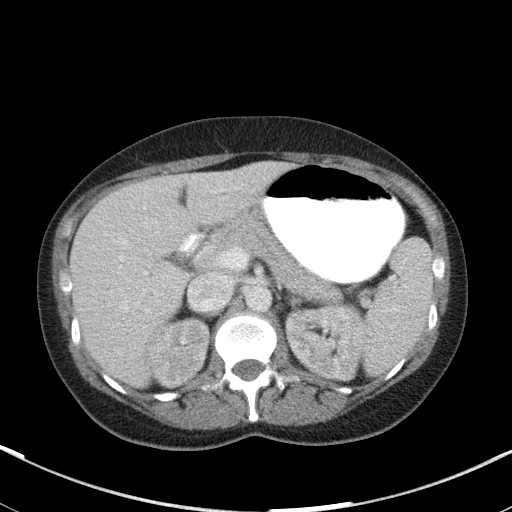
[im 64/81  soft-tissue]
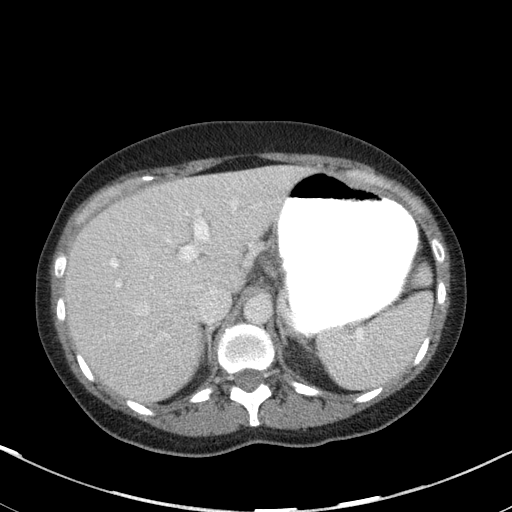
[im 68/81  soft-tissue]
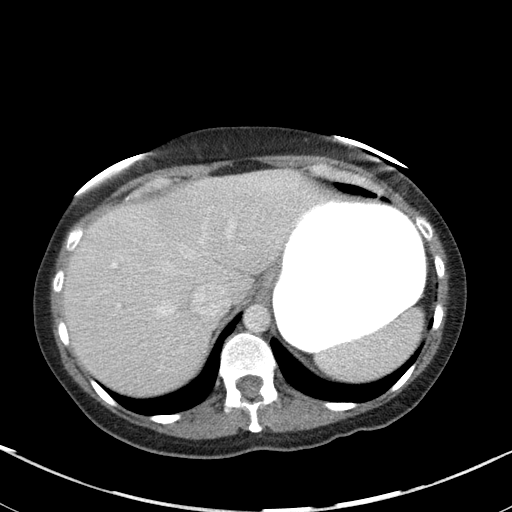
[im 76/81  soft-tissue]
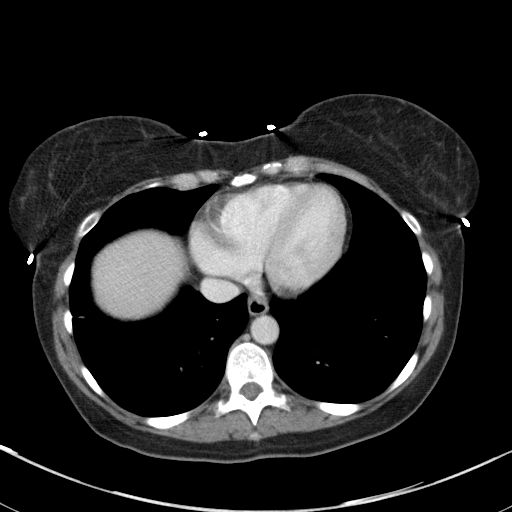

[Series 5: abd/pelvis 3.0 coronal · coronal · 0.63mm/px · 3 of 81 slices shown]
[im 27/81  soft-tissue]
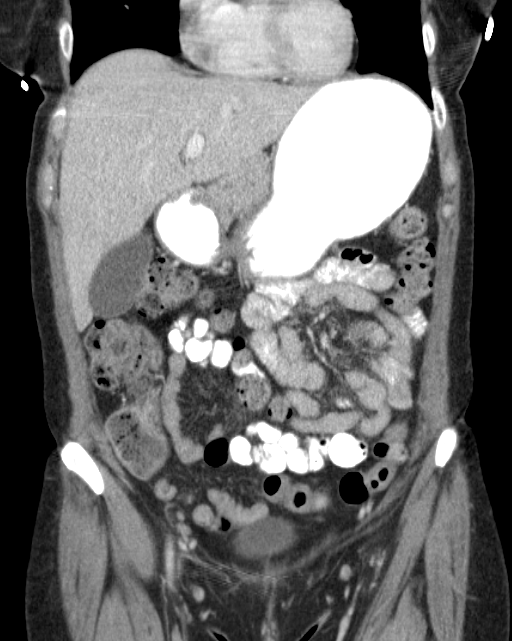
[im 36/81  soft-tissue]
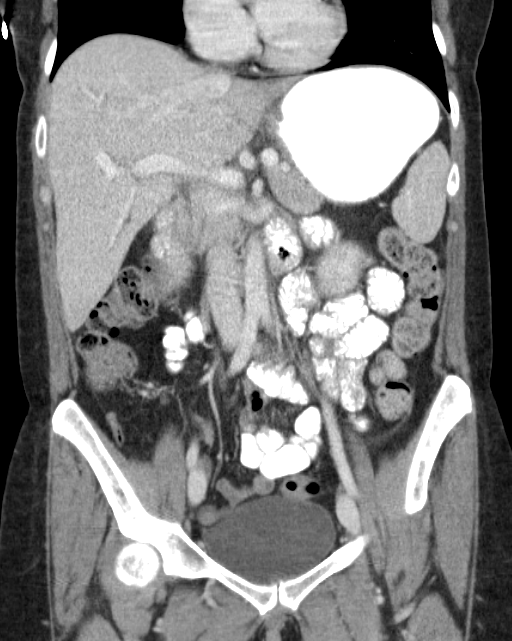
[im 45/81  soft-tissue]
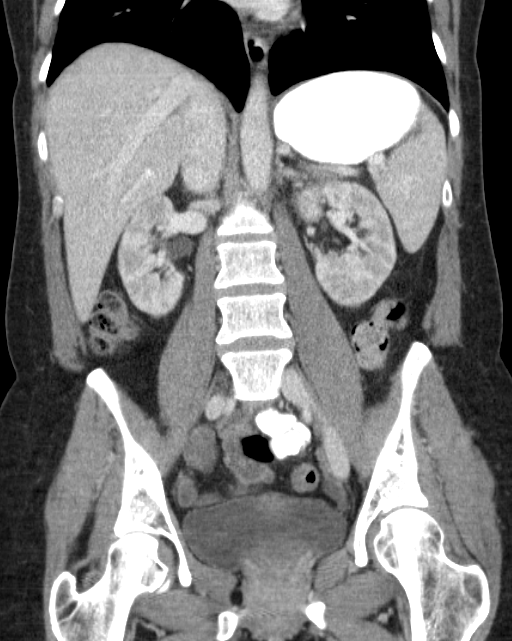

[16 of 46 positions shown; findings below may reference images not displayed]

FINDINGS: Minimal scarring in the right lung base.

The liver, spleen, gallbladder, pancreas, adrenal glands, abdominal
aorta, inferior vena cava, and retroperitoneal lymph nodes are
unremarkable.  The stomach is filled with contrast material.  No
gastric wall thickening or outlet obstruction is suggested.  Small
bowel are mostly decompressed.  Diffusely stool filled colon
without distension.  No free air or free fluid.  Abdominal wall
musculature appears intact.  The kidneys demonstrate a
heterogeneous nephrogram bilaterally.  This pattern suggests
pyelonephritis.  No solid mass or hydronephrosis is suggested.

Pelvis:  The uterus and ovaries do not appear enlarged.  Bladder
wall is not thickened.  Rectosigmoid colon is unremarkable.  No
evidence of diverticulitis.  The appendix is normal.  No
significant pelvic lymphadenopathy.  Normal alignment of the lumbar
vertebrae.
IMPRESSION: Heterogeneous nephrograms and both kidneys suggest pyelonephritis.
Appendix is normal.  Diffusely stool filled colon.

## 2014-12-14 MED ORDER — AMPHETAMINE-DEXTROAMPHETAMINE 30 MG PO TABS: 30.0000 mg | ORAL_TABLET | Freq: Two times a day (BID) | ORAL | Status: DC

## 2014-12-14 NOTE — Telephone Encounter (Signed)
Patient called and stated that she needed a refill on Rx. amphetamine-dextroamphetamine (ADDERALL) 30 MG tablet. Please call and advise.

## 2014-12-14 NOTE — Telephone Encounter (Signed)
Let message that (Adderal) Rx is ready to pick up at our front desk and to bring photo ID>

## 2014-12-14 NOTE — Telephone Encounter (Signed)
Request entered, forwarded to provider for approval.  

## 2014-12-29 ENCOUNTER — Encounter: Payer: Self-pay | Admitting: Nurse Practitioner

## 2014-12-29 ENCOUNTER — Ambulatory Visit (INDEPENDENT_AMBULATORY_CARE_PROVIDER_SITE_OTHER): Payer: 59 | Admitting: Nurse Practitioner

## 2014-12-29 ENCOUNTER — Other Ambulatory Visit: Payer: Self-pay

## 2014-12-29 ENCOUNTER — Telehealth: Payer: Self-pay

## 2014-12-29 VITALS — BP 98/65 | HR 76 | Ht 60.0 in | Wt 146.0 lb

## 2014-12-29 DIAGNOSIS — G47419 Narcolepsy without cataplexy: Secondary | ICD-10-CM | POA: Diagnosis not present

## 2014-12-29 MED ORDER — SODIUM OXYBATE 500 MG/ML PO SOLN: ORAL | Status: DC

## 2014-12-29 NOTE — Telephone Encounter (Signed)
Previous note says: Xyrem is working well. She felt too sedated on the 7.5 g total dose. Therefore, she has reduced to 3.75 g for the first dose around 9 PM and 3 g for the second dose

## 2014-12-29 NOTE — Telephone Encounter (Signed)
Left message that Xyrem Rx does not need to be picked up and will fax to pharmacy.

## 2014-12-29 NOTE — Telephone Encounter (Signed)
I spoke with patient and she is aware that we have a Rx of Xyrem ready to pick up at the front desk.
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
## 2014-12-29 NOTE — Telephone Encounter (Signed)
Called patient to advise we can forward Xyrem Rx to specialty program for her, so she does not have to pick up written Rx at our office.  Got no answer.  Left message.

## 2014-12-29 NOTE — Patient Instructions (Addendum)
Restart Xyrem, will have to titrate slowly. Titrate as instructed: 2.25gms twice nightly for 1 week then 3 gms twice nightly for 1 week, then 3.75gms for 1st nightly dose and 3 gms for 2nd nightly dose. (Once titrated total dose 6.75 grams nightly) Make sure you're in the bed ready to go to sleep when you take the medication Continue Adderall at current dose Follow-up with Dr. Frances FurbishAthar in 3 months

## 2015-01-06 ENCOUNTER — Telehealth: Payer: Self-pay | Admitting: Neurology

## 2015-01-06 NOTE — Telephone Encounter (Signed)
Info has been re-faxed.  I called the patient to advise.  Got no answer.  Left message.

## 2015-01-06 NOTE — Telephone Encounter (Signed)
A 30 day Rx for this med was last written on 05/02.  It is a few days too soon to refill at this time.  I called back.  Got no answer.  Left message.

## 2015-01-06 NOTE — Telephone Encounter (Signed)
Pt called and requested a medication refill for Rx. amphetamine-dextroamphetamine (ADDERALL) 30 MG tablet. Informed the pt that the medication would be ready to pick up within 24 hours unless informed otherwise.

## 2015-01-06 NOTE — Telephone Encounter (Signed)
Patient called and stated that the pharmacy never received the fax. Please call and advise.

## 2015-01-11 ENCOUNTER — Other Ambulatory Visit: Payer: Self-pay | Admitting: Family

## 2015-01-12 ENCOUNTER — Other Ambulatory Visit: Payer: Self-pay

## 2015-01-12 DIAGNOSIS — G47411 Narcolepsy with cataplexy: Secondary | ICD-10-CM

## 2015-01-12 MED ORDER — AMPHETAMINE-DEXTROAMPHETAMINE 30 MG PO TABS: 30.0000 mg | ORAL_TABLET | Freq: Two times a day (BID) | ORAL | Status: DC

## 2015-01-12 NOTE — Telephone Encounter (Signed)
Rx was already faxed to [facility] ([facility] is the dispensing facility).  I called them.  Spoke with Lyla Sonarrie who verified they did receive the Rx.  They had Dr Frances FurbishAthar as an inactive Xyrem provider in their system.  I explained she is an active Occupational psychologistXyrem Provider.  They will update this info.  I have also spoken with Brett CanalesSteve our Xyrem Rep, and explained the info I was provided.  He verified Dr Frances FurbishAthar did enroll, he is unsure why they marked her inactive.  Says he will follow up with [facility] and will call me back if there are any issues with reactivating her.  I called the patient back, got no answer.  Left message.

## 2015-01-12 NOTE — Telephone Encounter (Signed)
Request has been forwarded to provider for approval, currently pending.  I called back to advise.  Got no answer.  Left message.

## 2015-01-12 NOTE — Telephone Encounter (Signed)
Patient called stating that [facility] have not received the refill for Xyrem. Patient can be reached @ [phone]

## 2015-01-12 NOTE — Telephone Encounter (Signed)
Patient called wanting to follow up and see if her script for ADDERALL was ready for pick up. Please call and advise. Patient can be reached @ [phone]

## 2015-01-13 ENCOUNTER — Telehealth: Payer: Self-pay

## 2015-01-13 NOTE — Telephone Encounter (Signed)
Left message that Rx (adderall) is ready to pick up at front desk.

## 2015-01-18 ENCOUNTER — Telehealth: Payer: Self-pay | Admitting: Family

## 2015-01-18 MED ORDER — DULOXETINE HCL 30 MG PO CPEP: ORAL_CAPSULE | ORAL | Status: DC

## 2015-01-18 NOTE — Telephone Encounter (Addendum)
lmom for pt that med has been call into pharm

## 2015-01-18 NOTE — Telephone Encounter (Signed)
Done

## 2015-01-18 NOTE — Telephone Encounter (Signed)
Pt has an appt on 01-22-15  And needs cymbalta today. Pt has been crying.[facility] [address]

## 2015-01-22 ENCOUNTER — Ambulatory Visit: Payer: Self-pay | Admitting: Family

## 2015-01-29 ENCOUNTER — Encounter: Payer: Self-pay | Admitting: Family

## 2015-01-29 ENCOUNTER — Ambulatory Visit (INDEPENDENT_AMBULATORY_CARE_PROVIDER_SITE_OTHER): Payer: 59 | Admitting: Family

## 2015-01-29 VITALS — BP 128/76 | HR 123 | Temp 98.1°F | Wt 147.7 lb

## 2015-01-29 DIAGNOSIS — F411 Generalized anxiety disorder: Secondary | ICD-10-CM

## 2015-01-29 DIAGNOSIS — G47419 Narcolepsy without cataplexy: Secondary | ICD-10-CM

## 2015-01-29 MED ORDER — DULOXETINE HCL 60 MG PO CPEP: 60.0000 mg | ORAL_CAPSULE | Freq: Two times a day (BID) | ORAL | Status: DC

## 2015-01-29 MED ORDER — CLONAZEPAM 0.5 MG PO TABS: 0.5000 mg | ORAL_TABLET | Freq: Three times a day (TID) | ORAL | Status: DC | PRN

## 2015-01-29 NOTE — Patient Instructions (Signed)
Stress and Stress Management Stress is a normal reaction to life events. It is what you feel when life demands more than you are used to or more than you can handle. Some stress can be useful. For example, the stress reaction can help you catch the last bus of the day, study for a test, or meet a deadline at work. But stress that occurs too often or for too long can cause problems. It can affect your emotional health and interfere with relationships and normal daily activities. Too much stress can weaken your immune system and increase your risk for physical illness. If you already have a medical problem, stress can make it worse. CAUSES  All sorts of life events may cause stress. An event that causes stress for one person may not be stressful for another person. Major life events commonly cause stress. These may be positive or negative. Examples include losing your job, moving into a new home, getting married, having a baby, or losing a loved one. Less obvious life events may also cause stress, especially if they occur day after day or in combination. Examples include working long hours, driving in traffic, caring for children, being in debt, or being in a difficult relationship. SIGNS AND SYMPTOMS Stress may cause emotional symptoms including, the following:  Anxiety. This is feeling worried, afraid, on edge, overwhelmed, or out of control.  Anger. This is feeling irritated or impatient.  Depression. This is feeling sad, down, helpless, or guilty.  Difficulty focusing, remembering, or making decisions. Stress may cause physical symptoms, including the following:   Aches and pains. These may affect your head, neck, back, stomach, or other areas of your body.  Tight muscles or clenched jaw.  Low energy or trouble sleeping. Stress may cause unhealthy behaviors, including the following:   Eating to feel better (overeating) or skipping meals.  Sleeping too little, too much, or both.  Working  too much or putting off tasks (procrastination).  Smoking, drinking alcohol, or using drugs to feel better. DIAGNOSIS  Stress is diagnosed through an assessment by your health care provider. Your health care provider will ask questions about your symptoms and any stressful life events.Your health care provider will also ask about your medical history and may order blood tests or other tests. Certain medical conditions and medicine can cause physical symptoms similar to stress. Mental illness can cause emotional symptoms and unhealthy behaviors similar to stress. Your health care provider may refer you to a mental health professional for further evaluation.  TREATMENT  Stress management is the recommended treatment for stress.The goals of stress management are reducing stressful life events and coping with stress in healthy ways.  Techniques for reducing stressful life events include the following:  Stress identification. Self-monitor for stress and identify what causes stress for you. These skills may help you to avoid some stressful events.  Time management. Set your priorities, keep a calendar of events, and learn to say "no." These tools can help you avoid making too many commitments. Techniques for coping with stress include the following:  Rethinking the problem. Try to think realistically about stressful events rather than ignoring them or overreacting. Try to find the positives in a stressful situation rather than focusing on the negatives.  Exercise. Physical exercise can release both physical and emotional tension. The key is to find a form of exercise you enjoy and do it regularly.  Relaxation techniques. These relax the body and mind. Examples include yoga, meditation, tai chi, biofeedback, deep  breathing, progressive muscle relaxation, listening to music, being out in nature, journaling, and other hobbies. Again, the key is to find one or more that you enjoy and can do  regularly.  Healthy lifestyle. Eat a balanced diet, get plenty of sleep, and do not smoke. Avoid using alcohol or drugs to relax.  Strong support network. Spend time with family, friends, or other people you enjoy being around.Express your feelings and talk things over with someone you trust. Counseling or talktherapy with a mental health professional may be helpful if you are having difficulty managing stress on your own. Medicine is typically not recommended for the treatment of stress.Talk to your health care provider if you think you need medicine for symptoms of stress. HOME CARE INSTRUCTIONS  Keep all follow-up visits as directed by your health care provider.  Take all medicines as directed by your health care provider. SEEK MEDICAL CARE IF:  Your symptoms get worse or you start having new symptoms.  You feel overwhelmed by your problems and can no longer manage them on your own. SEEK IMMEDIATE MEDICAL CARE IF:  You feel like hurting yourself or someone else. Document Released: 01/24/2001 Document Revised: 12/15/2013 Document Reviewed: 03/25/2013 [employer] Patient Information 2015 [employer]. This information is not intended to replace advice given to you by your health care provider. Make sure you discuss any questions you have with your health care provider.  

## 2015-01-29 NOTE — Progress Notes (Signed)
Subjective:    Patient ID: Cynthia [name], female    DOB: August 02, 1971, 44 y.o.   MRN: [id number]  HPI 44 year old white female, former smoker with a history of narcolepsy, anxiety, skin picking disorder is in today for recheck. Is requesting an increase in Cymbalta back to 120 mg daily. She's currently on 90 mg but continues to have anxiety. Sees a therapist on a regular basis. Is considering hypnotherapy for the skin picking. She stop smoking under hypnotherapy. Denies any feelings of helplessness, hopelessness, thoughts of death or dying. She sees the sleep center for management of narcolepsy and is on Adderall for management.   Review of Systems  Constitutional: Negative.   Respiratory: Negative.   Cardiovascular: Negative.   Gastrointestinal: Negative.   Endocrine: Negative.   Genitourinary: Negative.   Musculoskeletal: Negative.   Skin: Positive for wound.       Skin picking   Allergic/Immunologic: Negative.   Neurological: Negative.   Psychiatric/Behavioral: Negative for sleep disturbance. The patient is nervous/anxious.    Past Medical History  Diagnosis Date  . Narcolepsy   . Anxiety   . Complication of anesthesia   . PONV (postoperative nausea and vomiting)     nausea  . Migraines   . GERD (gastroesophageal reflux disease)     Gaviscon as needed  . Dental crowns present   . Carpal tunnel syndrome of right wrist   . AC separation 09/2014    left shoulder    History   Social History  . Marital Status: Married    Spouse Name: [name]  . Number of Children: 2  . 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 Smoking status: Former Smoker -- 0.00 packs/day for 0 years    Quit date: 08/13/2005  . Smokeless tobacco: Never Used  . Alcohol Use: No  . Drug Use: No  . Sexual Activity: Not on file   Other Topics Concern  . Not on file   Social History Narrative   Pt lives at home with husband [name] and two children   Pt is right handed    Education - Bachelors degree   Caffeine 1-2 cups a day    Past Surgical History  Procedure Laterality Date  . Bunionectomy Bilateral   . Cesarean section  10/14/2005; 08/23/2007  . Eus N/A 01/22/2014    Procedure: UPPER ENDOSCOPIC ULTRASOUND (EUS) LINEAR;  Surgeon: Rachael Fee, MD;  Location: [facility];  Service: Endoscopy;  Laterality: N/A;  . Laparoscopic lysis of adhesions  04/30/2009  . Laparoscopic supracervical hysterectomy  07/26/2009  . Laparoscopic unilateral salpingo oopherectomy Right 07/26/2009  . Cystoscopy  07/26/2009  . Tubal ligation  08/23/2007  . Laparoscopic ovarian cystectomy Left 11/14/2002  . Endometrial ablation  11/14/2002  . Acromio-clavicular joint repair Left 10/12/2014    Procedure: Left shoulder acromio-clavicular joint repair;  Surgeon: Mable Paris, MD;  Location: [facility];  Service: Orthopedics;  Laterality: Left;  Left shoulder acromio-clavicular joint repair    Family History  Problem Relation Age of Onset  . Prostate cancer Maternal Grandfather   . Kidney disease Sister   . Heart disease Paternal Grandfather     Allergies  Allergen Reactions  . Meclizine Hcl Other (See Comments)    SEVERE HEARTBURN  . Morphine Itching  . Percocet [Oxycodone-Acetaminophen] Itching    Current Outpatient Prescriptions on File Prior to Visit  Medication Sig  Dispense Refill  . amphetamine-dextroamphetamine (ADDERALL) 30 MG tablet Take 1 tablet by mouth 2 (two) times daily. Take second dose no later than 3 PM 60 tablet 0  . Sodium Oxybate (XYREM) 500 MG/ML SOLN Titrate as instructed: 2.25gms twice nightly for 1 week then 3 gms twice nightly for 1 week, then 3.75gms for 1st nightly dose and 3 gms for 2nd nightly dose.  (Once titrated total dose 6.75 grams nightly) 270 mL 5   No current facility-administered medications on file prior to visit.    BP 128/76 mmHg  Pulse 123  Temp(Src) 98.1  F (36.7 C) (Oral)  Wt 147 lb 11.2 oz (66.996 kg)chart    Objective:   Physical Exam  Constitutional: She is oriented to person, place, and time. She appears well-developed and well-nourished.  HENT:  Right Ear: External ear normal.  Left Ear: External ear normal.  Nose: Nose normal.  Mouth/Throat: Oropharynx is clear and moist.  Neck: Normal range of motion. Neck supple.  Cardiovascular: Normal rate, regular rhythm and normal heart sounds.   Pulmonary/Chest: Effort normal.  Abdominal: Soft. Bowel sounds are normal.  Musculoskeletal: Normal range of motion.  Neurological: She is alert and oriented to person, place, and time.  Skin: Skin is warm and dry.  Obvious skin picking noted to the chest and upper arms.   Psychiatric: She has a normal mood and affect.          Assessment & Plan:  Whisper was seen today for follow-up.  Diagnoses and all orders for this visit:  Generalized anxiety disorder  Narcolepsy  Other orders -     clonazePAM (KLONOPIN) 0.5 MG tablet; Take 1 tablet (0.5 mg total) by mouth 3 (three) times daily as needed. for anxiety -     DULoxetine (CYMBALTA) 60 MG capsule; Take 1 capsule (60 mg total) by mouth 2 (two) times daily.   Continue current medications. Increase Cymbalta to 120 mg once daily. Recheck in 6 weeks. Renew Klonopin. Call the office with any questions or concerns.

## 2015-01-29 NOTE — Progress Notes (Signed)
Pre visit review using our clinic review tool, if applicable. No additional management support is needed unless otherwise documented below in the visit note. 

## 2015-02-09 ENCOUNTER — Other Ambulatory Visit: Payer: Self-pay | Admitting: Neurology

## 2015-02-09 DIAGNOSIS — G47411 Narcolepsy with cataplexy: Secondary | ICD-10-CM

## 2015-02-09 MED ORDER — AMPHETAMINE-DEXTROAMPHETAMINE 30 MG PO TABS: 30.0000 mg | ORAL_TABLET | Freq: Two times a day (BID) | ORAL | Status: DC

## 2015-02-09 NOTE — Telephone Encounter (Signed)
Request entered, forwarded to provider for review.   Rx last written 05/31, patient is going out of town.

## 2015-02-09 NOTE — Telephone Encounter (Signed)
Patient called requesting refill for amphetamine-dextroamphetamine (ADDERALL) 30 MG tablet . Patient states she is going out of town on Thursday(02/11/15)and would like to pick it up on Thursday. Patient can be reached at [phone].

## 2015-02-10 ENCOUNTER — Telehealth: Payer: Self-pay

## 2015-02-10 NOTE — Telephone Encounter (Signed)
Left message that Rx is ready to p/u at front desk.

## 2015-03-10 ENCOUNTER — Other Ambulatory Visit: Payer: Self-pay | Admitting: Neurology

## 2015-03-10 ENCOUNTER — Telehealth: Payer: Self-pay

## 2015-03-10 DIAGNOSIS — G47411 Narcolepsy with cataplexy: Secondary | ICD-10-CM

## 2015-03-10 MED ORDER — AMPHETAMINE-DEXTROAMPHETAMINE 30 MG PO TABS: 30.0000 mg | ORAL_TABLET | Freq: Two times a day (BID) | ORAL | Status: DC

## 2015-03-10 NOTE — Telephone Encounter (Signed)
[facility] has confirmed the patient is enrolled with the current [facility].   

## 2015-03-10 NOTE — Telephone Encounter (Signed)
Patient called and requested a Rx. amphetamine-dextroamphetamine (ADDERALL) 30 MG tablet. Informed the patient it would be ready to pick up within 24 hours unless she hears otherwise from the nurse.

## 2015-03-10 NOTE — Telephone Encounter (Signed)
Request entered, forwarded to provider for approval.  

## 2015-03-11 ENCOUNTER — Telehealth: Payer: Self-pay

## 2015-03-11 NOTE — Telephone Encounter (Signed)
Left message that Rx(adderall) is ready to p/u at front desk

## 2015-04-01 ENCOUNTER — Ambulatory Visit: Payer: 59 | Admitting: Neurology

## 2015-04-02 ENCOUNTER — Encounter: Payer: Self-pay | Admitting: Neurology

## 2015-04-02 ENCOUNTER — Ambulatory Visit (INDEPENDENT_AMBULATORY_CARE_PROVIDER_SITE_OTHER): Payer: 59 | Admitting: Neurology

## 2015-04-02 VITALS — BP 110/61 | HR 80 | Resp 16 | Ht 61.0 in | Wt 148.0 lb

## 2015-04-02 DIAGNOSIS — G47411 Narcolepsy with cataplexy: Secondary | ICD-10-CM

## 2015-04-02 MED ORDER — LISDEXAMFETAMINE DIMESYLATE 30 MG PO CAPS: 30.0000 mg | ORAL_CAPSULE | Freq: Every day | ORAL | Status: DC

## 2015-04-02 MED ORDER — AMPHETAMINE-DEXTROAMPHETAMINE 10 MG PO TABS: 10.0000 mg | ORAL_TABLET | Freq: Every day | ORAL | Status: DC
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
## 2015-04-02 NOTE — Patient Instructions (Signed)
We will try to switch your stimulant therapy for your narcolepsy: we will stop the adderall 20 mg tablet, start Vyvanse 30 mg once daily in the morning, and you can use the adderall 10 mg as needed. We will continue with Xyrem a the current dose.

## 2015-04-27 ENCOUNTER — Telehealth: Payer: Self-pay | Admitting: Neurology

## 2015-04-27 ENCOUNTER — Encounter: Payer: Self-pay | Admitting: Neurology

## 2015-04-28 ENCOUNTER — Other Ambulatory Visit: Payer: Self-pay | Admitting: Neurology

## 2015-04-28 DIAGNOSIS — G47411 Narcolepsy with cataplexy: Secondary | ICD-10-CM

## 2015-04-28 MED ORDER — LISDEXAMFETAMINE DIMESYLATE 40 MG PO CAPS: 40.0000 mg | ORAL_CAPSULE | Freq: Every day | ORAL | Status: DC

## 2015-04-28 NOTE — Progress Notes (Signed)
Pt can pick up Rx for Vyvanse 40 mg, last Rx was 8/19, she may not be able to fill before then.

## 2015-04-28 NOTE — Telephone Encounter (Signed)
Left message that her Rx is ready to p/u at front desk.

## 2015-05-13 ENCOUNTER — Emergency Department (HOSPITAL_COMMUNITY)
Admission: EM | Admit: 2015-05-13 | Discharge: 2015-05-13 | Disposition: A | Discharge status: Home / Self Care | Payer: 59 | Attending: Emergency Medicine | Admitting: Emergency Medicine

## 2015-05-13 DIAGNOSIS — Y92410 Unspecified street and highway as the place of occurrence of the external cause: Secondary | ICD-10-CM | POA: Insufficient documentation

## 2015-05-13 DIAGNOSIS — G43909 Migraine, unspecified, not intractable, without status migrainosus: Secondary | ICD-10-CM | POA: Diagnosis not present

## 2015-05-13 DIAGNOSIS — Y998 Other external cause status: Secondary | ICD-10-CM | POA: Diagnosis not present

## 2015-05-13 DIAGNOSIS — Z79899 Other long term (current) drug therapy: Secondary | ICD-10-CM | POA: Diagnosis not present

## 2015-05-13 DIAGNOSIS — Z8669 Personal history of other diseases of the nervous system and sense organs: Secondary | ICD-10-CM | POA: Diagnosis not present

## 2015-05-13 DIAGNOSIS — S39012A Strain of muscle, fascia and tendon of lower back, initial encounter: Secondary | ICD-10-CM | POA: Diagnosis not present

## 2015-05-13 DIAGNOSIS — F419 Anxiety disorder, unspecified: Secondary | ICD-10-CM | POA: Diagnosis not present

## 2015-05-13 DIAGNOSIS — Z87891 Personal history of nicotine dependence: Secondary | ICD-10-CM | POA: Diagnosis not present

## 2015-05-13 DIAGNOSIS — Y9389 Activity, other specified: Secondary | ICD-10-CM | POA: Diagnosis not present

## 2015-05-13 DIAGNOSIS — S3992XA Unspecified injury of lower back, initial encounter: Secondary | ICD-10-CM | POA: Diagnosis present

## 2015-05-13 DIAGNOSIS — Z8719 Personal history of other diseases of the digestive system: Secondary | ICD-10-CM | POA: Insufficient documentation
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
## 2015-05-14 ENCOUNTER — Encounter: Payer: Self-pay | Admitting: Neurology

## 2015-05-14 ENCOUNTER — Telehealth: Payer: Self-pay | Admitting: *Deleted

## 2015-05-14 NOTE — Telephone Encounter (Signed)
LMTC.  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  Per Dr. Frances Furbish, it is too early for a refill, and if an increase is needed, the next dose would be  daily.  She is not comfortable with a bid dose of Vyvanse./fim

## 2015-05-25 ENCOUNTER — Encounter: Payer: Self-pay | Admitting: Neurology

## 2015-05-25 ENCOUNTER — Other Ambulatory Visit: Payer: Self-pay

## 2015-05-25 DIAGNOSIS — G47411 Narcolepsy with cataplexy: Secondary | ICD-10-CM

## 2015-05-25 MED ORDER — LISDEXAMFETAMINE DIMESYLATE 40 MG PO CAPS: 40.0000 mg | ORAL_CAPSULE | Freq: Every day | ORAL | Status: DC

## 2015-06-02 ENCOUNTER — Encounter: Payer: Self-pay | Admitting: Neurology

## 2015-06-07 ENCOUNTER — Encounter: Payer: Self-pay | Admitting: Neurology

## 2015-06-07 ENCOUNTER — Other Ambulatory Visit: Payer: Self-pay | Admitting: Neurology

## 2015-06-18 ENCOUNTER — Encounter: Payer: Self-pay | Admitting: Neurology

## 2015-06-18 ENCOUNTER — Telehealth: Payer: Self-pay | Admitting: Neurology

## 2015-06-18 DIAGNOSIS — G47411 Narcolepsy with cataplexy: Secondary | ICD-10-CM

## 2015-06-18 NOTE — Telephone Encounter (Signed)
For next Vyvanse Rx please increase to 50 mg once daily, see patient email too, thx

## 2015-06-21 ENCOUNTER — Telehealth: Payer: Self-pay

## 2015-06-21 DIAGNOSIS — G47419 Narcolepsy without cataplexy: Secondary | ICD-10-CM

## 2015-06-21 MED ORDER — LISDEXAMFETAMINE DIMESYLATE 50 MG PO CAPS: 50.0000 mg | ORAL_CAPSULE | Freq: Every day | ORAL | Status: DC

## 2015-06-21 NOTE — Telephone Encounter (Signed)
New Rx has been printed. Patient will be notified that refill is ready to p/u at front desk.

## 2015-06-21 NOTE — Telephone Encounter (Signed)
FYI

## 2015-06-21 NOTE — Telephone Encounter (Signed)
Thank you.  Rx has been updated on med list.

## 2015-07-20 ENCOUNTER — Telehealth: Payer: Self-pay

## 2015-07-20 ENCOUNTER — Encounter: Payer: Self-pay | Admitting: Neurology

## 2015-07-20 DIAGNOSIS — G47419 Narcolepsy without cataplexy: Secondary | ICD-10-CM

## 2015-07-20 MED ORDER — LISDEXAMFETAMINE DIMESYLATE 50 MG PO CAPS: 50.0000 mg | ORAL_CAPSULE | Freq: Every day | ORAL | Status: DC

## 2015-07-20 NOTE — Telephone Encounter (Signed)
I have sent an email back to the patient to let her know that her prescription is ready to pick up at our front desk after lunch time today.

## 2015-07-29 ENCOUNTER — Encounter: Payer: Self-pay | Admitting: Neurology

## 2015-07-29 ENCOUNTER — Ambulatory Visit (INDEPENDENT_AMBULATORY_CARE_PROVIDER_SITE_OTHER): Payer: 59 | Admitting: Neurology

## 2015-07-29 ENCOUNTER — Encounter: Payer: Self-pay | Admitting: Family

## 2015-07-29 VITALS — BP 104/72 | HR 80 | Resp 16 | Ht 61.0 in | Wt 149.0 lb

## 2015-07-29 DIAGNOSIS — G47411 Narcolepsy with cataplexy: Secondary | ICD-10-CM

## 2015-07-29 DIAGNOSIS — R51 Headache: Secondary | ICD-10-CM | POA: Diagnosis not present

## 2015-07-29 DIAGNOSIS — R519 Headache, unspecified: Secondary | ICD-10-CM

## 2015-07-29 MED ORDER — AMPHETAMINE-DEXTROAMPHETAMINE 30 MG PO TABS: 30.0000 mg | ORAL_TABLET | Freq: Two times a day (BID) | ORAL | Status: DC

## 2015-07-29 NOTE — Patient Instructions (Addendum)
We will switch you back on Adderall immediate release: let's put you back on 30 mg 2 times a day, second dose no later than 3 AM.   We will continue with Xyrem at the current dose, but we can increase in the future to 4.5 g for both doses.   As discussed, Xyrem has to be taken with very mindful caution: Taking Xyrem correctly is key. This means, take it only when you are fully ready to fall asleep, while in bed and refrain from doing any other activities, even brushing  your teeth after taking your first dose. The second dose will be about 2-1/2-4 hours after his first dose. You can go to the bathroom before your 2nd dose. Take your first dose, when actually IN BED, ready to sleep. No sitting up in bed, NO reading, NO using the cell phone or computer, NO getting up to use the bathroom. Take care of everything BEFORE sleep time. Try NOT to skip the second dose as the Xyrem is not going to stay in your system long enough with only one dose. Do not drink alcohol with Xyrem. If you do drink Alcohol, you cannot take your Xyrem doses that night.
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
## 2015-08-06 ENCOUNTER — Other Ambulatory Visit: Payer: Self-pay | Admitting: Family

## 2015-08-25 ENCOUNTER — Encounter: Payer: Self-pay | Admitting: Neurology

## 2015-08-25 ENCOUNTER — Other Ambulatory Visit: Payer: Self-pay

## 2015-08-25 DIAGNOSIS — G47411 Narcolepsy with cataplexy: Secondary | ICD-10-CM

## 2015-08-25 MED ORDER — AMPHETAMINE-DEXTROAMPHETAMINE 30 MG PO TABS: 30.0000 mg | ORAL_TABLET | Freq: Two times a day (BID) | ORAL | Status: DC

## 2015-09-22 ENCOUNTER — Encounter: Payer: Self-pay | Admitting: Neurology

## 2015-09-23 ENCOUNTER — Other Ambulatory Visit: Payer: Self-pay

## 2015-09-23 DIAGNOSIS — G47411 Narcolepsy with cataplexy: Secondary | ICD-10-CM

## 2015-09-23 MED ORDER — AMPHETAMINE-DEXTROAMPHETAMINE 30 MG PO TABS: 30.0000 mg | ORAL_TABLET | Freq: Two times a day (BID) | ORAL | Status: DC

## 2015-09-26 ENCOUNTER — Encounter: Payer: Self-pay | Admitting: Neurology

## 2015-10-21 ENCOUNTER — Encounter: Payer: Self-pay | Admitting: Neurology

## 2015-10-21 ENCOUNTER — Telehealth: Payer: Self-pay

## 2015-10-21 DIAGNOSIS — G47411 Narcolepsy with cataplexy: Secondary | ICD-10-CM

## 2015-10-21 MED ORDER — AMPHETAMINE-DEXTROAMPHETAMINE 30 MG PO TABS: 30.0000 mg | ORAL_TABLET | Freq: Two times a day (BID) | ORAL | Status: DC

## 2015-10-21 NOTE — Telephone Encounter (Signed)
Rx printed and left for Dr. Frances FurbishAthar to sign. I will let patient know when ready and put at front desk.

## 2015-11-11 ENCOUNTER — Telehealth: Payer: Self-pay

## 2015-11-11 MED ORDER — SODIUM OXYBATE 500 MG/ML PO SOLN: ORAL | Status: DC

## 2015-11-11 NOTE — Telephone Encounter (Signed)
Refill request for Xyrem. Rx faxed to [phone]

## 2015-11-14 ENCOUNTER — Other Ambulatory Visit: Payer: Self-pay | Admitting: Family

## 2015-11-15 IMAGING — US US ABDOMEN LIMITED
1 series · 14 of 25 positions shown · non-contrast
Comparison: None.

CLINICAL DATA: RUQ pain

EXAM:
US ABDOMEN LIMITED - RIGHT UPPER QUADRANT

[Series 1: us abdomen limited · 0.22mm/px · 14 of 41 slices shown]
[im 1/41]
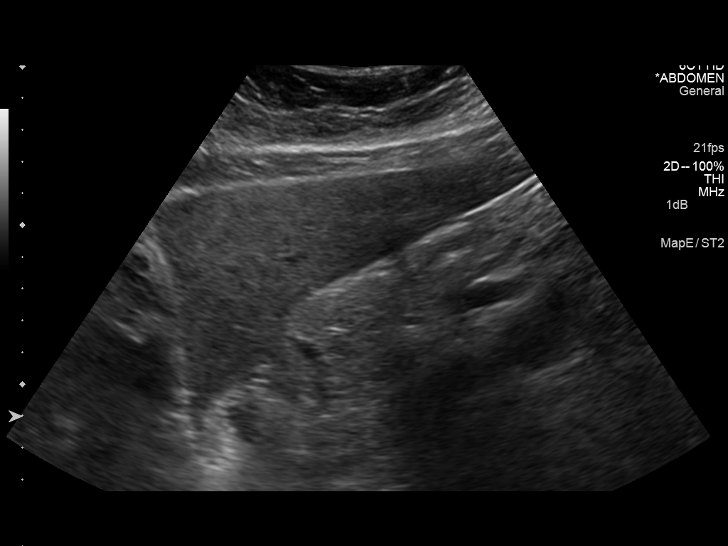
[im 4/41]
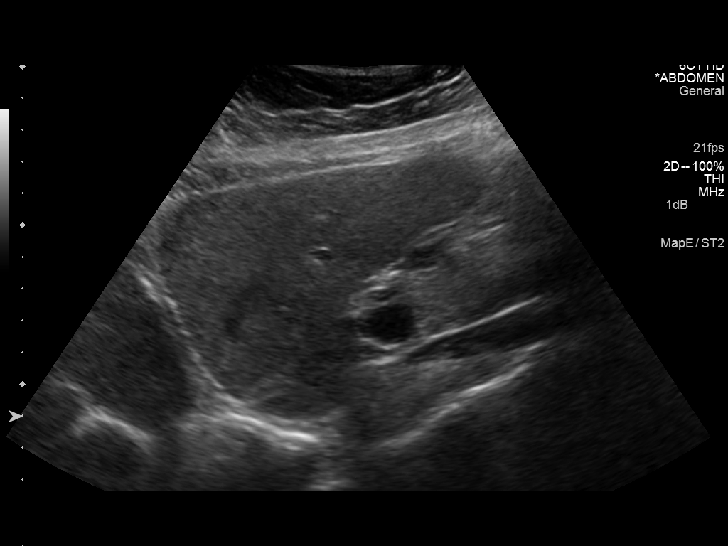
[im 7/41]
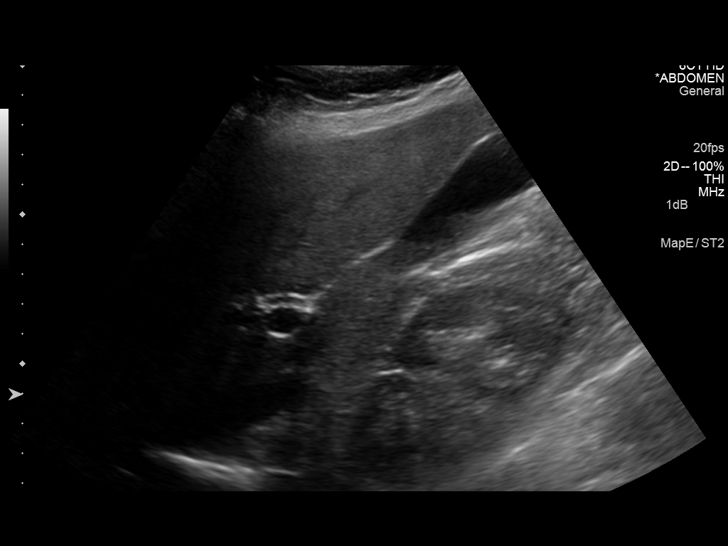
[im 11/41]
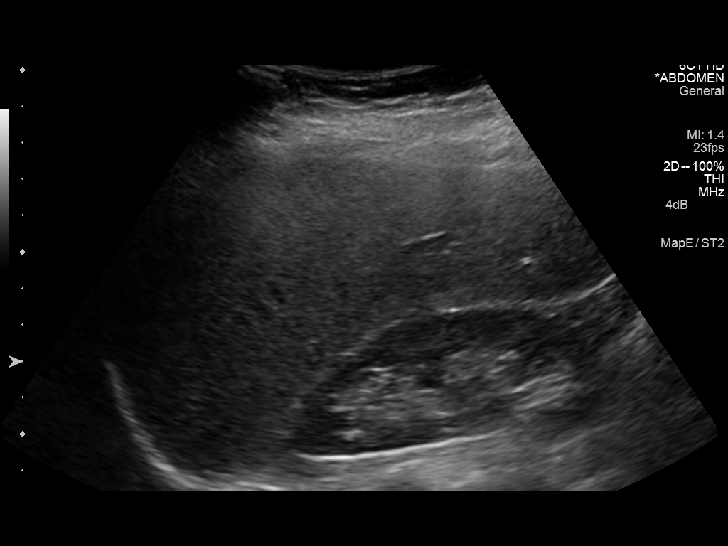
[im 14/41]
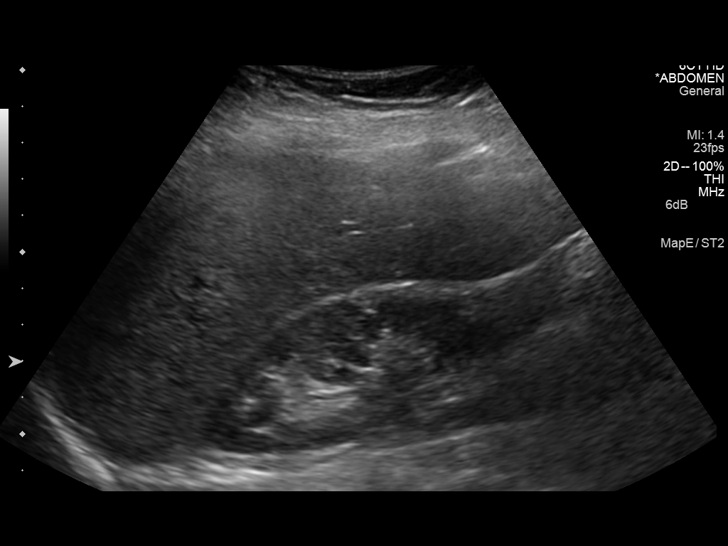
[im 16/41]
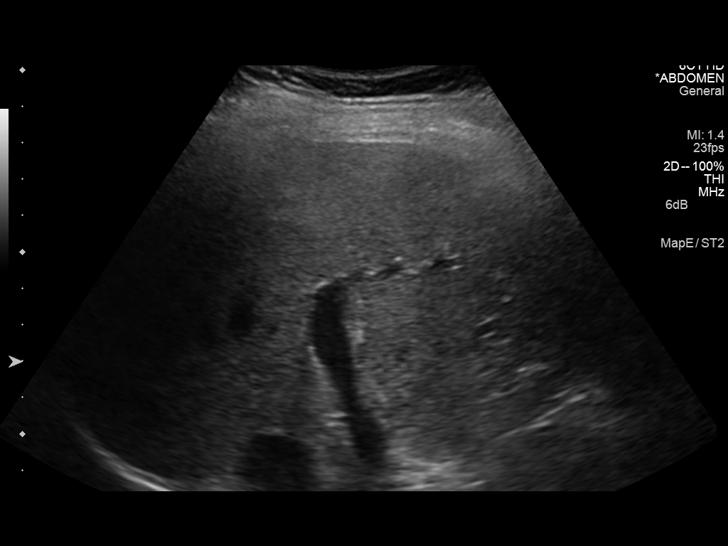
[im 19/41]
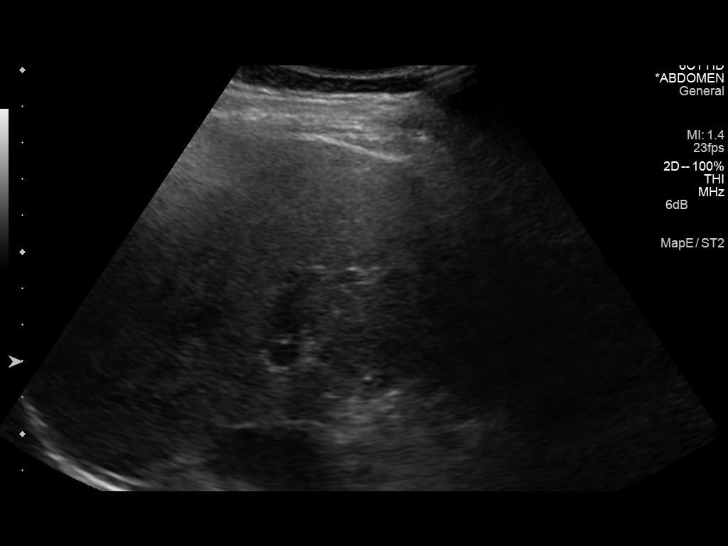
[im 22/41]
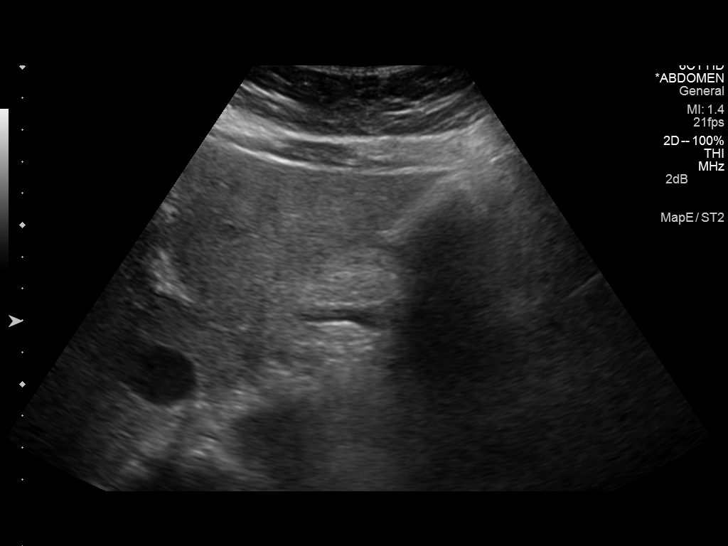
[im 26/41]
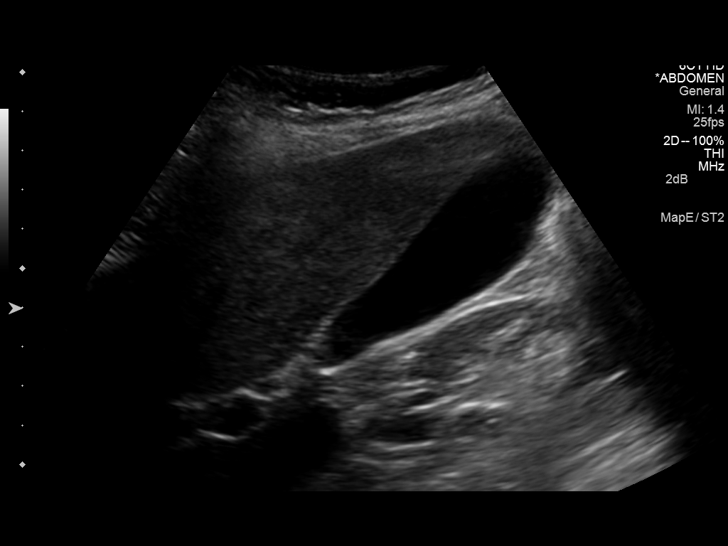
[im 27/41]
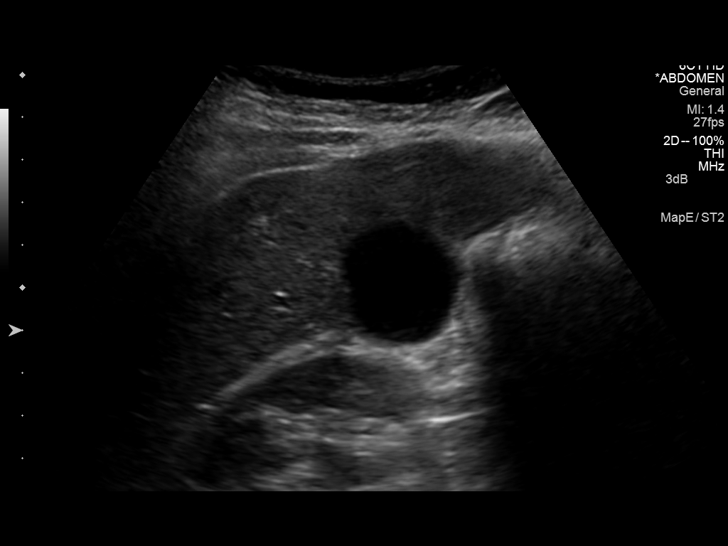
[im 31/41]
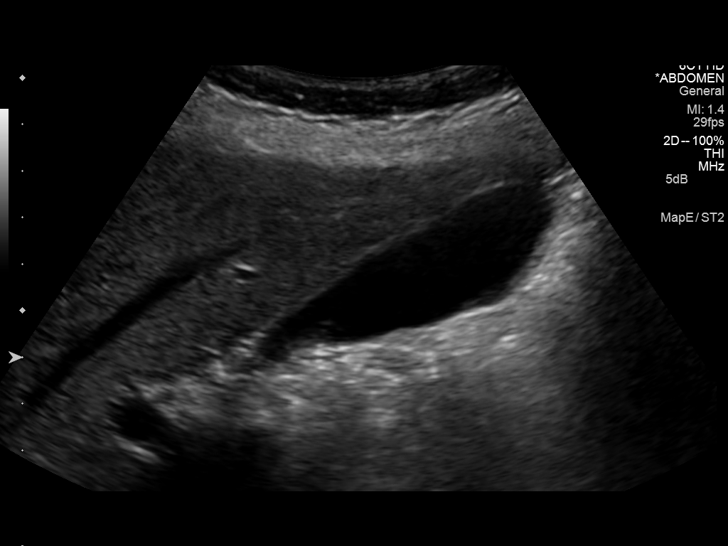
[im 34/41]
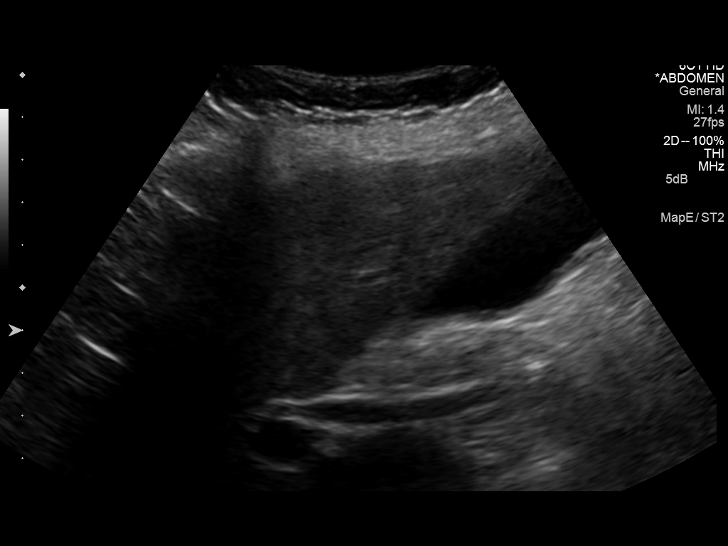
[im 37/41]
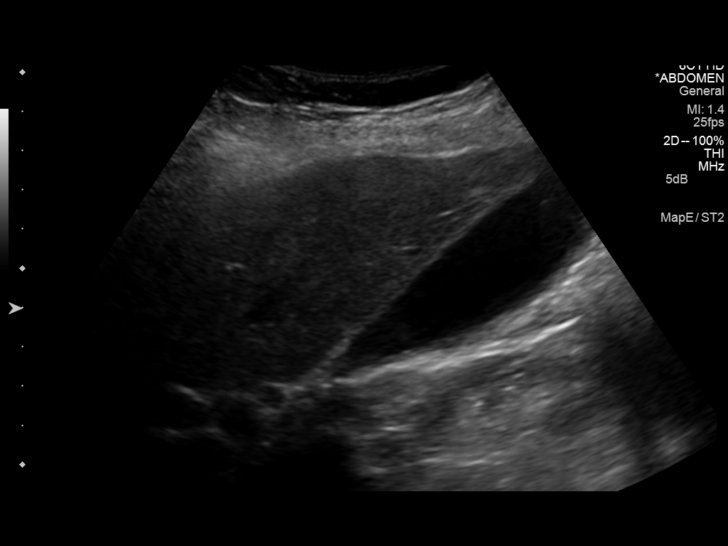
[im 41/41]
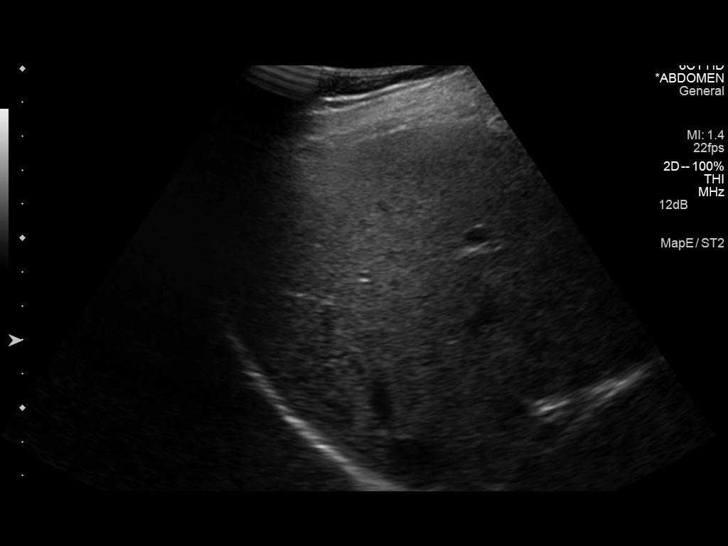

[14 of 25 positions shown; findings below may reference images not displayed]

FINDINGS: Gallbladder:

No gallstones or wall thickening visualized, measuring 1.6 mm. No
sonographic Murphy sign noted.

Common bile duct:

Diameter: 4.5 mm

Liver:

No focal lesion identified. Within normal limits in parenchymal
echogenicity.
IMPRESSION: Negative right upper quadrant abdominal ultrasound.

## 2015-11-24 ENCOUNTER — Encounter: Payer: Self-pay | Admitting: Adult Health

## 2015-11-24 ENCOUNTER — Ambulatory Visit (INDEPENDENT_AMBULATORY_CARE_PROVIDER_SITE_OTHER): Payer: 59 | Admitting: Adult Health

## 2015-11-24 VITALS — BP 100/88 | Temp 98.5°F | Wt 139.6 lb

## 2015-11-24 DIAGNOSIS — F329 Major depressive disorder, single episode, unspecified: Secondary | ICD-10-CM | POA: Diagnosis not present

## 2015-11-24 DIAGNOSIS — F32A Depression, unspecified: Secondary | ICD-10-CM

## 2015-11-24 MED ORDER — ESCITALOPRAM OXALATE 20 MG PO TABS: 20.0000 mg | ORAL_TABLET | Freq: Every day | ORAL | Status: DC

## 2015-11-24 MED ORDER — MIRTAZAPINE 15 MG PO TABS: 15.0000 mg | ORAL_TABLET | Freq: Every day | ORAL | Status: DC

## 2015-11-24 NOTE — Patient Instructions (Signed)
It was great meeting you today and I am sorry you're having to go through this.  I have sent in two prescriptions the first being Lexapro 20 mg, he will take this daily. Second prescription will be for Remeron 15 mg, take this before bed do not take it with her other sleeping medication as this will also make you sleepy.  Follow-up with one of the psychiatrists that I provided to  I want to follow-up with me in one week  Tomorrow I like you to get out of bed and go do something outside with the children.

## 2015-11-24 NOTE — Progress Notes (Signed)
Subjective:    Patient ID: Cynthia [name], female    DOB: 03/29/1971, 45 y.o.   MRN: [id number]  HPI  45 year old female who presents to the office today for what appears to be depression. She reports over the last week that she has not left bed except to use the bathroom and get something to drink. All she wants to do is sleep because "that's where my happy thoughts are." She reports that she's had multiple compounding issues with family, work, and personal life and it made her feel very sad. 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 At home her husband constantly fight and he feels as though she cannot provide for her children and ways that she would like to. He states "[place] and I can't keep up with the local neighbors, my kids want $500 toys and I can't afford them. We've recently filed for bankruptcy and that's probably within the best thing that happened to me in a long time." She continues "I just want to get my life back".  She does report feeling "sick", she denies vomiting or diarrhea. She does not believe she's had a fever at home. As endorse episodes of nausea and generalized body aches.   She has been on Cymbalta 120 mg for "numerous years" this was use only for anxiety. She denies any past episodes of depression  Review of Systems  Constitutional: Positive for activity change, appetite change and fatigue. Negative for fever, chills and diaphoresis.  HENT: Negative.   Respiratory: Negative.   Cardiovascular: Negative.   Musculoskeletal: Positive for myalgias.  Neurological: Positive for weakness and light-headedness. Negative for headaches.  Psychiatric/Behavioral: Positive for sleep disturbance, decreased concentration and agitation. Negative for suicidal ideas and self-injury. The patient is nervous/anxious.    Past Medical History  Diagnosis Date  . Narcolepsy   . Anxiety   . Complication of anesthesia   .  PONV (postoperative nausea and vomiting)     nausea  . Migraines   . GERD (gastroesophageal reflux disease)     Gaviscon as needed  . Dental crowns present   . Carpal tunnel syndrome of right wrist   . AC separation 09/2014    left shoulder    Social History   Social History  . Marital Status: Married    Spouse Name: Jorja Loaim  . Number of Children: 2  . Years of Education: Bachelors   Occupational History  . 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 Smoking status: Former Smoker -- 0.00 packs/day for 0 years    Quit date: 08/13/2005  . Smokeless tobacco: Never Used  . Alcohol Use: No  . Drug Use: No  . Sexual Activity: Not on file   Other Topics Concern  . Not on file   Social History Narrative   Pt lives at home with husband [name]([name] aka Charles) and two children   Pt is right handed    Education - Bachelors degree   Caffeine 1-2 cups a day    Past Surgical History  Procedure Laterality Date  . Bunionectomy Bilateral   . Cesarean section  10/14/2005; 08/23/2007  . Eus N/A 01/22/2014    Procedure: UPPER ENDOSCOPIC ULTRASOUND (EUS) LINEAR;  Surgeon: Rachael Feeaniel P Jacobs, MD;  Location: [facility];  Service: Endoscopy;  Laterality: N/A;  . Laparoscopic lysis of adhesions  04/30/2009  . Laparoscopic supracervical hysterectomy  07/26/2009  .  Laparoscopic unilateral salpingo oopherectomy Right 07/26/2009  . Cystoscopy  07/26/2009  . Tubal ligation  08/23/2007  . Laparoscopic ovarian cystectomy Left 11/14/2002  . Endometrial ablation  11/14/2002  . Acromio-clavicular joint repair Left 10/12/2014    Procedure: Left shoulder acromio-clavicular joint repair;  Surgeon: Mable Paris, MD;  [facility]:  [facility];  Service: Orthopedics;  Laterality: Left;  Left shoulder acromio-clavicular joint repair    Family History  Problem Relation Age of Onset  . Prostate cancer Maternal Grandfather   . Kidney disease Sister   . Heart disease Paternal Grandfather      Allergies  Allergen Reactions  . Meclizine Hcl Other (See Comments)    SEVERE HEARTBURN  . Morphine Itching  . Percocet [Oxycodone-Acetaminophen] Itching    Current Outpatient Prescriptions on File Prior to Visit  Medication Sig Dispense Refill  . amphetamine-dextroamphetamine (ADDERALL) 30 MG tablet Take 1 tablet by mouth 2 (two) times daily with a meal. 60 tablet 0  . clonazePAM (KLONOPIN) 0.5 MG tablet Take 1 tablet (0.5 mg total) by mouth 3 (three) times daily as needed. for anxiety 90 tablet 4  . DULoxetine (CYMBALTA) 60 MG capsule TAKE ONE CAPSULE BY MOUTH TWICE A DAY 60 capsule 1  . Sodium Oxybate (XYREM) 500 MG/ML SOLN Take 3.75gms for 1st nightly dose and 3 gms for 2nd nightly dose.  (Total dose 6.75 grams nightly) 270 mL 5   No current facility-administered medications on file prior to visit.    BP 100/88 mmHg  Temp(Src) 98.5 F (36.9 C) (Oral)  Wt 139 lb 9.6 oz (63.322 kg)       Objective:   Physical Exam  Constitutional: She is oriented to person, place, and time. She appears well-developed and well-nourished. No distress.  HENT:  Head: Normocephalic and atraumatic.  Right Ear: External ear normal.  Left Ear: External ear normal.  Nose: Nose normal.  Mouth/Throat: Oropharynx is clear and moist. No oropharyngeal exudate.  Cardiovascular: Normal rate, regular rhythm, normal heart sounds and intact distal pulses.  Exam reveals no gallop and no friction rub.   No murmur heard. Pulmonary/Chest: Effort normal and breath sounds normal. No respiratory distress. She has no wheezes. She has no rales. She exhibits no tenderness.  Neurological: She is alert and oriented to person, place, and time.  Skin: Skin is warm and dry. No rash noted. She is not diaphoretic. No erythema. No pallor.  Scars on chest from picking  Psychiatric: Her speech is normal. Judgment and thought content normal. She is slowed and withdrawn. She exhibits a depressed mood.  Appears severely  depressed and is tearful many times throughout the exam.   Nursing note and vitals reviewed.      Assessment & Plan:  1. Depression - escitalopram (LEXAPRO) 20 MG tablet; Take 1 tablet (20 mg total) by mouth daily.  Dispense: 30 tablet; Refill: 3 - mirtazapine (REMERON) 15 MG tablet; Take 1 tablet (15 mg total) by mouth at bedtime.  Dispense: 30 tablet; Refill: 1 - CBC with Differential/Platelet - TSH - Basic metabolic panel - Follow up in one week - If any thoughts of SI or self harm, report to the emergency room - Get outside tomorrow with your children  Shirline Frees, NP

## 2015-11-25 LAB — BASIC METABOLIC PANEL
BUN: 19 mg/dL (ref 6–23)
CALCIUM: 10.6 mg/dL — AB (ref 8.4–10.5)
CHLORIDE: 100 meq/L (ref 96–112)
CO2: 30 mEq/L (ref 19–32)
CREATININE: 0.83 mg/dL (ref 0.40–1.20)
GFR: 79.99 mL/min (ref >60.00)
GLUCOSE: 104 mg/dL — AB (ref 70–99)
POTASSIUM: 3.5 meq/L (ref 3.5–5.1)
SODIUM: 137 meq/L (ref 135–145)

## 2015-11-25 LAB — CBC WITH DIFFERENTIAL/PLATELET
BASOS ABS: 0.1 10*3/uL (ref 0.0–0.1)
Basophils Relative: 1 % (ref 0.0–3.0)
Eosinophils Absolute: 0.1 10*3/uL (ref 0.0–0.7)
Eosinophils Relative: 0.8 % (ref 0.0–5.0)
HEMATOCRIT: 42.6 % (ref 36.0–46.0)
Hemoglobin: 14.6 g/dL (ref 12.0–15.0)
LYMPHS PCT: 35.7 % (ref 12.0–46.0)
Lymphs Abs: 2.5 10*3/uL (ref 0.7–4.0)
MCHC: 34.3 g/dL (ref 30.0–36.0)
MCV: 89.5 fl (ref 78.0–100.0)
MONOS PCT: 6.8 % (ref 3.0–12.0)
Monocytes Absolute: 0.5 10*3/uL (ref 0.1–1.0)
Neutro Abs: 3.8 10*3/uL (ref 1.4–7.7)
Neutrophils Relative %: 55.7 % (ref 43.0–77.0)
Platelets: 305 10*3/uL (ref 150.0–400.0)
RBC: 4.76 Mil/uL (ref 3.87–5.11)
RDW: 12.2 % (ref 11.5–15.5)
WBC: 6.9 10*3/uL (ref 4.0–10.5)

## 2015-11-25 LAB — TSH: TSH: 2.27 u[IU]/mL (ref 0.35–4.50)

## 2015-11-29 ENCOUNTER — Ambulatory Visit: Payer: 59 | Admitting: Neurology

## 2015-11-29 ENCOUNTER — Telehealth: Payer: Self-pay

## 2015-11-29 DIAGNOSIS — G47411 Narcolepsy with cataplexy: Secondary | ICD-10-CM

## 2015-11-29 MED ORDER — AMPHETAMINE-DEXTROAMPHETAMINE 30 MG PO TABS: 30.0000 mg | ORAL_TABLET | Freq: Two times a day (BID) | ORAL | Status: DC

## 2015-11-29 NOTE — Addendum Note (Signed)
Addended by: Crisoforo OxfordURNER, DIANA S on: 11/29/2015 05:13 PM   Modules accepted: Orders

## 2015-11-29 NOTE — Telephone Encounter (Signed)
Patient missed appt today. Mixed up appt times. Needs refill of adderall.

## 2015-11-29 NOTE — Telephone Encounter (Signed)
Reprinted, wrong doctor signed Rx.

## 2015-11-29 NOTE — Telephone Encounter (Signed)
She can pick up Rx 

## 2015-11-29 NOTE — Telephone Encounter (Signed)
Printed, signed, taken to front desk

## 2015-12-01 ENCOUNTER — Ambulatory Visit: Payer: Self-pay | Admitting: Adult Health

## 2015-12-02 ENCOUNTER — Encounter: Payer: Self-pay | Admitting: Neurology

## 2015-12-08 ENCOUNTER — Encounter: Payer: Self-pay | Admitting: Neurology

## 2015-12-08 ENCOUNTER — Telehealth: Payer: Self-pay

## 2015-12-08 ENCOUNTER — Ambulatory Visit (INDEPENDENT_AMBULATORY_CARE_PROVIDER_SITE_OTHER): Payer: 59 | Admitting: Neurology

## 2015-12-08 VITALS — BP 104/70 | HR 80 | Resp 16 | Ht 61.0 in | Wt 146.0 lb

## 2015-12-08 DIAGNOSIS — G47411 Narcolepsy with cataplexy: Secondary | ICD-10-CM

## 2015-12-08 MED ORDER — AMPHETAMINE-DEXTROAMPHETAMINE 10 MG PO TABS: ORAL_TABLET | ORAL | Status: DC

## 2015-12-08 NOTE — Patient Instructions (Addendum)
We may be able to increase your Xyrem to 3.75 g twice daily. Let me know.  [facility].  We will keep your Adderall 30 mg twice daily, at 7 AM and 3 PM.  We will add a smaller dose of Adderall 10 mg daily at 11 AM.  Drink more Water!
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
## 2015-12-08 NOTE — Telephone Encounter (Signed)
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

## 2015-12-14 ENCOUNTER — Ambulatory Visit (INDEPENDENT_AMBULATORY_CARE_PROVIDER_SITE_OTHER): Payer: 59 | Admitting: Family Medicine

## 2015-12-14 VITALS — BP 100/64 | HR 64 | Temp 98.4°F | Wt 144.0 lb

## 2015-12-14 DIAGNOSIS — J029 Acute pharyngitis, unspecified: Secondary | ICD-10-CM

## 2015-12-14 LAB — POCT RAPID STREP A (OFFICE): Rapid Strep A Screen: NEGATIVE

## 2015-12-14 NOTE — Progress Notes (Signed)
Pre visit review using our clinic review tool, if applicable. No additional management support is needed unless otherwise documented below in the visit note. 

## 2015-12-14 NOTE — Patient Instructions (Signed)
Before you leave: -Strep culture -Work note, seen today and may return to work  INSTRUCTIONS FOR UPPER RESPIRATORY INFECTION:  -plenty of rest and fluids  -nasal saline wash 2-3 times daily (use prepackaged nasal saline or bottled/distilled water if making your own)   -can use AFRIN nasal spray for drainage and nasal congestion - but do NOT use longer then 3-4 days  -can use tylenol (in no history of liver disease) or ibuprofen (if no history of kidney disease, bowel bleeding or significant heart disease) as directed for aches and sorethroat  -in the winter time, using a humidifier at night is helpful (please follow cleaning instructions)  -if you are taking a cough medication - use only as directed, may also try a teaspoon of honey to coat the throat and throat lozenges.  -for sore throat, salt water gargles can help  -follow up if you have fevers, facial pain, tooth pain, difficulty breathing or are worsening or symptoms persist longer then expected  Upper Respiratory Infection, Adult An upper respiratory infection (URI) is also known as the common cold. It is often caused by a type of germ (virus). Colds are easily spread (contagious). You can pass it to others by kissing, coughing, sneezing, or drinking out of the same glass. Usually, you get better in 1 to 3  weeks.  However, the cough can last for even longer. HOME CARE   Only take medicine as told by your doctor. Follow instructions provided above.  Drink enough water and fluids to keep your pee (urine) clear or pale yellow.  Get plenty of rest.  Return to work when your temperature is < 100 for 24 hours or as told by your doctor. You may use a face mask and wash your hands to stop your cold from spreading. GET HELP RIGHT AWAY IF:   After the first few days, you feel you are getting worse.  You have questions about your medicine.  You have chills, shortness of breath, or red spit (mucus).  You have pain in the face for  more then 1-2 days, especially when you bend forward.  You have a fever, puffy (swollen) neck, pain when you swallow, or white spots in the back of your throat.  You have a bad headache, ear pain, sinus pain, or chest pain.  You have a high-pitched whistling sound when you breathe in and out (wheezing).  You cough up blood.  You have sore muscles or a stiff neck. MAKE SURE YOU:   Understand these instructions.  Will watch your condition.  Will get help right away if you are not doing well or get worse. Document Released: 01/17/2008 Document Revised: 10/23/2011 Document Reviewed: 11/05/2013 [facility] Patient Information 2015 [employer], [place]. This information is not intended to replace advice given to you by your health care provider. Make sure you discuss any questions you have with your health care provider.

## 2015-12-14 NOTE — Addendum Note (Signed)
Addended by: Kern ReapVEREEN, RACHEL B on: 12/14/2015 04:51 PM   Modules accepted: Orders

## 2015-12-14 NOTE — Progress Notes (Signed)
HPI:  Acute visit for URI: -started: 2 days ago -symptoms:nasal congestion, sore throat, cough, ears a little full -denies:fever, SOB, NVD, tooth pain -has tried: nothing -sick contacts/travel/risks: no reported flu, strep or tick exposure -Hx of: concerned for strep - daughter with similar symptoms but resolved  ROS: See pertinent positives and negatives per HPI.  Past Medical History  Diagnosis Date  . Narcolepsy   . Anxiety   . Complication of anesthesia   . PONV (postoperative nausea and vomiting)     nausea  . Migraines   . GERD (gastroesophageal reflux disease)     Gaviscon as needed  . Dental crowns present   . Carpal tunnel syndrome of right wrist   . AC separation 09/2014    left shoulder    Past Surgical History  Procedure Laterality Date  . Bunionectomy Bilateral   . Cesarean section  10/14/2005; 08/23/2007  . Eus N/A 01/22/2014    Procedure: UPPER ENDOSCOPIC ULTRASOUND (EUS) LINEAR;  Surgeon: Rachael Feeaniel P Jacobs, MD;  Location: [facility];  Service: Endoscopy;  Laterality: N/A;  . Laparoscopic lysis of adhesions  04/30/2009  . Laparoscopic supracervical hysterectomy  07/26/2009  . Laparoscopic unilateral salpingo oopherectomy Right 07/26/2009  . Cystoscopy  07/26/2009  . Tubal ligation  08/23/2007  . Laparoscopic ovarian cystectomy Left 11/14/2002  . Endometrial ablation  11/14/2002  . Acromio-clavicular joint repair Left 10/12/2014    Procedure: Left shoulder acromio-clavicular joint repair;  Surgeon: Mable ParisJustin William Chandler, MD;  Location: [facility];  Service: Orthopedics;  Laterality: Left;  Left shoulder acromio-clavicular joint repair    Family History  Problem Relation Age of Onset  . Prostate cancer Maternal Grandfather   . Kidney disease Sister   . Heart disease Paternal Grandfather     Social History   Social History  . Marital Status: Married    Spouse Name: Jorja Loaim  . Number of Children: 2  . Years of Education: Bachelors    Occupational History  . 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 Smoking status: Former Smoker -- 0.00 packs/day for 0 years    Quit date: 08/13/2005  . Smokeless tobacco: Never Used  . Alcohol Use: No  . Drug Use: No  . Sexual Activity: Not on file   Other Topics Concern  . Not on file   Social History Narrative   Pt lives at home with husband [name]([name] aka Charles) and two children   Pt is right handed    Education - Bachelors degree   Caffeine 1-2 cups a day     Current outpatient prescriptions:  .  amphetamine-dextroamphetamine (ADDERALL) 10 MG tablet, Daily at 11 AM. Along with 30 mg bid, separate Rx, Disp: 30 tablet, Rfl: 0 .  amphetamine-dextroamphetamine (ADDERALL) 30 MG tablet, Take 1 tablet by mouth 2 (two) times daily with a meal., Disp: 60 tablet, Rfl: 0 .  clonazePAM (KLONOPIN) 0.5 MG tablet, Take 1 tablet (0.5 mg total) by mouth 3 (three) times daily as needed. for anxiety, Disp: 90 tablet, Rfl: 4 .  DULoxetine (CYMBALTA) 60 MG capsule, 2 tablets daily, Disp: , Rfl:  .  Sodium Oxybate (XYREM) 500 MG/ML SOLN, Take 3.75gms for 1st nightly dose and 3 gms for 2nd nightly dose.  (Total dose 6.75 grams nightly), Disp: 270 mL, Rfl: 5  EXAM:  Filed Vitals:   12/14/15 1629  BP: 100/64  Pulse: 64  Temp: 98.4 F (36.9 C)    Body mass index is 27.22 kg/(m^2).  GENERAL: vitals reviewed and listed above, alert, oriented, appears well hydrated and in no acute distress  HEENT: atraumatic, conjunttiva clear, no obvious abnormalities on inspection of external nose and ears, normal appearance of ear canals and TMs, clear nasal congestion, mild post oropharyngeal erythema with PND, 1+ tonsillar edema or exudate, no sinus TTP  NECK: no obvious masses on inspection  LUNGS: clear to auscultation bilaterally, no wheezes, rales or rhonchi, good air movement  CV: HRRR, no peripheral edema  MS: moves all extremities without noticeable abnormality  PSYCH: pleasant and  cooperative, no obvious depression or anxiety  ASSESSMENT AND PLAN:  Discussed the following assessment and plan:  Sore throat - Plan: POC Rapid Strep A  -given HPI and exam findings today, a serious infection or illness is unlikely. We discussed potential etiologies, with VURI being most likely, and advised supportive care and monitoring. Rapid strep neg, culture pending per her concerns.We discussed treatment side effects, likely course, antibiotic misuse, transmission, and signs of developing a serious illness. -of course, we advised to return or notify a doctor immediately if symptoms worsen or persist or new concerns arise.    Patient Instructions  Before you leave: -Strep culture -Work note, seen today and may return to work  INSTRUCTIONS FOR UPPER RESPIRATORY INFECTION:  -plenty of rest and fluids  -nasal saline wash 2-3 times daily (use prepackaged nasal saline or bottled/distilled water if making your own)   -can use AFRIN nasal spray for drainage and nasal congestion - but do NOT use longer then 3-4 days  -can use tylenol (in no history of liver disease) or ibuprofen (if no history of kidney disease, bowel bleeding or significant heart disease) as directed for aches and sorethroat  -in the winter time, using a humidifier at night is helpful (please follow cleaning instructions)  -if you are taking a cough medication - use only as directed, may also try a teaspoon of honey to coat the throat and throat lozenges.  -for sore throat, salt water gargles can help  -follow up if you have fevers, facial pain, tooth pain, difficulty breathing or are worsening or symptoms persist longer then expected  Upper Respiratory Infection, Adult An upper respiratory infection (URI) is also known as the common cold. It is often caused by a type of germ (virus). Colds are easily spread (contagious). You can pass it to others by kissing, coughing, sneezing, or drinking out of the same glass.  Usually, you get better in 1 to 3  weeks.  However, the cough can last for even longer. HOME CARE   Only take medicine as told by your doctor. Follow instructions provided above.  Drink enough water and fluids to keep your pee (urine) clear or pale yellow.  Get plenty of rest.  Return to work when your temperature is < 100 for 24 hours or as told by your doctor. You may use a face mask and wash your hands to stop your cold from spreading. GET HELP RIGHT AWAY IF:   After the first few days, you feel you are getting worse.  You have questions about your medicine.  You have chills, shortness of breath, or red spit (mucus).  You have pain in the face for more then 1-2 days, especially when you bend forward.  You have a fever, puffy (swollen) neck, pain when you swallow, or white spots in the back of your throat.  You have a bad headache, ear pain, sinus pain, or chest pain.  You have a  high-pitched whistling sound when you breathe in and out (wheezing).  You cough up blood.  You have sore muscles or a stiff neck. MAKE SURE YOU:   Understand these instructions.  Will watch your condition.  Will get help right away if you are not doing well or get worse. Document Released: 01/17/2008 Document Revised: 10/23/2011 Document Reviewed: 11/05/2013 [facility] Patient Information 2015 [place], [place]. This information is not intended to replace advice given to you by your health care provider. Make sure you discuss any questions you have with your health care provider.       Kriste Basque R.

## 2015-12-16 ENCOUNTER — Telehealth: Payer: Self-pay | Admitting: Family Medicine

## 2015-12-16 LAB — CULTURE, GROUP A STREP

## 2015-12-16 MED ORDER — AMOXICILLIN 500 MG PO CAPS: 500.0000 mg | ORAL_CAPSULE | Freq: Two times a day (BID) | ORAL | Status: DC

## 2015-12-16 NOTE — Addendum Note (Signed)
Addended by: Johnella MoloneyFUNDERBURK, JO A on: 12/16/2015 05:56 PM   Modules accepted: Orders

## 2015-12-16 NOTE — Telephone Encounter (Signed)
Pt returned your call refused to tell me why.

## 2015-12-16 NOTE — Telephone Encounter (Signed)
I called the pt and informed her of the results of the strep culture.

## 2015-12-29 ENCOUNTER — Encounter: Payer: Self-pay | Admitting: Neurology

## 2015-12-30 ENCOUNTER — Telehealth: Payer: Self-pay

## 2015-12-30 DIAGNOSIS — G47411 Narcolepsy with cataplexy: Secondary | ICD-10-CM

## 2015-12-30 MED ORDER — AMPHETAMINE-DEXTROAMPHETAMINE 30 MG PO TABS: 30.0000 mg | ORAL_TABLET | Freq: Two times a day (BID) | ORAL | Status: DC

## 2015-12-30 MED ORDER — AMPHETAMINE-DEXTROAMPHETAMINE 10 MG PO TABS: ORAL_TABLET | ORAL | Status: DC

## 2015-12-30 NOTE — Telephone Encounter (Signed)
Rx printed, once signed, I will let patient know Rxs are ready to pick up at front desk via MyChart. I will also advise her that Rx must last 30 days.

## 2016-01-24 ENCOUNTER — Other Ambulatory Visit: Payer: Self-pay

## 2016-01-24 NOTE — Telephone Encounter (Signed)
Is she no longer taking the Lexapro and remeron I prescribed?

## 2016-01-24 NOTE — Telephone Encounter (Signed)
Pt is requesting a refill. She was here on 11/24/15. Pt has an appt on 7/27 with [place] Betty.  Okay to refill?

## 2016-01-25 NOTE — Telephone Encounter (Signed)
Pt states that she did not like the Lexapro and remeron. Pt states that she decided to go back to Cymbalta. Pt says that she was only taking one tablet before so now she is taking 2 tablets as the directions state and she is doing well on it.

## 2016-01-27 MED ORDER — DULOXETINE HCL 60 MG PO CPEP: 60.0000 mg | ORAL_CAPSULE | Freq: Two times a day (BID) | ORAL | Status: DC

## 2016-01-27 NOTE — Telephone Encounter (Signed)
Cory out of the office - Ok to refill? 

## 2016-01-27 NOTE — Telephone Encounter (Signed)
Pt is taking cymbalta 60 mg in morning and 60 mg in afternoon. Pt needs refill asap. [facility]

## 2016-02-03 ENCOUNTER — Encounter: Payer: Self-pay | Admitting: Neurology

## 2016-02-03 DIAGNOSIS — G47411 Narcolepsy with cataplexy: Secondary | ICD-10-CM

## 2016-02-03 MED ORDER — AMPHETAMINE-DEXTROAMPHETAMINE 30 MG PO TABS: 30.0000 mg | ORAL_TABLET | Freq: Two times a day (BID) | ORAL | Status: DC

## 2016-02-03 MED ORDER — AMPHETAMINE-DEXTROAMPHETAMINE 10 MG PO TABS: ORAL_TABLET | ORAL | Status: DC

## 2016-02-03 NOTE — Telephone Encounter (Signed)
Refill request

## 2016-02-23 ENCOUNTER — Other Ambulatory Visit: Payer: Self-pay | Admitting: Family Medicine

## 2016-02-23 NOTE — Telephone Encounter (Signed)
Rx refill sent to pharmacy. 

## 2016-02-29 ENCOUNTER — Encounter: Payer: Self-pay | Admitting: Neurology

## 2016-03-01 ENCOUNTER — Telehealth: Payer: Self-pay

## 2016-03-01 NOTE — Telephone Encounter (Signed)
I spoke to patient about letter requested through MyChart. I clarified any restrictions that the county has. I advised her that I will call her back as soon as letter is ready.

## 2016-03-02 NOTE — Telephone Encounter (Signed)
I called patient and advised her that her letter is ready for pick up at the front desk

## 2016-03-06 ENCOUNTER — Encounter: Payer: Self-pay | Admitting: Neurology

## 2016-03-06 DIAGNOSIS — G47411 Narcolepsy with cataplexy: Secondary | ICD-10-CM

## 2016-03-06 MED ORDER — AMPHETAMINE-DEXTROAMPHETAMINE 30 MG PO TABS: 30.0000 mg | ORAL_TABLET | Freq: Two times a day (BID) | ORAL | 0 refills | Status: DC

## 2016-03-06 MED ORDER — AMPHETAMINE-DEXTROAMPHETAMINE 10 MG PO TABS: ORAL_TABLET | ORAL | 0 refills | Status: DC

## 2016-03-06 NOTE — Addendum Note (Signed)
Addended by: Crisoforo Oxford on: 03/06/2016 08:07 AM   Modules accepted: Orders

## 2016-03-09 ENCOUNTER — Ambulatory Visit (INDEPENDENT_AMBULATORY_CARE_PROVIDER_SITE_OTHER): Payer: 59 | Admitting: Family Medicine

## 2016-03-09 ENCOUNTER — Encounter: Payer: Self-pay | Admitting: Family Medicine

## 2016-03-09 VITALS — BP 102/64 | HR 83 | Temp 98.3°F | Ht 61.0 in | Wt 143.5 lb

## 2016-03-09 DIAGNOSIS — L280 Lichen simplex chronicus: Secondary | ICD-10-CM

## 2016-03-09 DIAGNOSIS — F411 Generalized anxiety disorder: Secondary | ICD-10-CM | POA: Diagnosis not present

## 2016-03-09 MED ORDER — DULOXETINE HCL 60 MG PO CPEP: 60.0000 mg | ORAL_CAPSULE | Freq: Once | ORAL | 1 refills | Status: DC

## 2016-03-09 MED ORDER — BUPROPION HCL ER (SR) 100 MG PO TB12: 100.0000 mg | ORAL_TABLET | Freq: Two times a day (BID) | ORAL | 1 refills | Status: DC

## 2016-03-09 MED ORDER — HYDROXYZINE HCL 25 MG PO TABS: 25.0000 mg | ORAL_TABLET | Freq: Three times a day (TID) | ORAL | 0 refills | Status: DC | PRN

## 2016-03-09 NOTE — Progress Notes (Signed)
HPI:   Cynthia [name] is a 45 y.o. female, who is here today to establish care with me.  Former PCP: Cynthia [name]. Last preventive routine visit: last gyn evaluation last year in 04/2018. According to pt, her gynecologists checks lipids and other lab work periodically.     Concerns today: "picking on skin" for 3-4 years, attributed to stress. Refills on Cymbalta.  Skin picking , upper chest mainly, also scalp. She has caused superficial skin excoriations due to constant scratching areas of upper chest. It has improved some with anxiety improvement. Occasionally pruritus that triggers behavior.She denies pulling/eating her hair.  Anxiety: She is currently on Cymbalta 120 mg daily, which she has been taking for about 3-4 years. She has tolerated well, sometimes she is irritable but in general she feels like anxiety is better controlled, not resolved. She also has Clonazepam Rx but has not taken it in a while.  She denies depressed mood or suicidal ideation. Celexa: she "hated it." Also tried Prozac, did not help. Remeron recommended, did not take it.    She does not exercise  due to work schedule. Eating healthier.  Live with husband and 2 children.  Hx of narcolepsy, follows with neurologists, Dr Frances Furbish. She is currently on Adderall and has taken it for 3-4 years.     Review of Systems  Constitutional: Negative for appetite change, diaphoresis, fatigue, fever and unexpected weight change.  HENT: Negative for nosebleeds and sore throat.   Eyes: Negative for pain and visual disturbance.  Respiratory: Negative for cough, shortness of breath and wheezing.   Cardiovascular: Negative for chest pain, palpitations and leg swelling.  Gastrointestinal: Negative for abdominal pain, nausea and vomiting.       No changes in bowel habits.  Genitourinary: Negative for decreased urine volume, difficulty urinating and hematuria.  Musculoskeletal: Negative for gait problem and  myalgias.  Skin: Positive for wound. Negative for rash.  Neurological: Negative for tremors, seizures, syncope and headaches.       +tics (scratching upper chest).  Psychiatric/Behavioral: Positive for sleep disturbance. Negative for confusion, hallucinations and suicidal ideas. The patient is nervous/anxious.       Current Outpatient Prescriptions on File Prior to Visit  Medication Sig Dispense Refill  . amphetamine-dextroamphetamine (ADDERALL) 10 MG tablet Daily at 11 AM. Along with 30 mg bid, separate Rx 30 tablet 0  . amphetamine-dextroamphetamine (ADDERALL) 30 MG tablet Take 1 tablet by mouth 2 (two) times daily with a meal. 60 tablet 0  . clonazePAM (KLONOPIN) 0.5 MG tablet Take 1 tablet (0.5 mg total) by mouth 3 (three) times daily as needed. for anxiety 90 tablet 4  . Sodium Oxybate (XYREM) 500 MG/ML SOLN Take 3.75gms for 1st nightly dose and 3 gms for 2nd nightly dose.  (Total dose 6.75 grams nightly) 270 mL 5   No current facility-administered medications on file prior to visit.      Past Medical History:  Diagnosis Date  . AC separation 09/2014   left shoulder  . Anxiety   . Carpal tunnel syndrome of right wrist   . Complication of anesthesia   . Dental crowns present   . GERD (gastroesophageal reflux disease)    Gaviscon as needed  . Migraines   . Narcolepsy   . PONV (postoperative nausea and vomiting)    nausea   Allergies  Allergen Reactions  . Meclizine Hcl Other (See Comments)    SEVERE HEARTBURN  . Morphine Itching  . Percocet [Oxycodone-Acetaminophen]  Itching    Family History  Problem Relation Age of Onset  . Prostate cancer Maternal Grandfather   . Kidney disease Sister   . Heart disease Paternal Grandfather     Social History   Social History  . Marital status: Married    Spouse name: Cynthia [name]  . Number of children: 2  . Years of education: Bachelors   Occupational History  . 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  Smoking status: Former Smoker    Packs/day: 0.00    Years: 0.00    Quit date: 08/13/2005  . Smokeless tobacco: Never Used  . Alcohol use No  . Drug use: No  . Sexual activity: Not Asked   Other Topics Concern  . None   Social History Narrative   Pt lives at home with husband [name] and two children   Pt is right handed    Education - Bachelors degree   Caffeine 1-2 cups a day    Vitals:   03/09/16 1359  BP: 102/64  Pulse: 83  Temp: 98.3 F (36.8 C)    Body mass index is 27.11 kg/m.      Physical Exam  Constitutional: She is oriented to person, place, and time. She appears well-developed and well-nourished. No distress.  HENT:  Head: Atraumatic.  Mouth/Throat: Oropharynx is clear and moist and mucous membranes are normal.  Eyes: Conjunctivae and EOM are normal. Pupils are equal, round, and reactive to light.  Neck: No thyromegaly present.  Cardiovascular: Normal rate and regular rhythm.   No murmur heard. Respiratory: Effort normal and breath sounds normal. No respiratory distress.  GI: Soft. She exhibits no mass. There is no hepatomegaly. There is no tenderness.  Musculoskeletal: She exhibits no edema or tenderness.  Lymphadenopathy:    She has no cervical adenopathy.  Neurological: She is alert and oriented to person, place, and time. She has normal strength. Coordination and gait normal.  Skin: Skin is warm. No ecchymosis, no petechiae and no rash noted. No erythema.     Hypertrophic scars on upper chest, R>L, about 3-6 mm.  Psychiatric: Her speech is normal. Her mood appears anxious.  Well groomed, good eye contact.      ASSESSMENT AND PLAN:     Diagnoses and all orders for this visit:  Local neurodermatitis  She agrees with trying Hydroxyzine, some side effects discussed. Adderall side effects discussed, this med could be aggravating problem (skin picking).  -     hydrOXYzine (ATARAX/VISTARIL) 25 MG tablet; Take 1 tablet (25 mg  total) by mouth 3 (three) times daily as needed for itching.  Generalized anxiety disorder  Stable but still not well controlled. She agrees with trying to decrease Cymbalta dose from 120 mg to 60 mg, if any withdrawal symptom, she can try take 60 mg q 2 days and rest 120 mg. Hydroxyzine may also help , prn. Wellbutrin started, some side effects discussed.  F/U in 3-4 weeks, before if needed.  -     hydrOXYzine (ATARAX/VISTARIL) 25 MG tablet; Take 1 tablet (25 mg total) by mouth 3 (three) times daily as needed for itching. -     buPROPion (WELLBUTRIN SR) 100 MG 12 hr tablet; Take 1 tablet (100 mg total) by mouth 2 (two) times daily. -     DULoxetine (CYMBALTA) 60 MG capsule; Take 1 capsule (60 mg total) by mouth once.           Betty G. [place],  MD  [facility]. [place] office.

## 2016-03-09 NOTE — Progress Notes (Signed)
Pre visit review using our clinic review tool, if applicable. No additional management support is needed unless otherwise documented below in the visit note. 

## 2016-03-09 NOTE — Patient Instructions (Addendum)
A few things to remember from today's visit:   Local neurodermatitis  Generalized anxiety disorder - Plan: buPROPion (WELLBUTRIN SR) 100 MG 12 hr tablet, DULoxetine (CYMBALTA) 60 MG capsule  Decreased Cymbalta and start Wellbutrin  Please be sure medication list is accurate. If a new problem present, please set up appointment sooner than planned today.

## 2016-03-13 ENCOUNTER — Encounter: Payer: Self-pay | Admitting: Family Medicine

## 2016-03-14 ENCOUNTER — Ambulatory Visit: Payer: Self-pay | Admitting: Family Medicine

## 2016-03-21 ENCOUNTER — Encounter: Payer: Self-pay | Admitting: Neurology

## 2016-03-24 ENCOUNTER — Other Ambulatory Visit: Payer: Self-pay | Admitting: Family Medicine

## 2016-03-28 ENCOUNTER — Ambulatory Visit (INDEPENDENT_AMBULATORY_CARE_PROVIDER_SITE_OTHER): Payer: 59 | Admitting: Family Medicine

## 2016-03-28 ENCOUNTER — Encounter: Payer: Self-pay | Admitting: Family Medicine

## 2016-03-28 VITALS — BP 110/70 | HR 86 | Resp 12 | Ht 61.0 in | Wt 143.0 lb

## 2016-03-28 DIAGNOSIS — F411 Generalized anxiety disorder: Secondary | ICD-10-CM

## 2016-03-28 DIAGNOSIS — L280 Lichen simplex chronicus: Secondary | ICD-10-CM | POA: Insufficient documentation

## 2016-03-28 MED ORDER — TRIAMCINOLONE ACETONIDE 0.025 % EX OINT: 1.0000 "application " | TOPICAL_OINTMENT | Freq: Two times a day (BID) | CUTANEOUS | 2 refills | Status: DC

## 2016-03-28 MED ORDER — BUPROPION HCL ER (XL) 150 MG PO TB24: 150.0000 mg | ORAL_TABLET | Freq: Every day | ORAL | 3 refills | Status: DC

## 2016-03-28 NOTE — Progress Notes (Signed)
Pre visit review using our clinic review tool, if applicable. No additional management support is needed unless otherwise documented below in the visit note. 

## 2016-03-28 NOTE — Progress Notes (Signed)
HPI:   Cynthia [name] is a 45 y.o. female, who is here today to follow on anxiety.   She was last seen 03/09/2016, when her Cymbalta was decreased from 120 mg to 60 mg and Wellbutrin SR 100 mg was added twice per day.  She denies any side effect. Blood in the morning she feels fine but during late afternoon she started feeling some anxiety. She denies depressed mood or suicidal thoughts. In general she feels more energetic during the day since she started the Wellbutrin.    -She is also complaining of intense pruritus of skin lesions around right clavicle and resulted from frequent scratching/picking on area. She has not noted pain, local heat, or drainage. She has been scratching area frequently for years, behavior seems to be aggravated by anxiety, she has considered hypnosis but has not done it because of cost. She didn't tolerate hydroxyzine, it caused severe drowsiness.    Hx of narcolepsy and sleep disorder, follows with neurologists, Dr Frances FurbishAthar, she is currently on Adderall and Xyrem.     Review of Systems  Constitutional: Negative for appetite change, diaphoresis, fatigue, fever and unexpected weight change.  HENT: Negative for nosebleeds, sore throat and trouble swallowing.   Respiratory: Negative for cough, shortness of breath and wheezing.   Gastrointestinal: Negative for abdominal pain, nausea and vomiting.       No changes in bowel habits.  Musculoskeletal: Negative for gait problem and myalgias.  Skin: Positive for rash.  Neurological: Negative for tremors, seizures, syncope, weakness and headaches.       +tics (scratching upper chest).  Psychiatric/Behavioral: Negative for confusion, hallucinations and suicidal ideas. The patient is nervous/anxious.       Current Outpatient Prescriptions on File Prior to Visit  Medication Sig Dispense Refill  . amphetamine-dextroamphetamine (ADDERALL) 10 MG tablet Daily at 11 AM. Along with 30 mg bid, separate Rx  30 tablet 0  . amphetamine-dextroamphetamine (ADDERALL) 30 MG tablet Take 1 tablet by mouth 2 (two) times daily with a meal. 60 tablet 0  . Sodium Oxybate (XYREM) 500 MG/ML SOLN Take 3.75gms for 1st nightly dose and 3 gms for 2nd nightly dose.  (Total dose 6.75 grams nightly) 270 mL 5  . DULoxetine (CYMBALTA) 60 MG capsule Take 1 capsule (60 mg total) by mouth once. 90 capsule 1   No current facility-administered medications on file prior to visit.      Past Medical History:  Diagnosis Date  . AC separation 09/2014   left shoulder  . Anxiety   . Carpal tunnel syndrome of right wrist   . Complication of anesthesia   . Dental crowns present   . GERD (gastroesophageal reflux disease)    Gaviscon as needed  . Migraines   . Narcolepsy   . PONV (postoperative nausea and vomiting)    nausea   Allergies  Allergen Reactions  . Meclizine Hcl Other (See Comments)    SEVERE HEARTBURN  . Morphine Itching  . Percocet [Oxycodone-Acetaminophen] Itching    Social History   Social History  . Marital status: Married    Spouse name: Cynthia [name]  . Number of children: 2  . Years of education: Bachelors   Occupational History  . 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 Smoking status: Former Smoker    Packs/day: 0.00    Years: 0.00    Quit date: 08/13/2005  . Smokeless tobacco: Never Used  . Alcohol use No  .  Drug use: No  . Sexual activity: Not Asked   Other Topics Concern  . None   Social History Narrative   Pt lives at home with husband [name]([name] aka Charles) and two children   Pt is right handed    Education - Bachelors degree   Caffeine 1-2 cups a day    Vitals:   03/28/16 1559  BP: 110/70  Pulse: 86  Resp: 12   Body mass index is 27.02 kg/m.    Physical Exam  Nursing note and vitals reviewed. Constitutional: She is oriented to person, place, and time. She appears well-developed. No distress.  HENT:  Head: Atraumatic.  Eyes: Conjunctivae and  EOM are normal. Pupils are equal, round, and reactive to light.  Cardiovascular: Normal rate and regular rhythm.   No murmur heard. Respiratory: Effort normal and breath sounds normal. No respiratory distress.  GI: Soft.  Musculoskeletal: She exhibits no edema.  Lymphadenopathy:    She has no cervical adenopathy.  Neurological: She is alert and oriented to person, place, and time. She has normal strength. Gait normal.  Skin: Skin is warm. Rash noted. There is erythema.     Erythematous scaly lesions under right clavicle, superficial excoriations.  Psychiatric: Her speech is normal. Her mood appears anxious.  Well groomed, good eye contact. She is constantly scratching right infraclavicular area during interview.      ASSESSMENT AND PLAN:     Cynthia [name] was seen today for follow-up.  Diagnoses and all orders for this visit:  Neurodermatitis, localized  This has been going on for years, Adderall could aggravate problem. She will try topical steroid, some side effects discussed. Follow-up as needed.  -     triamcinolone (KENALOG) 0.025 % ointment; Apply 1 application topically 2 (two) times daily.   Generalized anxiety disorder  Symptomatic in the afternoon, she would like to continue Wellbutrin.  We will continue Cymbalta 60 mg daily in the morning. Wellbutrin changed to XL 150 mg daily, recommended taking it with lunch or in the afternoon. Plan discussed some side effects. Instructed about warning signs, she was instructed to let me know thorough my chart if she is having any problem with medication. Follow-up in 5-6 months.  -     buPROPion (WELLBUTRIN XL) 150 MG 24 hr tablet; Take 1 tablet (150 mg total) by mouth daily.        -Ms. Cynthia [name] was advised to return sooner than planned today if new concerns arise.       Cynthia Delio G. [place], MD  [facility]. [place] office.

## 2016-03-28 NOTE — Patient Instructions (Addendum)
A few things to remember from today's visit:   Generalized anxiety disorder - Plan: buPROPion (WELLBUTRIN XL) 150 MG 24 hr tablet  Neurodermatitis, localized - Plan: triamcinolone (KENALOG) 0.025 % ointment Wellbutrin increased 150 XL, take in the afternoon. No changes in Cymbalta. Keep me informed through my chart about side effects or worsening symptoms.  Please be sure medication list is accurate. If a new problem present, please set up appointment sooner than planned today.

## 2016-03-30 ENCOUNTER — Ambulatory Visit: Payer: Self-pay | Admitting: Family Medicine

## 2016-04-11 ENCOUNTER — Ambulatory Visit (INDEPENDENT_AMBULATORY_CARE_PROVIDER_SITE_OTHER): Payer: 59 | Admitting: Neurology

## 2016-04-11 ENCOUNTER — Encounter: Payer: Self-pay | Admitting: Neurology

## 2016-04-11 DIAGNOSIS — G47411 Narcolepsy with cataplexy: Secondary | ICD-10-CM

## 2016-04-11 MED ORDER — AMPHETAMINE-DEXTROAMPHETAMINE 30 MG PO TABS: 30.0000 mg | ORAL_TABLET | Freq: Two times a day (BID) | ORAL | 0 refills | Status: DC

## 2016-04-11 MED ORDER — AMPHETAMINE-DEXTROAMPHETAMINE 10 MG PO TABS: ORAL_TABLET | ORAL | 0 refills | Status: DC

## 2016-04-11 NOTE — Patient Instructions (Signed)
You are doing well on the current regimen.  We will continue with the Xyrem too.
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
## 2016-05-01 ENCOUNTER — Encounter: Payer: Self-pay | Admitting: Family Medicine

## 2016-05-10 ENCOUNTER — Other Ambulatory Visit: Payer: Self-pay | Admitting: Family Medicine

## 2016-05-10 DIAGNOSIS — F411 Generalized anxiety disorder: Secondary | ICD-10-CM

## 2016-05-15 ENCOUNTER — Encounter: Payer: Self-pay | Admitting: Neurology

## 2016-05-15 DIAGNOSIS — G47411 Narcolepsy with cataplexy: Secondary | ICD-10-CM

## 2016-05-16 MED ORDER — AMPHETAMINE-DEXTROAMPHETAMINE 10 MG PO TABS: ORAL_TABLET | ORAL | 0 refills | Status: DC

## 2016-05-16 MED ORDER — AMPHETAMINE-DEXTROAMPHETAMINE 30 MG PO TABS: 30.0000 mg | ORAL_TABLET | Freq: Two times a day (BID) | ORAL | 0 refills | Status: DC

## 2016-05-18 ENCOUNTER — Other Ambulatory Visit: Payer: Self-pay

## 2016-05-18 ENCOUNTER — Encounter: Payer: Self-pay | Admitting: Family Medicine

## 2016-05-18 MED ORDER — BUPROPION HCL ER (XL) 300 MG PO TB24: 300.0000 mg | ORAL_TABLET | Freq: Every day | ORAL | 2 refills | Status: DC

## 2016-05-23 ENCOUNTER — Other Ambulatory Visit: Payer: Self-pay | Admitting: Obstetrics and Gynecology

## 2016-05-23 DIAGNOSIS — R928 Other abnormal and inconclusive findings on diagnostic imaging of breast: Secondary | ICD-10-CM

## 2016-05-26 ENCOUNTER — Ambulatory Visit
Admission: RE | Admit: 2016-05-26 | Discharge: 2016-05-26 | Disposition: A | Discharge status: Home / Self Care | Payer: 59 | Source: Ambulatory Visit | Attending: Obstetrics and Gynecology | Admitting: Obstetrics and Gynecology

## 2016-05-26 DIAGNOSIS — R928 Other abnormal and inconclusive findings on diagnostic imaging of breast: Secondary | ICD-10-CM

## 2016-06-19 ENCOUNTER — Encounter: Payer: Self-pay | Admitting: Neurology

## 2016-06-19 ENCOUNTER — Telehealth: Payer: Self-pay

## 2016-06-19 DIAGNOSIS — G47411 Narcolepsy with cataplexy: Secondary | ICD-10-CM

## 2016-06-19 MED ORDER — AMPHETAMINE-DEXTROAMPHETAMINE 30 MG PO TABS: 30.0000 mg | ORAL_TABLET | Freq: Two times a day (BID) | ORAL | 0 refills | Status: DC

## 2016-06-19 MED ORDER — AMPHETAMINE-DEXTROAMPHETAMINE 10 MG PO TABS: ORAL_TABLET | ORAL | 0 refills | Status: DC

## 2016-06-19 NOTE — Telephone Encounter (Signed)
Refill request through MyChart. I will print for work in doctor to sign. Patient is aware that it will be ready at front desk for pick up before the end of the day.

## 2016-07-20 ENCOUNTER — Encounter: Payer: Self-pay | Admitting: Neurology

## 2016-07-20 ENCOUNTER — Other Ambulatory Visit: Payer: Self-pay

## 2016-07-20 ENCOUNTER — Telehealth: Payer: Self-pay

## 2016-07-20 DIAGNOSIS — G47411 Narcolepsy with cataplexy: Secondary | ICD-10-CM

## 2016-07-20 MED ORDER — AMPHETAMINE-DEXTROAMPHETAMINE 10 MG PO TABS
ORAL_TABLET | ORAL | 0 refills | Status: DC
Start: 2016-07-20 — End: 2016-09-04

## 2016-07-20 MED ORDER — AMPHETAMINE-DEXTROAMPHETAMINE 30 MG PO TABS: 30.0000 mg | ORAL_TABLET | Freq: Two times a day (BID) | ORAL | 0 refills | Status: DC

## 2016-07-20 NOTE — Telephone Encounter (Signed)
I called pt to advise her that her RX for adderall is ready for pick up at the front desk. No answer, left a message asking her to call me back. If pt calls back, please advise her of this information and of clinic hours.

## 2016-07-20 NOTE — Telephone Encounter (Signed)
Adderall RXs last filled on 06/19/2016. Pt has an appt with Dr. Frances FurbishAthar on 08/01/2016.

## 2016-08-01 ENCOUNTER — Ambulatory Visit (INDEPENDENT_AMBULATORY_CARE_PROVIDER_SITE_OTHER): Payer: 59 | Admitting: Neurology

## 2016-08-01 ENCOUNTER — Encounter: Payer: Self-pay | Admitting: Neurology

## 2016-08-01 ENCOUNTER — Other Ambulatory Visit: Payer: Self-pay | Admitting: Family Medicine

## 2016-08-01 VITALS — BP 104/70 | HR 76 | Resp 20 | Ht 60.0 in | Wt 144.0 lb

## 2016-08-01 DIAGNOSIS — G47411 Narcolepsy with cataplexy: Secondary | ICD-10-CM

## 2016-08-01 DIAGNOSIS — R519 Headache, unspecified: Secondary | ICD-10-CM

## 2016-08-01 DIAGNOSIS — R51 Headache: Secondary | ICD-10-CM

## 2016-08-01 NOTE — Patient Instructions (Addendum)
As discussed, we will increase your Xyrem to 3.75 g for both nightly doses. You can start tonight.  As discussed, Xyrem has to be taken with very mindful caution: Taking Xyrem correctly is key. This means, take it only when you are fully ready to fall asleep, while in bed and refrain from doing any other activities, even brushing  your teeth after taking your first dose. The second dose will be about 2-1/2-4 hours after his first dose. You can go to the bathroom before your 2nd dose. Take your first dose, when actually IN BED, ready to sleep. No sitting up in bed, NO reading, NO using the cell phone or computer, NO getting up to use the bathroom. Take care of everything BEFORE sleep time. Try NOT to skip the second dose as the Xyrem is not going to stay in your system long enough with only one dose. Do not drink alcohol with Xyrem. If you do drink Alcohol, you cannot take your Xyrem doses that night.   Do not drive and use your phone or text!   We will keep your Adderall XR and IR the same.
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
## 2016-08-03 NOTE — Telephone Encounter (Signed)
Meds refilled on 07/20/16.

## 2016-09-01 ENCOUNTER — Encounter: Payer: Self-pay | Admitting: Family Medicine

## 2016-09-04 ENCOUNTER — Other Ambulatory Visit: Payer: Self-pay

## 2016-09-04 ENCOUNTER — Telehealth: Payer: Self-pay

## 2016-09-04 ENCOUNTER — Encounter: Payer: Self-pay | Admitting: Neurology

## 2016-09-04 DIAGNOSIS — G47411 Narcolepsy with cataplexy: Secondary | ICD-10-CM

## 2016-09-04 MED ORDER — AMPHETAMINE-DEXTROAMPHETAMINE 10 MG PO TABS: ORAL_TABLET | ORAL | 0 refills | Status: DC

## 2016-09-04 MED ORDER — BUPROPION HCL ER (XL) 300 MG PO TB24: 300.0000 mg | ORAL_TABLET | Freq: Every day | ORAL | 2 refills | Status: DC

## 2016-09-04 MED ORDER — AMPHETAMINE-DEXTROAMPHETAMINE 30 MG PO TABS: 30.0000 mg | ORAL_TABLET | Freq: Two times a day (BID) | ORAL | 0 refills | Status: DC

## 2016-09-04 NOTE — Telephone Encounter (Signed)
Refills printed, waiting for signature. Once signed I will let Herbert SetaHeather know, through MyChart, that Rx is up at the front desk ready for pick up.

## 2016-10-02 ENCOUNTER — Encounter: Payer: Self-pay | Admitting: Neurology

## 2016-10-02 ENCOUNTER — Encounter: Payer: Self-pay | Admitting: Family Medicine

## 2016-10-02 ENCOUNTER — Ambulatory Visit (INDEPENDENT_AMBULATORY_CARE_PROVIDER_SITE_OTHER): Payer: 59 | Admitting: Family Medicine

## 2016-10-02 DIAGNOSIS — E559 Vitamin D deficiency, unspecified: Secondary | ICD-10-CM | POA: Insufficient documentation

## 2016-10-02 DIAGNOSIS — F411 Generalized anxiety disorder: Secondary | ICD-10-CM

## 2016-10-02 DIAGNOSIS — F424 Excoriation (skin-picking) disorder: Secondary | ICD-10-CM | POA: Diagnosis not present

## 2016-10-02 MED ORDER — DULOXETINE HCL 60 MG PO CPEP: 60.0000 mg | ORAL_CAPSULE | Freq: Every day | ORAL | 2 refills | Status: DC

## 2016-10-02 MED ORDER — BUPROPION HCL ER (XL) 300 MG PO TB24: 300.0000 mg | ORAL_TABLET | Freq: Every day | ORAL | 2 refills | Status: DC

## 2016-10-02 NOTE — Progress Notes (Signed)
HPI:   Cynthia [name] is a 46 y.o. female, who is here today to follow on some chronic medical problems.  I last saw her on 03/28/17 because anxiety, skin-picking disorder with neurodermatitis.   She was started on Wellbutrin 02/2016 and has been titrated up , now she is on Wellbutrin XL 300 mg. She is also on Cymbalta 60 mg daily, which was slowly decreased from 120 mg daily. She has tried other medications in the past: Celexa,Prozac, and Remeron among some.   She is tolerating medication well. She is reporting improvement in mood, "improved function" during the day, able to deal better with stressful situations. She has also noted decreased in picking skin behavior, not resolved. She is on Adderall, which I thought may be worsening picking behavior.  She denies side effects. Denies suicidal thoughts.   She has Hx of narcolepsy, follows with Dr Frances Furbish, who she has seen twice since her last OV.  Reviewing prior labs noted hypercalcemia.  Lab Results  Component Value Date   CREATININE 0.83 11/24/2015   BUN 19 11/24/2015   NA 137 11/24/2015   K 3.5 11/24/2015   CL 100 11/24/2015   CO2 30 11/24/2015    Ca++ 10.6  Lab Results  Component Value Date   TSH 2.27 11/24/2015    Denies abdominal pain, nausea, vomiting, changes in bowel habits, numbness, tingling, or unusual headache.    No new concerns today.  She also sees her gyn annually.   Review of Systems  Constitutional: Positive for fatigue (no more than usual.). Negative for appetite change and fever.  HENT: Negative for mouth sores, nosebleeds and sore throat.   Respiratory: Negative for cough, shortness of breath and wheezing.   Cardiovascular: Negative for chest pain, palpitations and leg swelling.  Gastrointestinal: Negative for abdominal pain, nausea and vomiting.       No changes in bowel habits.  Musculoskeletal: Negative for gait problem and myalgias.  Skin: Negative for rash.    Neurological: Negative for dizziness, tremors, seizures and headaches.  Psychiatric/Behavioral: Negative for confusion, hallucinations and suicidal ideas. The patient is nervous/anxious.       Current Outpatient Prescriptions on File Prior to Visit  Medication Sig Dispense Refill  . amphetamine-dextroamphetamine (ADDERALL) 10 MG tablet Daily at 11 AM. Along with 30 mg bid, separate Rx 30 tablet 0  . amphetamine-dextroamphetamine (ADDERALL) 30 MG tablet Take 1 tablet by mouth 2 (two) times daily with a meal. 60 tablet 0  . Sodium Oxybate (XYREM) 500 MG/ML SOLN Take 3.75gms for 1st nightly dose and 3 gms for 2nd nightly dose.  (Total dose 6.75 grams nightly) 270 mL 5  . triamcinolone (KENALOG) 0.025 % ointment Apply 1 application topically 2 (two) times daily. 45 g 2   No current facility-administered medications on file prior to visit.      Past Medical History:  Diagnosis Date  . AC separation 09/2014   left shoulder  . Anxiety   . Carpal tunnel syndrome of right wrist   . Complication of anesthesia   . Dental crowns present   . GERD (gastroesophageal reflux disease)    Gaviscon as needed  . Migraines   . Narcolepsy   . PONV (postoperative nausea and vomiting)    nausea   Allergies  Allergen Reactions  . Meclizine Hcl Other (See Comments)    SEVERE HEARTBURN  . Morphine Itching  . Percocet [Oxycodone-Acetaminophen] Itching    Social History   Social History  .  Marital status: Married    Spouse name: Jorja Loaim  . Number of children: 2  . Years of education: Bachelors   Occupational History  . 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 Smoking status: Former Smoker    Packs/day: 0.00    Years: 0.00    Quit date: 08/13/2005  . Smokeless tobacco: Never Used  . Alcohol use No  . Drug use: No  . Sexual activity: Not Asked   Other Topics Concern  . None   Social History Narrative   Pt lives at home with husband [name]([name] aka Charles) and two  children   Pt is right handed    Education - Bachelors degree   Caffeine 1-2 cups a day    Vitals:   10/02/16 1556  BP: 102/68  Pulse: 88  Resp: 12   Body mass index is 27.54 kg/m.   Physical Exam  Nursing note and vitals reviewed. Constitutional: She is oriented to person, place, and time. She appears well-developed and well-nourished. No distress.  HENT:  Head: Atraumatic.  Mouth/Throat: Oropharynx is clear and moist and mucous membranes are normal.  Eyes: Conjunctivae and EOM are normal. Pupils are equal, round, and reactive to light.  Neck: No thyroid mass present.  Cardiovascular: Normal rate and regular rhythm.   No murmur heard. Pulses:      Dorsalis pedis pulses are 2+ on the right side, and 2+ on the left side.  Respiratory: Effort normal and breath sounds normal. No respiratory distress.  GI: Soft. She exhibits no mass. There is no hepatomegaly. There is no tenderness.  Musculoskeletal: She exhibits no edema or tenderness.  Lymphadenopathy:    She has no cervical adenopathy.  Neurological: She is alert and oriented to person, place, and time. She has normal strength. Coordination normal.  Skin: Skin is warm. No erythema.  Psychiatric: She has a normal mood and affect.  Well groomed, good eye contact.      ASSESSMENT AND PLAN:   Herbert SetaHeather was seen today for follow-up.  Diagnoses and all orders for this visit:  Hypercalcemia  Mild. Further recommendations will be given according to lab results.  -     Calcium, ionized -     VITAMIN D 25 Hydroxy (Vit-D Deficiency, Fractures) -     Comprehensive metabolic panel  Generalized anxiety disorder  Improved. No changes in current management. F/U in 12 months, before if needed.  -     buPROPion (WELLBUTRIN XL) 300 MG 24 hr tablet; Take 1 tablet (300 mg total) by mouth daily. -     DULoxetine (CYMBALTA) 60 MG capsule; Take 1 capsule (60 mg total) by mouth daily.  Skin-picking disorder  Improved, not  resolved. She has tried other treatments with no improvement. Skin care discussed.     Since she follows q 4 months with Dr Frances FurbishAthar and follows with gyn annually, I thnk it is appropriate to follow annually with me, given the fact anxiety is better controlled. She understands plan and agrees.     -Ms. Leata MouseHeather M Salomon was advised to return sooner than planned today if new concerns arise.       Seneca Gadbois G. Swaz[place], MD  [facility]. [place] office.

## 2016-10-02 NOTE — Progress Notes (Signed)
Pre visit review using our clinic review tool, if applicable. No additional management support is needed unless otherwise documented below in the visit note. 

## 2016-10-03 ENCOUNTER — Encounter: Payer: Self-pay | Admitting: Family Medicine

## 2016-10-03 ENCOUNTER — Other Ambulatory Visit: Payer: Self-pay

## 2016-10-03 DIAGNOSIS — G47411 Narcolepsy with cataplexy: Secondary | ICD-10-CM

## 2016-10-03 LAB — COMPREHENSIVE METABOLIC PANEL
ALT: 15 U/L (ref 0–35)
AST: 12 U/L (ref 0–37)
Albumin: 4.1 g/dL (ref 3.5–5.2)
Alkaline Phosphatase: 55 U/L (ref 39–117)
BUN: 15 mg/dL (ref 6–23)
CO2: 29 meq/L (ref 19–32)
CREATININE: 0.83 mg/dL (ref 0.40–1.20)
Calcium: 9.7 mg/dL (ref 8.4–10.5)
Chloride: 105 mEq/L (ref 96–112)
GFR: 79.66 mL/min (ref >60.00)
GLUCOSE: 90 mg/dL (ref 70–99)
Potassium: 4 mEq/L (ref 3.5–5.1)
Sodium: 138 mEq/L (ref 135–145)
TOTAL PROTEIN: 7.1 g/dL (ref 6.0–8.3)
Total Bilirubin: 0.3 mg/dL (ref 0.2–1.2)

## 2016-10-03 LAB — VITAMIN D 25 HYDROXY (VIT D DEFICIENCY, FRACTURES): VITD: 19.43 ng/mL — AB (ref 30.00–100.00)

## 2016-10-03 LAB — CALCIUM, IONIZED: CALCIUM ION: 5.5 mg/dL (ref 4.8–5.6)

## 2016-10-03 MED ORDER — AMPHETAMINE-DEXTROAMPHETAMINE 10 MG PO TABS: ORAL_TABLET | ORAL | 0 refills | Status: DC

## 2016-10-03 MED ORDER — AMPHETAMINE-DEXTROAMPHETAMINE 30 MG PO TABS: 30.0000 mg | ORAL_TABLET | Freq: Two times a day (BID) | ORAL | 0 refills | Status: DC

## 2016-10-03 NOTE — Telephone Encounter (Signed)
Pt is up to date on appts and is due for her adderall RXs to be refilled. RXs placed on Dr. Teofilo PodAthar's desk to be signed.

## 2016-10-09 ENCOUNTER — Telehealth: Payer: Self-pay

## 2016-10-09 NOTE — Telephone Encounter (Signed)
The DMV is requesting an letter on a letterhead that states the medications being used to treat pt's narcolepsy, and if the condition is stable.  "Mrs. Cynthia RadonHeather [name] [facility]. I have prescribed adderall 10mg , adderall 30 mg, and xyrem (sodium oxybate) to control her narcolepsy. Her condition is stable on these medications."  Is this ok?

## 2016-10-09 NOTE — Telephone Encounter (Signed)
I received forms for pt for the DMV. However, only the first 2 pages were provided; I will need the next 6 or 7 pages that the physician is to fill out.  I called pt to discuss. No answer, left a message asking her to call me back.

## 2016-10-09 NOTE — Telephone Encounter (Signed)
Please add that "she is compliant with medications and her follow up appointments".

## 2016-10-09 NOTE — Telephone Encounter (Signed)
I called pt. I advised her that the letter for the [facility] is ready. She asked me to fax it to the number located on the information from the DMV.[phone]  Faxed to the [facility]. Received a receipt of confirmation.

## 2016-11-03 ENCOUNTER — Encounter: Payer: Self-pay | Admitting: Neurology

## 2016-11-03 DIAGNOSIS — G47411 Narcolepsy with cataplexy: Secondary | ICD-10-CM

## 2016-11-03 MED ORDER — AMPHETAMINE-DEXTROAMPHETAMINE 30 MG PO TABS: 30.0000 mg | ORAL_TABLET | Freq: Two times a day (BID) | ORAL | 0 refills | Status: DC

## 2016-11-03 MED ORDER — AMPHETAMINE-DEXTROAMPHETAMINE 10 MG PO TABS: ORAL_TABLET | ORAL | 0 refills | Status: DC

## 2016-11-06 ENCOUNTER — Encounter: Payer: Self-pay | Admitting: Neurology

## 2016-11-30 ENCOUNTER — Encounter: Payer: Self-pay | Admitting: Neurology

## 2016-11-30 ENCOUNTER — Ambulatory Visit (INDEPENDENT_AMBULATORY_CARE_PROVIDER_SITE_OTHER): Payer: 59 | Admitting: Neurology

## 2016-11-30 VITALS — BP 102/58 | HR 77 | Ht 60.0 in | Wt 136.0 lb

## 2016-11-30 DIAGNOSIS — G47411 Narcolepsy with cataplexy: Secondary | ICD-10-CM

## 2016-11-30 MED ORDER — AMPHETAMINE-DEXTROAMPHETAMINE 10 MG PO TABS: ORAL_TABLET | ORAL | 0 refills | Status: DC

## 2016-11-30 MED ORDER — AMPHETAMINE-DEXTROAMPHETAMINE 30 MG PO TABS: 30.0000 mg | ORAL_TABLET | Freq: Two times a day (BID) | ORAL | 0 refills | Status: DC

## 2016-11-30 NOTE — Patient Instructions (Addendum)
As discussed, Xyrem has to be taken with very mindful caution: Taking Xyrem correctly is key. This means, take it only when you are fully ready to fall asleep, while in bed and refrain from doing any other activities, even brushing  your teeth after taking your first dose. The second dose will be about 2-1/2-4 hours after his first dose. You can go to the bathroom before your 2nd dose. Take your first dose, when actually IN BED, ready to sleep. No sitting up in bed, NO reading, NO using the cell phone or computer, NO getting up to use the bathroom. Take care of everything BEFORE sleep time. Try NOT to skip the second dose as the Xyrem is not going to stay in your system long enough with only one dose. Do not drink alcohol with Xyrem. If you do drink Alcohol, you cannot take your Xyrem doses that night.  We will continue with Xyrem 3.75 g twice each night.  We will continue with your Adderall.
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
## 2017-01-02 ENCOUNTER — Telehealth: Payer: Self-pay

## 2017-01-02 ENCOUNTER — Encounter: Payer: Self-pay | Admitting: Neurology

## 2017-01-02 DIAGNOSIS — G47411 Narcolepsy with cataplexy: Secondary | ICD-10-CM

## 2017-01-02 MED ORDER — AMPHETAMINE-DEXTROAMPHETAMINE 10 MG PO TABS: ORAL_TABLET | ORAL | 0 refills | Status: DC

## 2017-01-02 MED ORDER — AMPHETAMINE-DEXTROAMPHETAMINE 30 MG PO TABS: 30.0000 mg | ORAL_TABLET | Freq: Two times a day (BID) | ORAL | 0 refills | Status: DC

## 2017-01-02 NOTE — Telephone Encounter (Signed)
Patient emailed us refill request for her Adderall. Patient is up to date on appts and Rxs are due. Printed and waiting on signature. Once signed, I will take to front desk

## 2017-02-06 ENCOUNTER — Encounter: Payer: Self-pay | Admitting: Neurology

## 2017-02-06 ENCOUNTER — Other Ambulatory Visit: Payer: Self-pay

## 2017-02-06 DIAGNOSIS — G47411 Narcolepsy with cataplexy: Secondary | ICD-10-CM

## 2017-02-06 MED ORDER — SODIUM OXYBATE 500 MG/ML PO SOLN: ORAL | 5 refills | Status: DC

## 2017-02-06 MED ORDER — AMPHETAMINE-DEXTROAMPHETAMINE 10 MG PO TABS: ORAL_TABLET | ORAL | 0 refills | Status: DC

## 2017-02-06 MED ORDER — AMPHETAMINE-DEXTROAMPHETAMINE 30 MG PO TABS: 30.0000 mg | ORAL_TABLET | Freq: Two times a day (BID) | ORAL | 0 refills | Status: DC

## 2017-02-06 NOTE — Telephone Encounter (Signed)
Pt sent a mychart message requesting a refill on adderall and xyrem. Will send to Dr. Vickey Hugerohmeier for review in Dr. Teofilo PodAthar's absence.

## 2017-02-06 NOTE — Addendum Note (Signed)
Addended by: Geronimo RunningINKINS, Danniella Robben A on: 02/06/2017 03:50 PM   Modules accepted: Orders

## 2017-02-06 NOTE — Telephone Encounter (Signed)
Prescriptions printed on plain paper by Dr. Vickey Hugerohmeier. Will reprint on RX paper.

## 2017-02-07 NOTE — Telephone Encounter (Signed)
I responded to pt's mychart message regarding adderall. [facility].

## 2017-03-19 ENCOUNTER — Encounter: Payer: Self-pay | Admitting: Neurology

## 2017-03-19 ENCOUNTER — Telehealth: Payer: Self-pay

## 2017-03-19 DIAGNOSIS — G47411 Narcolepsy with cataplexy: Secondary | ICD-10-CM

## 2017-03-19 MED ORDER — AMPHETAMINE-DEXTROAMPHETAMINE 10 MG PO TABS: ORAL_TABLET | ORAL | 0 refills | Status: DC

## 2017-03-19 MED ORDER — AMPHETAMINE-DEXTROAMPHETAMINE 30 MG PO TABS: 30.0000 mg | ORAL_TABLET | Freq: Two times a day (BID) | ORAL | 0 refills | Status: DC

## 2017-03-19 MED ORDER — SODIUM OXYBATE 500 MG/ML PO SOLN: ORAL | 5 refills | Status: DC

## 2017-03-19 NOTE — Telephone Encounter (Signed)
Dr. Frances FurbishAthar signed all 3 RXs. Will place adderall RXs at front desk and fax xyrem RX to [facility].

## 2017-03-19 NOTE — Telephone Encounter (Signed)
Pt is due for her refills on both adderalls. It appears that when Dr. Vickey Hugerohmeier refilled pt's xyrem, it was ordered as 3.75g and then 3 g at night. Dr. Teofilo PodAthar's last note with the pt says that pt should be on 3.75g twice nightly. Will correct xyrem in the med list and send in a new RX.  Will print all 3 RXs and give to Dr. Frances FurbishAthar for review and signature.

## 2017-03-21 NOTE — Telephone Encounter (Signed)
Raven calling re: the Xyrem, she states they are unable to fill  The Xyrem until they have a copy of the medication list .  Brett AlbinoRaven is requesting that it be faxed to [phone], if there are questions re: this request please call [phone] option 3 then option 4

## 2017-03-21 NOTE — Telephone Encounter (Signed)
Med list faxed to [facility]. Received a receipt of confirmation.

## 2017-04-22 DIAGNOSIS — Z23 Encounter for immunization: Secondary | ICD-10-CM | POA: Diagnosis not present

## 2017-05-01 ENCOUNTER — Encounter: Payer: Self-pay | Admitting: Neurology

## 2017-05-02 ENCOUNTER — Other Ambulatory Visit: Payer: Self-pay

## 2017-05-02 DIAGNOSIS — G47411 Narcolepsy with cataplexy: Secondary | ICD-10-CM

## 2017-05-02 MED ORDER — AMPHETAMINE-DEXTROAMPHETAMINE 10 MG PO TABS: ORAL_TABLET | ORAL | 0 refills | Status: DC

## 2017-05-02 MED ORDER — AMPHETAMINE-DEXTROAMPHETAMINE 30 MG PO TABS: 30.0000 mg | ORAL_TABLET | Freq: Two times a day (BID) | ORAL | 0 refills | Status: DC

## 2017-05-02 NOTE — Telephone Encounter (Signed)
Pt sent a my chart message requesting refills on her adderall. Pt is due for a refill on her adderall and is up to date on her appts. RXs printed and will give to Dr. Frances Furbish for review and signature.

## 2017-05-02 NOTE — Telephone Encounter (Signed)
Dr. Frances Furbish signed RXs, placed at front for pick up.

## 2017-05-03 ENCOUNTER — Encounter: Payer: Self-pay | Admitting: Family Medicine

## 2017-05-17 ENCOUNTER — Encounter: Payer: Self-pay | Admitting: Family Medicine

## 2017-05-18 ENCOUNTER — Other Ambulatory Visit: Payer: Self-pay | Admitting: Family Medicine

## 2017-05-18 DIAGNOSIS — F411 Generalized anxiety disorder: Secondary | ICD-10-CM

## 2017-05-23 ENCOUNTER — Ambulatory Visit (INDEPENDENT_AMBULATORY_CARE_PROVIDER_SITE_OTHER): Payer: 59 | Admitting: Family Medicine

## 2017-05-23 ENCOUNTER — Encounter: Payer: Self-pay | Admitting: Family Medicine

## 2017-05-23 VITALS — BP 100/70 | HR 99 | Resp 12 | Ht 60.0 in | Wt 128.0 lb

## 2017-05-23 DIAGNOSIS — L03011 Cellulitis of right finger: Secondary | ICD-10-CM

## 2017-05-23 MED ORDER — SULFAMETHOXAZOLE-TRIMETHOPRIM 800-160 MG PO TABS: 1.0000 | ORAL_TABLET | Freq: Two times a day (BID) | ORAL | 0 refills | Status: DC

## 2017-05-23 NOTE — Patient Instructions (Signed)
A few things to remember from today's visit:   Paronychia of finger, right - Plan: sulfamethoxazole-trimethoprim (BACTRIM DS,SEPTRA DS) 800-160 MG tablet  Paronychia Paronychia is an infection of the skin that surrounds a nail. It usually affects the skin around a fingernail, but it may also occur near a toenail. It often causes pain and swelling around the nail. This condition may come on suddenly or develop over a longer period. In some cases, a collection of pus (abscess) can form near or under the nail. Usually, paronychia is not serious and it clears up with treatment. What are the causes? This condition may be caused by bacteria or fungi. It is commonly caused by either Streptococcus or Staphylococcus bacteria. The bacteria or fungi often cause the infection by getting into the affected area through an opening in the skin, such as a cut or a hangnail. What increases the risk? This condition is more likely to develop in:  People who get their hands wet often, such as those who work as Fish farm manager, bartenders, or nurses.  People who bite their fingernails or suck their thumbs.  People who trim their nails too short.  People who have hangnails or injured fingertips.  People who get manicures.  People who have diabetes.  What are the signs or symptoms? Symptoms of this condition include:  Redness and swelling of the skin near the nail.  Tenderness around the nail when you touch the area.  Pus-filled bumps under the cuticle. The cuticle is the skin at the base or sides of the nail.  Fluid or pus under the nail.  Throbbing pain in the area.  How is this diagnosed? This condition is usually diagnosed with a physical exam. In some cases, a sample of pus may be taken from an abscess to be tested in a lab. This can help to determine what type of bacteria or fungi is causing the condition. How is this treated? Treatment for this condition depends on the cause and severity of the  condition. If the condition is mild, it may clear up on its own in a few days. Your health care provider may recommend soaking the affected area in warm water a few times a day. When treatment is needed, the options may include:  Antibiotic medicine, if the condition is caused by a bacterial infection.  Antifungal medicine, if the condition is caused by a fungal infection.  Incision and drainage, if an abscess is present. In this procedure, the health care provider will cut open the abscess so the pus can drain out.  Follow these instructions at home:  Soak the affected area in warm water if directed to do so by your health care provider. You may be told to do this for 20 minutes, 2-3 times a day. Keep the area dry in between soakings.  Take medicines only as directed by your health care provider.  If you were prescribed an antibiotic medicine, finish all of it even if you start to feel better.  Keep the affected area clean.  Do not try to drain a fluid-filled bump yourself.  If you will be washing dishes or performing other tasks that require your hands to get wet, wear rubber gloves. You should also wear gloves if your hands might come in contact with irritating substances, such as cleaners or chemicals.  Follow your health care provider's instructions about: ? Wound care. ? Bandage (dressing) changes and removal. Contact a health care provider if:  Your symptoms get worse or do  not improve with treatment.  You have a fever or chills.  You have redness spreading from the affected area.  You have continued or increased fluid, blood, or pus coming from the affected area.  Your finger or knuckle becomes swollen or is difficult to move. This information is not intended to replace advice given to you by your health care provider. Make sure you discuss any questions you have with your health care provider. Document Released: 01/24/2001 Document Revised: 01/06/2016 Document Reviewed:  07/08/2014 Elsevier Interactive Patient Education  [employer].  Please be sure medication list is accurate. If a new problem present, please set up appointment sooner than planned today.

## 2017-05-23 NOTE — Progress Notes (Signed)
ACUTE VISIT   HPI:  Chief Complaint  Patient presents with  . finger nail problem    [name] is a 46 y.o. female, who is here today complaining of 2-3 weeks of periungual edema and erythema, developed after manicure. 2-3 weeks ago when she had manicure and while old nail polish was being removed, cuticle of 5th right finger was "nicked" with a small drill.  States that this happens with same finger every time she has her nails done but usually she has mild local erythema that resolves in a couple days, this time it is getting worse.   She denies fever,chills,myalgias, or worsening fatigue.  Fingernail is not compromised.  She describes pain as a "hurt itching", which improved after she drained purulent material yesterday, before it was "pretty bad."  Sh has used topical steroid but has not help.  No associated arthralgia or limitation of ROM.    Review of Systems  Constitutional: Positive for fatigue (No more than usual). Negative for appetite change, chills and fever.  HENT: Negative for mouth sores, sore throat and trouble swallowing.   Respiratory: Negative for shortness of breath and wheezing.   Cardiovascular: Negative for leg swelling.  Gastrointestinal: Negative for abdominal pain and nausea.  Musculoskeletal: Negative for gait problem, joint swelling and myalgias.  Skin: Positive for rash. Negative for pallor.  Neurological: Negative for weakness and numbness.  Psychiatric/Behavioral: Negative for confusion. The patient is nervous/anxious.       Current Outpatient Prescriptions on File Prior to Visit  Medication Sig Dispense Refill  . amphetamine-dextroamphetamine (ADDERALL) 10 MG tablet Daily at 11 AM. Along with 30 mg bid, separate Rx 30 tablet 0  . amphetamine-dextroamphetamine (ADDERALL) 30 MG tablet Take 1 tablet by mouth 2 (two) times daily with a meal. 60 tablet 0  . buPROPion (WELLBUTRIN XL) 300 MG 24 hr tablet Take 1 tablet (300 mg  total) by mouth daily. 90 tablet 2  . DULoxetine (CYMBALTA) 60 MG capsule TAKE 1 CAPSULE DAILY 90 capsule 1  . Sodium Oxybate (XYREM) 500 MG/ML SOLN Take 3.75gms for 1st nightly dose and 3.75 gms for 2nd nightly dose.  (Total dose 7.5grams nightly) 270 mL 5  . triamcinolone (KENALOG) 0.025 % ointment Apply 1 application topically 2 (two) times daily. 45 g 2   No current facility-administered medications on file prior to visit.      Past Medical History:  Diagnosis Date  . AC separation 09/2014   left shoulder  . Anxiety   . Carpal tunnel syndrome of right wrist   . Complication of anesthesia   . Dental crowns present   . GERD (gastroesophageal reflux disease)    Gaviscon as needed  . Migraines   . Narcolepsy   . PONV (postoperative nausea and vomiting)    nausea   Allergies  Allergen Reactions  . Meclizine Hcl Other (See Comments)    SEVERE HEARTBURN  . Morphine Itching  . Percocet [Oxycodone-Acetaminophen] Itching    Social History   Social History  . Marital status: Married    Spouse name: Cynthia [name]  . Number of children: 2  . Years of education: Bachelors   Occupational History  . 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 Smoking status: Former Smoker    Packs/day: 0.00    Years: 0.00    Quit date: 08/13/2005  . Smokeless tobacco: Never Used  . Alcohol use No  . Drug use: No  .  Sexual activity: Not Asked   Other Topics Concern  . None   Social History Narrative   Pt lives at home with husband Cynthia [name]) and two children   Pt is right handed    Education - Bachelors degree   Caffeine 1-2 cups a day    Vitals:   05/23/17 1551  BP: 100/70  Pulse: 99  Resp: 12  SpO2: 98%   Body mass index is 25 kg/m.   Physical Exam  Nursing note and vitals reviewed. Constitutional: She is oriented to person, place, and time. She appears well-developed and well-nourished. No distress.  HENT:  Head: Normocephalic and atraumatic.    Mouth/Throat: Oropharynx is clear and moist and mucous membranes are normal.  Eyes: Conjunctivae are normal.  Cardiovascular: Normal rate and regular rhythm.   Pulses:      Radial pulses are 2+ on the right side.  Respiratory: Effort normal and breath sounds normal. No respiratory distress.  Musculoskeletal: She exhibits no edema or tenderness.       Right hand: She exhibits swelling. She exhibits normal range of motion, no bony tenderness and normal capillary refill. Normal strength noted.  Lymphadenopathy:    She has no cervical adenopathy.  Neurological: She is alert and oriented to person, place, and time. She has normal strength. Gait normal.  Skin: Skin is warm. No rash noted. There is erythema.  Periungual edema and erythema involving right 5th finger. No active drainage, mild tenderness with palpation. Minimal local heat. Fingernail smooth.  Psychiatric: Her mood appears anxious.  Well groomed, good eye contact.      ASSESSMENT AND PLAN:   Cynthia [name] was seen today for finger nail problem.  Diagnoses and all orders for this visit:  Paronychia of finger, right -     sulfamethoxazole-trimethoprim (BACTRIM DS,SEPTRA DS) 800-160 MG tablet; Take 1 tablet by mouth 2 (two) times daily.   Possible etiologies discussed. I will treat as bacterial with Bactrim. Keep area clean with soap and water. Soak finger in Epson salt and warm water a couple times per day. Recommend considering stopping manicure for a while until problem resolves and to avoid recurrent trauma. Clearly instructed about warning signs.      -[name] was advised to seek immediate medical attention if sudden worsening symptoms or to follow if they persist or if new concerns arise.       Betty G. [place], MD  [facility]. [place] office.

## 2017-06-04 ENCOUNTER — Encounter: Payer: Self-pay | Admitting: Family Medicine

## 2017-06-05 ENCOUNTER — Encounter: Payer: Self-pay | Admitting: Neurology

## 2017-06-05 ENCOUNTER — Ambulatory Visit (INDEPENDENT_AMBULATORY_CARE_PROVIDER_SITE_OTHER): Payer: 59 | Admitting: Neurology

## 2017-06-05 DIAGNOSIS — G47411 Narcolepsy with cataplexy: Secondary | ICD-10-CM | POA: Diagnosis not present

## 2017-06-05 MED ORDER — AMPHETAMINE-DEXTROAMPHETAMINE 30 MG PO TABS: 30.0000 mg | ORAL_TABLET | Freq: Two times a day (BID) | ORAL | 0 refills | Status: DC

## 2017-06-05 MED ORDER — AMPHETAMINE-DEXTROAMPHETAMINE 10 MG PO TABS: ORAL_TABLET | ORAL | 0 refills | Status: DC

## 2017-06-05 NOTE — Patient Instructions (Signed)
We will keep your medications the same.  Let's do a follow up in 6 months.
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
## 2017-06-29 DIAGNOSIS — Z1231 Encounter for screening mammogram for malignant neoplasm of breast: Secondary | ICD-10-CM | POA: Diagnosis not present

## 2017-06-29 DIAGNOSIS — Z01419 Encounter for gynecological examination (general) (routine) without abnormal findings: Secondary | ICD-10-CM | POA: Diagnosis not present

## 2017-07-11 ENCOUNTER — Other Ambulatory Visit: Payer: Self-pay

## 2017-07-11 ENCOUNTER — Encounter: Payer: Self-pay | Admitting: Neurology

## 2017-07-11 DIAGNOSIS — G47411 Narcolepsy with cataplexy: Secondary | ICD-10-CM

## 2017-07-11 MED ORDER — AMPHETAMINE-DEXTROAMPHETAMINE 10 MG PO TABS: ORAL_TABLET | ORAL | 0 refills | Status: DC

## 2017-07-11 MED ORDER — AMPHETAMINE-DEXTROAMPHETAMINE 30 MG PO TABS: 30.0000 mg | ORAL_TABLET | Freq: Two times a day (BID) | ORAL | 0 refills | Status: DC

## 2017-07-11 NOTE — Telephone Encounter (Signed)
Pt requesting adderall refills. Pt is up to date on her appts and is due for a refill. RXs printed and given to Dr. Frances FurbishAthar for review.

## 2017-08-14 ENCOUNTER — Encounter: Payer: Self-pay | Admitting: Neurology

## 2017-08-15 ENCOUNTER — Other Ambulatory Visit: Payer: Self-pay

## 2017-08-15 DIAGNOSIS — G47411 Narcolepsy with cataplexy: Secondary | ICD-10-CM

## 2017-08-15 MED ORDER — AMPHETAMINE-DEXTROAMPHETAMINE 30 MG PO TABS: 30.0000 mg | ORAL_TABLET | Freq: Two times a day (BID) | ORAL | 0 refills | Status: DC

## 2017-08-15 MED ORDER — AMPHETAMINE-DEXTROAMPHETAMINE 10 MG PO TABS: ORAL_TABLET | ORAL | 0 refills | Status: DC

## 2017-08-15 NOTE — Telephone Encounter (Signed)
Pt is due for a refill on her adderall RXs and is up to date on her appts. Will print RXs and have Dr. Frances FurbishAthar review and sign.

## 2017-08-29 ENCOUNTER — Other Ambulatory Visit: Payer: Self-pay | Admitting: Family Medicine

## 2017-09-15 ENCOUNTER — Encounter: Payer: Self-pay | Admitting: Neurology

## 2017-09-17 ENCOUNTER — Encounter: Payer: Self-pay | Admitting: Neurology

## 2017-09-17 ENCOUNTER — Other Ambulatory Visit: Payer: Self-pay

## 2017-09-17 DIAGNOSIS — G47411 Narcolepsy with cataplexy: Secondary | ICD-10-CM

## 2017-09-17 MED ORDER — AMPHETAMINE-DEXTROAMPHETAMINE 30 MG PO TABS: 30.0000 mg | ORAL_TABLET | Freq: Two times a day (BID) | ORAL | 0 refills | Status: DC

## 2017-09-17 MED ORDER — AMPHETAMINE-DEXTROAMPHETAMINE 10 MG PO TABS: ORAL_TABLET | ORAL | 0 refills | Status: DC

## 2017-09-17 NOTE — Telephone Encounter (Signed)
Pt is asking via mychart for her adderall to be refilled. Pt is due for refill and is up to date on appts. Dr. Frances FurbishAthar agreed and signed both RXs for adderall.

## 2017-09-26 ENCOUNTER — Encounter: Payer: Self-pay | Admitting: Neurology

## 2017-09-28 ENCOUNTER — Encounter: Payer: Self-pay | Admitting: Neurology

## 2017-10-01 NOTE — Progress Notes (Signed)
HPI:   Cynthia [name] is a 47 y.o. female, who is here today for 12 months follow up.   She was last seen on 05/23/17 for acute visit.  She is currently on Cymbalta 60 mg and Wellbutrin XL 300 mg daily. Anxious in the morning and at the end of the day. She denies depressed mood or suicidal thoughts. No side effects from current medications.  In general symptoms are better that before she started medications and have been stable.  She has Hx of narcolepsy with cataplexy and ADD. She follows with Dr Frances Furbish and currently on Xyrem and Adderall.   Vit D deficiency: Currently she is not on vit D supplementation.  Last 25 OH vit D in 09/2016 was 19.43. Fatigue is stable.  She stopped eating donuts and chocolate and has lost wt,back to her "normal" wt. She is not exercising regularly.    Review of Systems  Constitutional: Positive for fatigue. Negative for activity change and appetite change.  HENT: Negative for mouth sores and sore throat.   Respiratory: Negative for shortness of breath and wheezing.   Cardiovascular: Negative for chest pain, palpitations and leg swelling.  Gastrointestinal: Negative for abdominal pain, diarrhea, nausea and vomiting.  Endocrine: Negative for cold intolerance and heat intolerance.  Skin: Negative for rash.  Neurological: Negative for tremors, seizures and headaches.  Psychiatric/Behavioral: Positive for sleep disturbance. Negative for confusion, hallucinations and suicidal ideas. The patient is nervous/anxious.      Current Outpatient Medications on File Prior to Visit  Medication Sig Dispense Refill  . amphetamine-dextroamphetamine (ADDERALL) 10 MG tablet Daily at 11 AM. Along with 30 mg bid, separate Rx 30 tablet 0  . amphetamine-dextroamphetamine (ADDERALL) 30 MG tablet Take 1 tablet by mouth 2 (two) times daily with a meal. 60 tablet 0  . buPROPion (WELLBUTRIN XL) 300 MG 24 hr tablet Take 1 tablet (300 mg total) by mouth daily. 90  tablet 2  . Sodium Oxybate (XYREM) 500 MG/ML SOLN Take 3.75gms for 1st nightly dose and 3.75 gms for 2nd nightly dose.  (Total dose 7.5grams nightly) 270 mL 5  . triamcinolone (KENALOG) 0.025 % ointment Apply 1 application topically 2 (two) times daily. 45 g 2   No current facility-administered medications on file prior to visit.      Past Medical History:  Diagnosis Date  . AC separation 09/2014   left shoulder  . Anxiety   . Carpal tunnel syndrome of right wrist   . Complication of anesthesia   . Dental crowns present   . GERD (gastroesophageal reflux disease)    Gaviscon as needed  . Migraines   . Narcolepsy   . PONV (postoperative nausea and vomiting)    nausea   Allergies  Allergen Reactions  . Meclizine Hcl Other (See Comments)    SEVERE HEARTBURN  . Morphine Itching  . Percocet [Oxycodone-Acetaminophen] Itching    Social History   Socioeconomic History  . Marital status: Married    Spouse name: [name]  . Number of children: 2  . Years of education: Bachelors  . Highest education level: None  Social Needs  . Financial resource strain: None  . Food insecurity - worry: None  . Food insecurity - inability: None  . Transportation needs - medical: None  . Transportation needs - non-medical: None  Occupational History  . Occupation: Child psychotherapist    Employer: [employer]  Tobacco Use  . Smoking status: Former Smoker    Packs/day:  0.00    Years: 0.00    Pack years: 0.00    Last attempt to quit: 08/13/2005    Years since quitting: 12.1  . Smokeless tobacco: Never Used  Substance and Sexual Activity  . Alcohol use: No    Alcohol/week: 0.0 oz  . Drug use: No  . Sexual activity: None  Other Topics Concern  . None  Social History Narrative   Pt lives at home with husband [name]([name] aka Charles) and two children   Pt is right handed    Education - Bachelors degree   Caffeine 1-2 cups a day    Vitals:   10/02/17 1527  BP: 124/70  Pulse: 74  Resp: 12    Temp: 98.3 F (36.8 C)  SpO2: 99%   Body mass index is 25.24 kg/m.   Physical Exam  Nursing note and vitals reviewed. Constitutional: She is oriented to person, place, and time. She appears well-developed and well-nourished. No distress.  HENT:  Head: Normocephalic.  Mouth/Throat: Oropharynx is clear and moist and mucous membranes are normal.  Eyes: Conjunctivae are normal. Pupils are equal, round, and reactive to light.  Cardiovascular: Normal rate and regular rhythm.  No murmur heard. Respiratory: Effort normal and breath sounds normal. No respiratory distress.  GI: Soft. She exhibits no mass. There is no hepatomegaly. There is no tenderness.  Musculoskeletal: She exhibits no edema.  Lymphadenopathy:    She has no cervical adenopathy.  Neurological: She is alert and oriented to person, place, and time. She has normal strength. Gait normal.  Skin: Skin is warm. No rash noted. No erythema.  Psychiatric: Her speech is normal. Her mood appears anxious. Cognition and memory are normal. She expresses no suicidal ideation. She expresses no suicidal plans.  Well groomed, good eye contact.      ASSESSMENT AND PLAN:   Ms. Cynthia [name] was seen today for 12 months follow-up.  Orders Placed This Encounter  Procedures  . VITAMIN D 25 Hydroxy (Vit-D Deficiency, Fractures)  . Basic metabolic panel   Lab Results  Component Value Date   CREATININE 0.84 10/02/2017   BUN 12 10/02/2017   NA 136 10/02/2017   K 4.1 10/02/2017   CL 99 10/02/2017   CO2 31 10/02/2017     Vitamin D deficiency  No changes in current management, will follow labs done today and will give further recommendations accordingly.  -     VITAMIN D 25 Hydroxy (Vit-D Deficiency, Fractures) -     Basic metabolic panel  Generalized anxiety disorder  In general symptoms are stable. No changes in current management: Cymbalta and Wellbutrin.Some side effects discussed. Instructed about warning signs. F/U in  12 months, before if needed.  -     DULoxetine (CYMBALTA) 60 MG capsule; Take 1 capsule (60 mg total) by mouth daily.     She is following with Dr Frances FurbishAthar a few times per year,so I think it is appropriate to continue annual follow up,she agrees with plan.     -Ms. Cynthia MouseHeather M Amison was advised to return sooner than planned today if new concerns arise.       Savion Washam G. Swaz[place], MD  [facility]. [place] office.

## 2017-10-02 ENCOUNTER — Encounter: Payer: Self-pay | Admitting: Family Medicine

## 2017-10-02 ENCOUNTER — Ambulatory Visit: Payer: 59 | Admitting: Family Medicine

## 2017-10-02 VITALS — BP 124/70 | HR 74 | Temp 98.3°F | Resp 12 | Ht 60.0 in | Wt 129.2 lb

## 2017-10-02 DIAGNOSIS — F411 Generalized anxiety disorder: Secondary | ICD-10-CM | POA: Diagnosis not present

## 2017-10-02 DIAGNOSIS — E559 Vitamin D deficiency, unspecified: Secondary | ICD-10-CM | POA: Diagnosis not present

## 2017-10-03 LAB — BASIC METABOLIC PANEL
BUN: 12 mg/dL (ref 6–23)
CALCIUM: 10.4 mg/dL (ref 8.4–10.5)
CHLORIDE: 99 meq/L (ref 96–112)
CO2: 31 mEq/L (ref 19–32)
CREATININE: 0.84 mg/dL (ref 0.40–1.20)
GFR: 78.21 mL/min (ref >60.00)
Glucose, Bld: 87 mg/dL (ref 70–99)
Potassium: 4.1 mEq/L (ref 3.5–5.1)
Sodium: 136 mEq/L (ref 135–145)

## 2017-10-03 LAB — VITAMIN D 25 HYDROXY (VIT D DEFICIENCY, FRACTURES): VITD: 28.53 ng/mL — AB (ref 30.00–100.00)

## 2017-10-04 MED ORDER — DULOXETINE HCL 60 MG PO CPEP: 60.0000 mg | ORAL_CAPSULE | Freq: Every day | ORAL | 2 refills | Status: DC

## 2017-10-05 ENCOUNTER — Encounter: Payer: Self-pay | Admitting: Family Medicine

## 2017-10-22 ENCOUNTER — Encounter: Payer: Self-pay | Admitting: Neurology

## 2017-10-22 ENCOUNTER — Other Ambulatory Visit: Payer: Self-pay

## 2017-10-22 DIAGNOSIS — G47411 Narcolepsy with cataplexy: Secondary | ICD-10-CM

## 2017-10-22 MED ORDER — AMPHETAMINE-DEXTROAMPHETAMINE 30 MG PO TABS: 30.0000 mg | ORAL_TABLET | Freq: Two times a day (BID) | ORAL | 0 refills | Status: DC

## 2017-10-22 MED ORDER — AMPHETAMINE-DEXTROAMPHETAMINE 10 MG PO TABS: ORAL_TABLET | ORAL | 0 refills | Status: DC

## 2017-10-22 NOTE — Telephone Encounter (Signed)
Pt up to date on her her appts and is due for a refill on adderall. Printed and given to Dr. Epimenio FootSater for review and signature on behalf of Dr. Frances FurbishAthar.

## 2017-11-03 ENCOUNTER — Other Ambulatory Visit: Payer: Self-pay | Admitting: Family Medicine

## 2017-11-05 DIAGNOSIS — G47419 Narcolepsy without cataplexy: Secondary | ICD-10-CM | POA: Diagnosis not present

## 2017-11-05 DIAGNOSIS — Z87891 Personal history of nicotine dependence: Secondary | ICD-10-CM | POA: Diagnosis not present

## 2017-12-04 ENCOUNTER — Telehealth: Payer: Self-pay | Admitting: *Deleted

## 2017-12-04 NOTE — Telephone Encounter (Signed)
Refill request for Burpropion HCL XL 300 mg, Duloxetine 60 mg cap

## 2017-12-05 ENCOUNTER — Ambulatory Visit: Payer: 59 | Admitting: Neurology

## 2017-12-05 ENCOUNTER — Encounter: Payer: Self-pay | Admitting: Neurology

## 2017-12-05 DIAGNOSIS — G47411 Narcolepsy with cataplexy: Secondary | ICD-10-CM

## 2017-12-05 MED ORDER — AMPHETAMINE-DEXTROAMPHETAMINE 10 MG PO TABS: ORAL_TABLET | ORAL | 0 refills | Status: DC

## 2017-12-05 MED ORDER — AMPHETAMINE-DEXTROAMPHETAMINE 30 MG PO TABS: 30.0000 mg | ORAL_TABLET | Freq: Two times a day (BID) | ORAL | 0 refills | Status: DC

## 2017-12-05 NOTE — Progress Notes (Signed)
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 all her questions today and she was in agreement. I spent 30 minutes in total face-to-face time with the patient, more than 50% of which was spent in counseling and coordination of care, reviewing test results, reviewing medication and discussing or reviewing the diagnosis of narcolepsy, its prognosis and treatment options. Pertinent laboratory and imaging test results that were available during this visit with the patient were reviewed by me and considered in my medical decision making (see chart for details).

## 2017-12-05 NOTE — Patient Instructions (Addendum)
In preparation for your upcoming sleep testing, please keep your medication timing very stable. Remember, you will not be able to use her cell phone, tablet or computer, or TV or a book during your MWT. There will be 4 tests, 40 min long each during the day.   Keep good sleep hygiene, which means: Keep a regular sleep and wake schedule and make enough time for sleep (7 1/2 to 8 1/2 hours for the average adult), try not to exercise or have a meal within 2 hours of your bedtime, try to keep your bedroom conducive for sleep, that is, cool and dark, without light distractors such as an illuminated alarm clock, and refrain from watching TV right before sleep or in the middle of the night and do not keep the TV or radio on during the night. If a nightlight is used, have it away from the visual field. Also, try not to use or play on electronic devices at bedtime, such as your cell phone, tablet PC or laptop. If you like to read at bedtime on an electronic device, try to dim the background light as much as possible. Do not eat in the middle of the night. Keep pets away from the bedroom environment. For stress relief, try meditation, deep breathing exercises (there are many books and CDs available), a white noise machine or fan can help to diffuse other noise distractors, such as traffic noise. Do not drink alcohol before bedtime, as it can disturb sleep and cause middle of the night awakenings. Never mix alcohol and sedating medications! Avoid narcotic pain medication close to bedtime, as opioids/narcotics can suppress breathing drive and breathing effort.   Don't change your caffeine habits, and do not add or change any medication.

## 2017-12-07 ENCOUNTER — Telehealth: Payer: Self-pay | Admitting: *Deleted

## 2017-12-07 NOTE — Telephone Encounter (Signed)
Refill request for Bupropion HCL XL 300 MG tablet, take 1 tablet every day

## 2017-12-10 ENCOUNTER — Other Ambulatory Visit: Payer: Self-pay | Admitting: Family Medicine

## 2017-12-10 DIAGNOSIS — F411 Generalized anxiety disorder: Secondary | ICD-10-CM

## 2017-12-10 MED ORDER — BUPROPION HCL ER (XL) 300 MG PO TB24: 300.0000 mg | ORAL_TABLET | Freq: Every day | ORAL | 2 refills | Status: DC

## 2017-12-10 MED ORDER — DULOXETINE HCL 60 MG PO CPEP: 60.0000 mg | ORAL_CAPSULE | Freq: Every day | ORAL | 2 refills | Status: DC

## 2017-12-10 NOTE — Telephone Encounter (Signed)
Rx for Wellbutrin XL 300 mg was sent to her pharmacy. [facility]

## 2017-12-10 NOTE — Telephone Encounter (Signed)
Rx for Cymbalta 60 mg was sent to her pharmacy.  [facility]

## 2017-12-16 DIAGNOSIS — G47419 Narcolepsy without cataplexy: Secondary | ICD-10-CM | POA: Diagnosis not present

## 2017-12-16 DIAGNOSIS — G4731 Primary central sleep apnea: Secondary | ICD-10-CM | POA: Diagnosis not present

## 2018-01-08 ENCOUNTER — Other Ambulatory Visit: Payer: Self-pay

## 2018-01-08 ENCOUNTER — Encounter: Payer: Self-pay | Admitting: Neurology

## 2018-01-08 DIAGNOSIS — G47411 Narcolepsy with cataplexy: Secondary | ICD-10-CM

## 2018-01-08 MED ORDER — AMPHETAMINE-DEXTROAMPHETAMINE 10 MG PO TABS: ORAL_TABLET | ORAL | 0 refills | Status: DC

## 2018-01-08 MED ORDER — AMPHETAMINE-DEXTROAMPHETAMINE 30 MG PO TABS: 30.0000 mg | ORAL_TABLET | Freq: Two times a day (BID) | ORAL | 0 refills | Status: DC

## 2018-01-21 DIAGNOSIS — G47419 Narcolepsy without cataplexy: Secondary | ICD-10-CM | POA: Diagnosis not present

## 2018-01-21 DIAGNOSIS — Z87891 Personal history of nicotine dependence: Secondary | ICD-10-CM | POA: Diagnosis not present

## 2018-01-21 DIAGNOSIS — G4731 Primary central sleep apnea: Secondary | ICD-10-CM | POA: Diagnosis not present

## 2018-01-28 ENCOUNTER — Encounter: Payer: Self-pay | Admitting: Neurology

## 2018-01-31 DIAGNOSIS — G4731 Primary central sleep apnea: Secondary | ICD-10-CM | POA: Diagnosis not present

## 2018-01-31 DIAGNOSIS — G4733 Obstructive sleep apnea (adult) (pediatric): Secondary | ICD-10-CM | POA: Diagnosis not present

## 2018-02-07 ENCOUNTER — Other Ambulatory Visit: Payer: Self-pay

## 2018-02-07 ENCOUNTER — Encounter: Payer: Self-pay | Admitting: Neurology

## 2018-02-07 DIAGNOSIS — G47411 Narcolepsy with cataplexy: Secondary | ICD-10-CM

## 2018-02-07 MED ORDER — AMPHETAMINE-DEXTROAMPHETAMINE 10 MG PO TABS: ORAL_TABLET | ORAL | 0 refills | Status: DC

## 2018-02-07 MED ORDER — AMPHETAMINE-DEXTROAMPHETAMINE 30 MG PO TABS: 30.0000 mg | ORAL_TABLET | Freq: Two times a day (BID) | ORAL | 0 refills | Status: DC

## 2018-02-21 DIAGNOSIS — G47419 Narcolepsy without cataplexy: Secondary | ICD-10-CM | POA: Diagnosis not present

## 2018-02-26 ENCOUNTER — Encounter: Payer: Self-pay | Admitting: Family Medicine

## 2018-03-02 ENCOUNTER — Other Ambulatory Visit: Payer: Self-pay | Admitting: Family Medicine

## 2018-03-02 DIAGNOSIS — F411 Generalized anxiety disorder: Secondary | ICD-10-CM

## 2018-03-02 MED ORDER — DULOXETINE HCL 60 MG PO CPEP: 60.0000 mg | ORAL_CAPSULE | Freq: Every day | ORAL | 2 refills | Status: DC

## 2018-03-02 MED ORDER — BUPROPION HCL ER (XL) 300 MG PO TB24: 300.0000 mg | ORAL_TABLET | Freq: Every day | ORAL | 2 refills | Status: DC

## 2018-03-11 ENCOUNTER — Encounter: Payer: Self-pay | Admitting: Neurology

## 2018-03-11 ENCOUNTER — Other Ambulatory Visit: Payer: Self-pay

## 2018-03-11 DIAGNOSIS — G47411 Narcolepsy with cataplexy: Secondary | ICD-10-CM

## 2018-03-11 MED ORDER — AMPHETAMINE-DEXTROAMPHETAMINE 30 MG PO TABS: 30.0000 mg | ORAL_TABLET | Freq: Two times a day (BID) | ORAL | 0 refills | Status: DC

## 2018-03-11 MED ORDER — AMPHETAMINE-DEXTROAMPHETAMINE 10 MG PO TABS: ORAL_TABLET | ORAL | 0 refills | Status: DC

## 2018-03-11 NOTE — Telephone Encounter (Signed)
Pt is due for a refill on her adderall RXs.  Drug Registry checked. 

## 2018-04-15 ENCOUNTER — Encounter: Payer: Self-pay | Admitting: Neurology

## 2018-04-16 ENCOUNTER — Other Ambulatory Visit: Payer: Self-pay

## 2018-04-16 DIAGNOSIS — G47411 Narcolepsy with cataplexy: Secondary | ICD-10-CM

## 2018-04-16 MED ORDER — AMPHETAMINE-DEXTROAMPHETAMINE 30 MG PO TABS: 30.0000 mg | ORAL_TABLET | Freq: Two times a day (BID) | ORAL | 0 refills | Status: DC

## 2018-04-16 MED ORDER — AMPHETAMINE-DEXTROAMPHETAMINE 10 MG PO TABS: ORAL_TABLET | ORAL | 0 refills | Status: DC

## 2018-04-16 NOTE — Telephone Encounter (Signed)
[place] [facility] chedk. Pt is up to date on her appts and is due for a refill on her adderalls.

## 2018-04-18 ENCOUNTER — Encounter: Payer: Self-pay | Admitting: Family Medicine

## 2018-05-27 ENCOUNTER — Encounter: Payer: Self-pay | Admitting: Neurology

## 2018-05-27 ENCOUNTER — Other Ambulatory Visit: Payer: Self-pay

## 2018-05-27 DIAGNOSIS — G47411 Narcolepsy with cataplexy: Secondary | ICD-10-CM

## 2018-05-27 MED ORDER — AMPHETAMINE-DEXTROAMPHETAMINE 30 MG PO TABS: 30.0000 mg | ORAL_TABLET | Freq: Two times a day (BID) | ORAL | 0 refills | Status: DC

## 2018-05-27 MED ORDER — AMPHETAMINE-DEXTROAMPHETAMINE 10 MG PO TABS: ORAL_TABLET | ORAL | 0 refills | Status: DC

## 2018-05-27 NOTE — Telephone Encounter (Signed)
[place] [facility] checked. Pt is due for a refill and is up to date on her appts. Will send adderall RX request to Dr. Frances Furbish for review and signature.

## 2018-06-12 ENCOUNTER — Ambulatory Visit: Payer: 59 | Admitting: Neurology

## 2018-06-12 ENCOUNTER — Encounter: Payer: Self-pay | Admitting: Neurology

## 2018-06-12 VITALS — BP 104/64 | HR 80 | Ht 60.0 in | Wt 131.0 lb

## 2018-06-12 DIAGNOSIS — G47411 Narcolepsy with cataplexy: Secondary | ICD-10-CM | POA: Diagnosis not present

## 2018-06-12 NOTE — Patient Instructions (Signed)
We will keep your meds the same. I am glad you are doing well, see you in 6 months.
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
## 2018-07-01 ENCOUNTER — Other Ambulatory Visit: Payer: Self-pay

## 2018-07-01 ENCOUNTER — Encounter: Payer: Self-pay | Admitting: Neurology

## 2018-07-01 DIAGNOSIS — G47411 Narcolepsy with cataplexy: Secondary | ICD-10-CM

## 2018-07-01 MED ORDER — AMPHETAMINE-DEXTROAMPHETAMINE 30 MG PO TABS: 30.0000 mg | ORAL_TABLET | Freq: Two times a day (BID) | ORAL | 0 refills | Status: DC

## 2018-07-01 MED ORDER — AMPHETAMINE-DEXTROAMPHETAMINE 10 MG PO TABS: ORAL_TABLET | ORAL | 0 refills | Status: DC

## 2018-07-01 NOTE — Telephone Encounter (Signed)
Pt is due for her adderall refills and up to date on her appts. [place] [facility] Registry checked.

## 2018-07-03 DIAGNOSIS — Z124 Encounter for screening for malignant neoplasm of cervix: Secondary | ICD-10-CM | POA: Diagnosis not present

## 2018-07-03 DIAGNOSIS — Z6825 Body mass index (BMI) 25.0-25.9, adult: Secondary | ICD-10-CM | POA: Diagnosis not present

## 2018-07-03 DIAGNOSIS — Z1231 Encounter for screening mammogram for malignant neoplasm of breast: Secondary | ICD-10-CM | POA: Diagnosis not present

## 2018-07-03 DIAGNOSIS — Z01419 Encounter for gynecological examination (general) (routine) without abnormal findings: Secondary | ICD-10-CM | POA: Diagnosis not present

## 2018-07-14 DIAGNOSIS — Z0100 Encounter for examination of eyes and vision without abnormal findings: Secondary | ICD-10-CM | POA: Diagnosis not present

## 2018-07-23 ENCOUNTER — Other Ambulatory Visit: Payer: Self-pay

## 2018-07-23 MED ORDER — SODIUM OXYBATE 500 MG/ML PO SOLN: ORAL | 5 refills | Status: DC

## 2018-08-02 ENCOUNTER — Telehealth: Payer: Self-pay | Admitting: Neurology

## 2018-08-02 ENCOUNTER — Encounter: Payer: Self-pay | Admitting: Neurology

## 2018-08-02 DIAGNOSIS — G47411 Narcolepsy with cataplexy: Secondary | ICD-10-CM

## 2018-08-02 MED ORDER — AMPHETAMINE-DEXTROAMPHETAMINE 30 MG PO TABS: 30.0000 mg | ORAL_TABLET | Freq: Two times a day (BID) | ORAL | 0 refills | Status: DC

## 2018-08-02 MED ORDER — AMPHETAMINE-DEXTROAMPHETAMINE 10 MG PO TABS: ORAL_TABLET | ORAL | 0 refills | Status: DC

## 2018-08-02 NOTE — Telephone Encounter (Signed)
  Patient is requesting a refill of Adderal 10 mg and 30 mg. Drug registry verified.  Adderal 10 mg 1 tab daily was last refilled on 07/01/2018 #30 for a 30 day supply.   Adderal 30 mg 2 tabs daily was last refilled on 07/01/2018 #60 for a 30 day supply.     Last o/v with Dr. Frances FurbishAthar was 05/16/2018 and next o/v is 12/12/2018. Please refill is appropriate.     Aundra MilletMegan, RN  ----- Message -----  From: Leata MouseHeather M Dark  Sent: 08/02/2018  9:00 AM EST  To: [facility]  Subject: Non-Urgent Medical Question             Felton ClintonMerry Christmas!     I need both of my scripts called in please...    Ty so much and I hope your holidays are awesome!!     Brantleigh   Refilled her adderall, 30 mg x60, 10mg  x30.

## 2018-08-02 NOTE — Telephone Encounter (Signed)
Dr. Terrace ArabiaYan r/f both doses of Adderall/fim

## 2018-09-12 ENCOUNTER — Encounter: Payer: Self-pay | Admitting: Neurology

## 2018-09-13 ENCOUNTER — Other Ambulatory Visit: Payer: Self-pay

## 2018-09-13 DIAGNOSIS — G47411 Narcolepsy with cataplexy: Secondary | ICD-10-CM

## 2018-09-13 MED ORDER — AMPHETAMINE-DEXTROAMPHETAMINE 10 MG PO TABS: ORAL_TABLET | ORAL | 0 refills | Status: DC

## 2018-09-13 MED ORDER — AMPHETAMINE-DEXTROAMPHETAMINE 30 MG PO TABS: 30.0000 mg | ORAL_TABLET | Freq: Two times a day (BID) | ORAL | 0 refills | Status: DC

## 2018-09-13 NOTE — Telephone Encounter (Signed)
Pt is requesting refills of 10 mg and 30 mg Adderall.  [place] drug registry verified. Last refill for 10 mg was written on 08/02/18 # 30 for a 30 day supply provided by Dr. Terrace Arabia. Last refill for 30 mg was written on 08/02/18 # 60 for a 30 day supply provided by Dr. Terrace Arabia.   Will send to work in provider due to Dr. Teofilo Pod absence.

## 2018-10-20 ENCOUNTER — Encounter: Payer: Self-pay | Admitting: Neurology

## 2018-10-21 ENCOUNTER — Encounter: Payer: Self-pay | Admitting: Family Medicine

## 2018-10-21 ENCOUNTER — Other Ambulatory Visit: Payer: Self-pay

## 2018-10-21 ENCOUNTER — Other Ambulatory Visit: Payer: Self-pay | Admitting: Family Medicine

## 2018-10-21 DIAGNOSIS — G47411 Narcolepsy with cataplexy: Secondary | ICD-10-CM

## 2018-10-21 DIAGNOSIS — F411 Generalized anxiety disorder: Secondary | ICD-10-CM

## 2018-10-21 MED ORDER — AMPHETAMINE-DEXTROAMPHETAMINE 10 MG PO TABS: ORAL_TABLET | ORAL | 0 refills | Status: DC

## 2018-10-21 MED ORDER — AMPHETAMINE-DEXTROAMPHETAMINE 30 MG PO TABS: 30.0000 mg | ORAL_TABLET | Freq: Two times a day (BID) | ORAL | 0 refills | Status: DC

## 2018-10-21 NOTE — Telephone Encounter (Signed)
Pt is up to date on her appts and is due for refills on her adderall. [place] [facility] Registry checked.

## 2018-11-05 ENCOUNTER — Encounter: Payer: Self-pay | Admitting: Family Medicine

## 2018-11-06 ENCOUNTER — Encounter: Payer: Self-pay | Admitting: Family Medicine

## 2018-11-06 ENCOUNTER — Telehealth: Payer: Self-pay | Admitting: *Deleted

## 2018-11-06 ENCOUNTER — Other Ambulatory Visit: Payer: Self-pay

## 2018-11-06 ENCOUNTER — Ambulatory Visit (INDEPENDENT_AMBULATORY_CARE_PROVIDER_SITE_OTHER): Payer: 59 | Admitting: Family Medicine

## 2018-11-06 VITALS — Resp 12

## 2018-11-06 DIAGNOSIS — F341 Dysthymic disorder: Secondary | ICD-10-CM

## 2018-11-06 DIAGNOSIS — E559 Vitamin D deficiency, unspecified: Secondary | ICD-10-CM | POA: Diagnosis not present

## 2018-11-06 DIAGNOSIS — F411 Generalized anxiety disorder: Secondary | ICD-10-CM | POA: Diagnosis not present

## 2018-11-06 MED ORDER — DULOXETINE HCL 60 MG PO CPEP: 60.0000 mg | ORAL_CAPSULE | Freq: Every day | ORAL | 3 refills | Status: DC

## 2018-11-06 MED ORDER — BUPROPION HCL ER (XL) 300 MG PO TB24: 300.0000 mg | ORAL_TABLET | Freq: Every day | ORAL | 3 refills | Status: DC

## 2018-11-06 NOTE — Assessment & Plan Note (Signed)
Recommend starting vitamin D supplementation 2000 units daily. We will plan on arranging 25 [place] vitamin D and BMP labs when possible and after public health issues normalize.

## 2018-11-06 NOTE — Telephone Encounter (Signed)
  Copied from CRM [id number]. Topic: Appointment Scheduling - Scheduling Inquiry for Clinic >> Nov 05, 2018  4:16 PM Mickel Baas B, [place] wrote: Reason for CRM: Patient calling and states that she was speaking with someone through MyChart and would like to schedule a virtual visit for her medication follow up. Please advise. >> Nov 05, 2018  4:29 PM Cox, Armando Gang, CMA wrote: Pt scheduled w/Jordan and given instructions on video visit email

## 2018-11-06 NOTE — Assessment & Plan Note (Signed)
She reported problem as well controlled with current management. No changes in Cymbalta or Wellbutrin XL. Because she is following periodically with neurologist, Dr. Frances Furbish, and as far as problem continue pain is stable, I think it is appropriate to continue following annually.

## 2018-11-06 NOTE — Progress Notes (Signed)
Virtual Visit via Video Note  I connected with@ on 11/06/18 at  3:30 PM EDT by a video enabled telemedicine application and verified that I am speaking with the correct person using two identifiers.  Location patient: home Location provider:work or home office Persons participating in the virtual visit: patient, provider  I discussed the limitations of evaluation and management by telemedicine and the availability of in person appointments. The patient expressed understanding and agreed to proceed.   HPI: Ms Cynthia [name] he is a 48 year old female with history of narcolepsy with cataplexy, generalized anxiety, dysrhythmia disorder, vitamin D deficiency.  Last follow-up visit on 10/02/2017.  Anxiety and dysthymic disorder , currently on Wellbutrin XL 300 mg daily and Cymbalta 60 mg daily. She is tolerating medication well. Reporting symptoms as well controlled, denies depression or suicidal thoughts.  Vitamin D deficiency, she has not been taking vitamin D supplementation in months. Last 25 [place] vitamin D was done in 09/2017, it was low at 28.53 (19.43). She has not noted myalgias, muscle fasciculations, changes in bowel habits, numbness or tingling. Ca++ has been elevated in the past, 10.6. Last Ca++ 10.4 in 09/2017.   Lab Results  Component Value Date   CREATININE 0.84 10/02/2017   BUN 12 10/02/2017   NA 136 10/02/2017   K 4.1 10/02/2017   CL 99 10/02/2017   CO2 31 10/02/2017    Since her last visit she has follow with Dr. Frances FurbishAthar, last visit on 06/12/2018. Currently she is on Adderall and Xyrem.   ROS: See pertinent positives and negatives per HPI.  Past Medical History:  Diagnosis Date  . AC separation 09/2014   left shoulder  . Anxiety   . Carpal tunnel syndrome of right wrist   . Complication of anesthesia   . Dental crowns present   . GERD (gastroesophageal reflux disease)    Gaviscon as needed  . Migraines   . Narcolepsy   . PONV (postoperative nausea and vomiting)    nausea    Past Surgical History:  Procedure Laterality Date  . ACROMIO-CLAVICULAR JOINT REPAIR Left 10/12/2014   Procedure: Left shoulder acromio-clavicular joint repair;  Surgeon: Mable ParisJustin William Chandler, MD;  Location: [facility];  Service: Orthopedics;  Laterality: Left;  Left shoulder acromio-clavicular joint repair  . BUNIONECTOMY Bilateral   . CESAREAN SECTION  10/14/2005; 08/23/2007  . CYSTOSCOPY  07/26/2009  . ENDOMETRIAL ABLATION  11/14/2002  . EUS N/A 01/22/2014   Procedure: UPPER ENDOSCOPIC ULTRASOUND (EUS) LINEAR;  Surgeon: Rachael Feeaniel P Jacobs, MD;  Location: [facility];  Service: Endoscopy;  Laterality: N/A;  . LAPAROSCOPIC LYSIS OF ADHESIONS  04/30/2009  . LAPAROSCOPIC OVARIAN CYSTECTOMY Left 11/14/2002  . LAPAROSCOPIC SUPRACERVICAL HYSTERECTOMY  07/26/2009  . LAPAROSCOPIC UNILATERAL SALPINGO OOPHERECTOMY Right 07/26/2009  . TUBAL LIGATION  08/23/2007    Family History  Problem Relation Age of Onset  . Prostate cancer Maternal Grandfather   . Heart disease Paternal Grandfather   . Kidney disease Sister    Social History   Socioeconomic History  . Marital status: Married    Spouse name: Jorja Loaim  . Number of children: 2  . Years of education: Bachelors  . Highest education level: Not on file  Occupational History  . Occupation: Hospital doctorsocial worker    [employer] [employer]  Social Needs  . Financial resource strain: Not on file  . Food insecurity:    Worry: Not on file    Inability: Not on file  . Transportation needs:    Medical: Not  on file    Non-medical: Not on file  Tobacco Use  . Smoking status: Former Smoker    Packs/day: 0.00    Years: 0.00    Pack years: 0.00    Last attempt to quit: 08/13/2005    Years since quitting: 13.2  . Smokeless tobacco: Never Used  Substance and Sexual Activity  . Alcohol use: No    Alcohol/week: 0.0 standard drinks  . Drug use: No  . Sexual activity: Not on file  Lifestyle  . Physical activity:    Days per week:  Not on file    Minutes per session: Not on file  . Stress: Not on file  Relationships  . Social connections:    Talks on phone: Not on file    Gets together: Not on file    Attends religious service: Not on file    Active member of club or organization: Not on file    Attends meetings of clubs or organizations: Not on file    Relationship status: Not on file  . Intimate partner violence:    Fear of current or ex partner: Not on file    Emotionally abused: Not on file    Physically abused: Not on file    Forced sexual activity: Not on file  Other Topics Concern  . Not on file  Social History Narrative   Pt lives at home with husband Cynthia [name]) and two children   Pt is right handed    Education - Bachelors degree   Caffeine 1-2 cups a day      Current Outpatient Medications:  .  amphetamine-dextroamphetamine (ADDERALL) 10 MG tablet, Daily at 11 AM. Along with 30 mg bid, separate Rx, Disp: 30 tablet, Rfl: 0 .  amphetamine-dextroamphetamine (ADDERALL) 30 MG tablet, Take 1 tablet by mouth 2 (two) times daily with a meal., Disp: 60 tablet, Rfl: 0 .  buPROPion (WELLBUTRIN XL) 300 MG 24 hr tablet, Take 1 tablet (300 mg total) by mouth daily., Disp: 90 tablet, Rfl: 3 .  DULoxetine (CYMBALTA) 60 MG capsule, Take 1 capsule (60 mg total) by mouth daily., Disp: 90 capsule, Rfl: 3 .  Sodium Oxybate (XYREM) 500 MG/ML SOLN, Take 3.75gms for 1st nightly dose and 3.75 gms for 2nd nightly dose.  (Total dose 7.5grams nightly), Disp: 270 mL, Rfl: 5 .  triamcinolone (KENALOG) 0.025 % ointment, Apply 1 application topically 2 (two) times daily., Disp: 45 g, Rfl: 2  EXAM:  VITALS per patient if applicable:Resp 12   GENERAL: alert, oriented, appears well and in no acute distress  HEENT: atraumatic, conjunctiva clear, no obvious abnormalities on inspection of external nose and ears  NECK: normal movements of the head and neck  LUNGS: on inspection no signs of respiratory distress, breathing  rate appears normal, no obvious gross SOB, gasping or wheezing  CV: no obvious cyanosis  MS: moves all visible extremities without noticeable abnormality  PSYCH/NEURO: pleasant and cooperative, no obvious depression or anxiety, speech and thought processing grossly intact  ASSESSMENT AND PLAN:  Discussed the following assessment and plan:  Generalized anxiety disorder She reported problem as well controlled with current management. No changes in Cymbalta or Wellbutrin XL. Because she is following periodically with neurologist, Dr. Frances Furbish, and as far as problem continue pain is stable, I think it is appropriate to continue following annually.  Vitamin D deficiency Recommend starting vitamin D supplementation 2000 units daily. We will plan on arranging 25 [place] vitamin D and BMP labs when possible  and after public health issues normalize.  DYSTHYMIC DISORDER Problem is stable. No changes in current management.     I discussed the assessment and treatment plan with the patient. The patient was provided an opportunity to ask questions and all were answered. The patient agreed with the plan and demonstrated an understanding of the instructions.   The patient was advised to call back or seek an in-person evaluation if the symptoms worsen or if the condition fails to improve as anticipated.  Return in about 1 year (around 11/06/2019) for cpe and f/u.  I provided 17 minutes of non-face-to-face time during this encounter.   Betty [place]az[place], MD

## 2018-11-06 NOTE — Assessment & Plan Note (Signed)
Problem is stable. No changes in current management. 

## 2018-11-18 ENCOUNTER — Encounter: Payer: Self-pay | Admitting: Neurology

## 2018-11-19 ENCOUNTER — Other Ambulatory Visit: Payer: Self-pay

## 2018-11-19 DIAGNOSIS — G47411 Narcolepsy with cataplexy: Secondary | ICD-10-CM

## 2018-11-19 MED ORDER — AMPHETAMINE-DEXTROAMPHETAMINE 10 MG PO TABS: ORAL_TABLET | ORAL | 0 refills | Status: DC

## 2018-11-19 MED ORDER — AMPHETAMINE-DEXTROAMPHETAMINE 30 MG PO TABS: 30.0000 mg | ORAL_TABLET | Freq: Two times a day (BID) | ORAL | 0 refills | Status: DC

## 2018-11-19 NOTE — Telephone Encounter (Signed)
Pt is due for a refill on her adderall RXs. Pt is up to date on her appts. [place] [facility] Registry checked.

## 2018-11-21 ENCOUNTER — Telehealth: Payer: Self-pay

## 2018-11-21 NOTE — Telephone Encounter (Signed)
I called pt to discuss a sooner virtual visit with the pt. No answer, left a message asking her to call me back. Pt may keep her same appt date or a sooner one, but either way, it will need to be converted to a virtual visit. If pt calls back, please discuss this with her and obtain consent.

## 2018-11-26 NOTE — Telephone Encounter (Signed)
I called pt, discussed with her a virtual visit at her appt date and time. Pt is agreeable to a virtual visit.  Pt understands that although there may be some limitations with this type of visit, we will take all precautions to reduce any security or privacy concerns.  Pt understands that this will be treated like an in office visit and we will file with pt's insurance, and there may be a patient responsible charge related to this service.  Pt's email is [email or web address]. Pt understands that the [employer] webex software must be downloaded and operational on the device pt plans to use for the visit.  Pt's meds, allergies, and PMH were updated.

## 2018-12-12 ENCOUNTER — Ambulatory Visit (INDEPENDENT_AMBULATORY_CARE_PROVIDER_SITE_OTHER): Payer: 59 | Admitting: Neurology

## 2018-12-12 ENCOUNTER — Other Ambulatory Visit: Payer: Self-pay

## 2018-12-12 ENCOUNTER — Encounter: Payer: Self-pay | Admitting: Neurology

## 2018-12-12 DIAGNOSIS — G47411 Narcolepsy with cataplexy: Secondary | ICD-10-CM | POA: Diagnosis not present

## 2018-12-12 NOTE — Patient Instructions (Signed)
Given verbally, during today's virtual video-based encounter, with verbal feedback received.   
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
## 2018-12-26 ENCOUNTER — Encounter: Payer: Self-pay | Admitting: Neurology

## 2018-12-26 ENCOUNTER — Other Ambulatory Visit: Payer: Self-pay

## 2018-12-26 DIAGNOSIS — G47411 Narcolepsy with cataplexy: Secondary | ICD-10-CM

## 2018-12-26 MED ORDER — AMPHETAMINE-DEXTROAMPHETAMINE 30 MG PO TABS: 30.0000 mg | ORAL_TABLET | Freq: Two times a day (BID) | ORAL | 0 refills | Status: DC

## 2018-12-26 MED ORDER — AMPHETAMINE-DEXTROAMPHETAMINE 10 MG PO TABS: ORAL_TABLET | ORAL | 0 refills | Status: DC

## 2018-12-26 NOTE — Telephone Encounter (Signed)
Pt is up to date on her appts, is due for a refill on her adderall, and  Drug Registry was checked.

## 2019-02-05 ENCOUNTER — Encounter: Payer: Self-pay | Admitting: Neurology

## 2019-02-06 ENCOUNTER — Other Ambulatory Visit: Payer: Self-pay

## 2019-02-06 DIAGNOSIS — G47411 Narcolepsy with cataplexy: Secondary | ICD-10-CM

## 2019-02-06 MED ORDER — AMPHETAMINE-DEXTROAMPHETAMINE 10 MG PO TABS: ORAL_TABLET | ORAL | 0 refills | Status: DC

## 2019-02-06 MED ORDER — AMPHETAMINE-DEXTROAMPHETAMINE 30 MG PO TABS: 30.0000 mg | ORAL_TABLET | Freq: Two times a day (BID) | ORAL | 0 refills | Status: DC

## 2019-02-06 NOTE — Telephone Encounter (Signed)
Pt is due for a refill on adderall and is up to date on her appts. [place] Drug Registry checked.

## 2019-03-19 ENCOUNTER — Encounter: Payer: Self-pay | Admitting: Neurology

## 2019-03-20 ENCOUNTER — Other Ambulatory Visit: Payer: Self-pay

## 2019-03-20 DIAGNOSIS — G47411 Narcolepsy with cataplexy: Secondary | ICD-10-CM

## 2019-03-20 MED ORDER — AMPHETAMINE-DEXTROAMPHETAMINE 10 MG PO TABS: ORAL_TABLET | ORAL | 0 refills | Status: DC

## 2019-03-20 MED ORDER — AMPHETAMINE-DEXTROAMPHETAMINE 30 MG PO TABS: 30.0000 mg | ORAL_TABLET | Freq: Two times a day (BID) | ORAL | 0 refills | Status: DC

## 2019-03-20 NOTE — Telephone Encounter (Signed)
Pt is due for a refill and up to date on her appts. [place] Drug Registry checked.

## 2019-04-29 ENCOUNTER — Encounter: Payer: Self-pay | Admitting: Neurology

## 2019-04-29 ENCOUNTER — Other Ambulatory Visit: Payer: Self-pay

## 2019-04-29 DIAGNOSIS — G47411 Narcolepsy with cataplexy: Secondary | ICD-10-CM

## 2019-04-29 MED ORDER — AMPHETAMINE-DEXTROAMPHETAMINE 10 MG PO TABS: ORAL_TABLET | ORAL | 0 refills | Status: DC

## 2019-04-29 MED ORDER — AMPHETAMINE-DEXTROAMPHETAMINE 30 MG PO TABS: 30.0000 mg | ORAL_TABLET | Freq: Two times a day (BID) | ORAL | 0 refills | Status: DC

## 2019-04-29 NOTE — Telephone Encounter (Signed)
Pt is due for a refill on her adderall RXs and is up to date on her appts. [place] Drug Registry checked.

## 2019-06-05 ENCOUNTER — Other Ambulatory Visit: Payer: Self-pay

## 2019-06-05 ENCOUNTER — Encounter: Payer: Self-pay | Admitting: Neurology

## 2019-06-05 DIAGNOSIS — G47411 Narcolepsy with cataplexy: Secondary | ICD-10-CM

## 2019-06-05 MED ORDER — AMPHETAMINE-DEXTROAMPHETAMINE 10 MG PO TABS: ORAL_TABLET | ORAL | 0 refills | Status: DC

## 2019-06-05 MED ORDER — AMPHETAMINE-DEXTROAMPHETAMINE 30 MG PO TABS: 30.0000 mg | ORAL_TABLET | Freq: Two times a day (BID) | ORAL | 0 refills | Status: DC

## 2019-06-05 NOTE — Telephone Encounter (Signed)
Pt is due for a refill on adderall. Pt is up to date on her appts. [place] Controlled Substance Registry checked.

## 2019-06-09 ENCOUNTER — Other Ambulatory Visit: Payer: Self-pay

## 2019-06-09 MED ORDER — XYREM 500 MG/ML PO SOLN: ORAL | 5 refills | Status: DC

## 2019-06-09 NOTE — Telephone Encounter (Signed)
Xyrem RX form filled out and faxed to xyrem [facility] program. Received a receipt of confirmation.

## 2019-06-12 ENCOUNTER — Other Ambulatory Visit: Payer: Self-pay

## 2019-06-12 ENCOUNTER — Ambulatory Visit: Payer: 59 | Admitting: Neurology

## 2019-06-12 ENCOUNTER — Encounter: Payer: Self-pay | Admitting: Neurology

## 2019-06-12 VITALS — BP 100/70 | Temp 98.2°F | Ht 61.0 in | Wt 137.0 lb

## 2019-06-12 DIAGNOSIS — G47411 Narcolepsy with cataplexy: Secondary | ICD-10-CM | POA: Diagnosis not present

## 2019-06-12 NOTE — Progress Notes (Signed)
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 needed. She is good about emailing [place] as well. I answered all her questions today and she was in agreement. I spent 20 minutes in total face-to-face time with the patient, more than 50% of which was spent in counseling and coordination of care, reviewing test results, reviewing medication and discussing or reviewing the diagnosis of narcolepsy, its prognosis and treatment options. Pertinent laboratory and imaging test results that were available during this visit with the patient were reviewed by me and considered in my medical decision making (see chart for details).

## 2019-06-12 NOTE — Patient Instructions (Signed)
As discussed, we will continue with your current medication regimen including Xyrem twice a night, Adderall XR 30 mg twice daily and Adderall IR 10 mg once daily.  Your exam is stable, please make sure you get blood work done at least once if not twice a year through your primary care, you do have an appointment pending with your GYN and will get blood work at the time as you indicated.  If possible, have the blood work faxed to our office as well so I can make sure your kidney and liver function and blood cell counts continue to stay good.

## 2019-07-21 ENCOUNTER — Encounter: Payer: Self-pay | Admitting: Neurology

## 2019-07-21 ENCOUNTER — Other Ambulatory Visit: Payer: Self-pay

## 2019-07-21 DIAGNOSIS — G47411 Narcolepsy with cataplexy: Secondary | ICD-10-CM

## 2019-07-21 MED ORDER — AMPHETAMINE-DEXTROAMPHETAMINE 30 MG PO TABS: 30.0000 mg | ORAL_TABLET | Freq: Two times a day (BID) | ORAL | 0 refills | Status: DC

## 2019-07-21 MED ORDER — AMPHETAMINE-DEXTROAMPHETAMINE 10 MG PO TABS: ORAL_TABLET | ORAL | 0 refills | Status: DC

## 2019-07-21 NOTE — Telephone Encounter (Signed)
Pt is due for a refill on her adderall. [place] [facility] Registry checked. Pt is up to date on her appts.

## 2019-08-25 ENCOUNTER — Telehealth: Payer: Self-pay | Admitting: Neurology

## 2019-08-25 ENCOUNTER — Encounter: Payer: Self-pay | Admitting: Neurology

## 2019-08-25 NOTE — Telephone Encounter (Signed)
I would like to get her on a bridge prescription for Xyrem and complete the prior authorization as soon as possible, I can write a letter of appeal if needed.  There really is not any other alternative for Xyrem.  I do not know how she ran out of it. I really do not have any other option for her at this point.  Please advise patient to call the Xyrem assistance program from her end, and find out what I need to do at this point.

## 2019-08-25 NOTE — Telephone Encounter (Signed)
Noted, replied to the patient mychart message

## 2019-08-25 NOTE — Telephone Encounter (Signed)
PA completed for xyrem and sent through cover my meds/[facility]. [id number] May take up to 72 hrs before hearing a response.

## 2019-08-26 ENCOUNTER — Encounter: Payer: Self-pay | Admitting: Neurology

## 2019-08-26 NOTE — Telephone Encounter (Signed)
[facility] has denied the patient the medication. I have completed Appeal letter and will forward information and letter to the appeals team.

## 2019-08-31 ENCOUNTER — Encounter: Payer: Self-pay | Admitting: Neurology

## 2019-09-01 ENCOUNTER — Telehealth: Payer: Self-pay

## 2019-09-01 DIAGNOSIS — G47411 Narcolepsy with cataplexy: Secondary | ICD-10-CM

## 2019-09-01 MED ORDER — AMPHETAMINE-DEXTROAMPHETAMINE 10 MG PO TABS: ORAL_TABLET | ORAL | 0 refills | Status: DC

## 2019-09-01 MED ORDER — AMPHETAMINE-DEXTROAMPHETAMINE 30 MG PO TABS: 30.0000 mg | ORAL_TABLET | Freq: Two times a day (BID) | ORAL | 0 refills | Status: DC

## 2019-09-01 NOTE — Telephone Encounter (Signed)
Pt has requested refill on 10 and 30mg  Adderal.  [place] drug registry has been verified.  Last refill for the 10 mg was 07/22/2019 # 30 for a 30 day supply. Last refill for the 30 mg was 07/21/2019 # 60 for a 30 day supply.  Pt has been complaint with f/u's. Last prescribing physician was Dr. 14/02/2019.

## 2019-10-16 ENCOUNTER — Other Ambulatory Visit: Payer: Self-pay

## 2019-10-16 ENCOUNTER — Encounter: Payer: Self-pay | Admitting: Neurology

## 2019-10-16 DIAGNOSIS — G47411 Narcolepsy with cataplexy: Secondary | ICD-10-CM

## 2019-10-16 MED ORDER — AMPHETAMINE-DEXTROAMPHETAMINE 10 MG PO TABS: ORAL_TABLET | ORAL | 0 refills | Status: DC

## 2019-10-16 MED ORDER — AMPHETAMINE-DEXTROAMPHETAMINE 30 MG PO TABS: 30.0000 mg | ORAL_TABLET | Freq: Two times a day (BID) | ORAL | 0 refills | Status: DC

## 2019-10-16 NOTE — Telephone Encounter (Signed)
Pt is requesting refills on her adderall. Pt is up to date on her appts. Pt is due for refills.  [place] [facility] Registry checked. Adderall last filled on 09/01/19.

## 2019-11-02 ENCOUNTER — Other Ambulatory Visit: Payer: Self-pay | Admitting: Family Medicine

## 2019-11-02 DIAGNOSIS — F411 Generalized anxiety disorder: Secondary | ICD-10-CM

## 2019-11-10 ENCOUNTER — Other Ambulatory Visit: Payer: Self-pay

## 2019-11-10 MED ORDER — XYREM 500 MG/ML PO SOLN: ORAL | 5 refills | Status: DC

## 2019-11-11 ENCOUNTER — Telehealth: Payer: Self-pay

## 2019-11-11 NOTE — Telephone Encounter (Signed)
Faxed xyrem [facility] refill form to [facility]. Received a receipt of confirmation.

## 2019-11-11 NOTE — Telephone Encounter (Signed)
Received request for a PA for pt's xyrem. Completed via covermymeds. Key: [id number]. Sent to [facility]. Should have a determination in 1-3 business days.

## 2019-11-12 NOTE — Telephone Encounter (Signed)
Received a notification from [facility] stating the PA was cancelled due to the patient has coverage already through them until 08/26/2020. PA was approved originally 08/27/19-08/26/2020 [id number]

## 2019-11-17 NOTE — Telephone Encounter (Signed)
I called [facility]. They do not have a PA on file. They asked me to fax the letter from [facility] with the approval dates to them at [phone]. I have done this. Received a receipt of confirmation.

## 2019-11-17 NOTE — Telephone Encounter (Signed)
Phone rep checked office voicemail's,at 10:14 this morning [facility] left voicemail stating there is no PA on file on their end.  The [facility] to reference is [facility] [phone] Option 3 twice

## 2019-11-26 ENCOUNTER — Other Ambulatory Visit: Payer: Self-pay | Admitting: *Deleted

## 2019-11-26 ENCOUNTER — Encounter: Payer: Self-pay | Admitting: Neurology

## 2019-11-26 DIAGNOSIS — G47411 Narcolepsy with cataplexy: Secondary | ICD-10-CM

## 2019-11-26 MED ORDER — AMPHETAMINE-DEXTROAMPHETAMINE 30 MG PO TABS: 30.0000 mg | ORAL_TABLET | Freq: Two times a day (BID) | ORAL | 0 refills | Status: DC

## 2019-11-26 MED ORDER — AMPHETAMINE-DEXTROAMPHETAMINE 10 MG PO TABS: ORAL_TABLET | ORAL | 0 refills | Status: DC

## 2019-11-26 NOTE — Telephone Encounter (Signed)
Pt requested refill of both Adderall 10 mg and 30 mg prescriptions. Per [place] registry, Adderall 30 mg #60 and Adderall 10 mg #30 both filled last on 10/16/19 as a 30 day supply. Both prescribed by Dr. Frances Furbish. Pt has a pending appt on 12/11/19. Refills sent to Dr. Frances Furbish to e-scribe.

## 2019-11-26 NOTE — Telephone Encounter (Signed)
Per [place] registry, Adderall 30 mg #60 and Adderall 10 mg #30 both filled last on 10/16/19 and were 30 day supplies. Both prescribed by Dr. Frances Furbish. Pt has a pending appt on 12/11/19. Refills sent to Dr. Frances Furbish to e-scribe.

## 2019-12-04 ENCOUNTER — Other Ambulatory Visit: Payer: Self-pay | Admitting: Family Medicine

## 2019-12-04 DIAGNOSIS — F411 Generalized anxiety disorder: Secondary | ICD-10-CM

## 2019-12-04 DIAGNOSIS — F341 Dysthymic disorder: Secondary | ICD-10-CM

## 2019-12-09 ENCOUNTER — Other Ambulatory Visit: Payer: Self-pay

## 2019-12-10 ENCOUNTER — Ambulatory Visit (INDEPENDENT_AMBULATORY_CARE_PROVIDER_SITE_OTHER): Payer: 59 | Admitting: Family Medicine

## 2019-12-10 ENCOUNTER — Encounter: Payer: Self-pay | Admitting: Family Medicine

## 2019-12-10 VITALS — BP 110/66 | HR 92 | Temp 98.3°F | Resp 12 | Ht 61.0 in | Wt 136.2 lb

## 2019-12-10 DIAGNOSIS — E559 Vitamin D deficiency, unspecified: Secondary | ICD-10-CM | POA: Diagnosis not present

## 2019-12-10 DIAGNOSIS — F411 Generalized anxiety disorder: Secondary | ICD-10-CM | POA: Diagnosis not present

## 2019-12-10 DIAGNOSIS — Z6825 Body mass index (BMI) 25.0-25.9, adult: Secondary | ICD-10-CM

## 2019-12-10 DIAGNOSIS — F341 Dysthymic disorder: Secondary | ICD-10-CM | POA: Diagnosis not present

## 2019-12-10 DIAGNOSIS — G47411 Narcolepsy with cataplexy: Secondary | ICD-10-CM

## 2019-12-10 MED ORDER — BUPROPION HCL ER (XL) 300 MG PO TB24: 300.0000 mg | ORAL_TABLET | Freq: Every day | ORAL | 2 refills | Status: DC

## 2019-12-10 MED ORDER — DULOXETINE HCL 60 MG PO CPEP: 60.0000 mg | ORAL_CAPSULE | Freq: Every day | ORAL | 3 refills | Status: DC

## 2019-12-10 NOTE — Patient Instructions (Signed)
A few things to remember from today's visit:   Start taking vit D as recommended. No changes in your medications today. Because you see other providers frequently I think it is still appropriate to continue following annually ,before if needed.  Calories count,regular physical activity,and food diary may help with wt loss.  Please be sure medication list is accurate. If a new problem present, please set up appointment sooner than planned today.

## 2019-12-10 NOTE — Assessment & Plan Note (Signed)
Problem is adequately controlled. Continue Cymbalta 60 mg daily. Recommend considering psychotherapy during episodes of exacerbation.

## 2019-12-10 NOTE — Assessment & Plan Note (Signed)
She continues following with neurologist every 3 to 4 months.

## 2019-12-10 NOTE — Progress Notes (Signed)
HPI:   Cynthia [name] is a 49 y.o. female with history of anxiety, ADD, narcolepsy, and chronic headaches who is here today for 12 months follow up.   She was last seen on 11/06/2018. No new problems since her last visit. She is still following with Dr. Frances Furbish for narcolepsy, every 3 to 4 months.  Anxiety and dysthymic disorder: Currently she is on Cymbalta 60 mg daily and Wellbutrin XR 300 mg daily. She feels like medication is still helping with problems. Cymbalta has also helped some with headaches. She is not following with psychotherapist.  Negative for suicidal thoughts.  In general she feels like problems are stable.  She is also concerned about weight gain in the past year. She is not exercising regularly and has not been consistent with following a healthful diet.  She reports that she had her last gynecologic routine examination in 07/2019.  Labs were done, otherwise normal except for low vitamin D. She is not on vitamin D supplementation. She does not remember lab results.  She also completed COVID-19 vaccination.  Review of Systems  Constitutional: Positive for fatigue. Negative for activity change, appetite change and fever.  HENT: Negative for mouth sores, nosebleeds and sore throat.   Eyes: Negative for redness and visual disturbance.  Respiratory: Negative for cough, shortness of breath and wheezing.   Cardiovascular: Negative for chest pain, palpitations and leg swelling.  Gastrointestinal: Negative for abdominal pain, nausea and vomiting.       Negative for changes in bowel habits.  Genitourinary: Negative for decreased urine volume and hematuria.  Musculoskeletal: Negative for gait problem and myalgias.  Neurological: Negative for seizures, syncope, weakness, numbness and headaches.  Psychiatric/Behavioral: Negative for confusion and hallucinations.  Rest of ROS, see pertinent positives sand negatives in HPI  Current Outpatient  Medications on File Prior to Visit  Medication Sig Dispense Refill  . amphetamine-dextroamphetamine (ADDERALL) 10 MG tablet Daily at 11 AM. Along with 30 mg bid, separate Rx 30 tablet 0  . amphetamine-dextroamphetamine (ADDERALL) 30 MG tablet Take 1 tablet by mouth 2 (two) times daily with a meal. 60 tablet 0  . buPROPion (WELLBUTRIN XL) 300 MG 24 hr tablet TAKE 1 TABLET BY MOUTH EVERY DAY 90 tablet 1  . DULoxetine (CYMBALTA) 60 MG capsule Take 1 capsule (60 mg total) by mouth daily. 90 capsule 3  . influenza vaccine (FLUCELVAX QUADRIVALENT) 0.5 ML injection Flucelvax Quad 2020-2021 (PF) 60 mcg (15 mcg x 4)/0.5 mL IM syringe  PHARMACY ADMINISTERED    . Sodium Oxybate (XYREM) 500 MG/ML SOLN Take 3.75gms for 1st nightly dose and 3.75 gms for 2nd nightly dose.  (Total dose 7.5grams nightly) 270 mL 5   No current facility-administered medications on file prior to visit.     Past Medical History:  Diagnosis Date  . AC separation 09/2014   left shoulder  . Anxiety   . Carpal tunnel syndrome of right wrist   . Complication of anesthesia   . Dental crowns present   . GERD (gastroesophageal reflux disease)    Gaviscon as needed  . Migraines   . Narcolepsy   . PONV (postoperative nausea and vomiting)    nausea   Allergies  Allergen Reactions  . Meclizine Hcl Other (See Comments)    SEVERE HEARTBURN  . Morphine Itching  . Percocet [Oxycodone-Acetaminophen] Itching    Social History   Socioeconomic History  . Marital status: Married    Spouse  name: [name]  . Number of children: 2  . Years of education: Bachelors  . Highest education level: Not on file  Occupational History  . Occupation: Child psychotherapist    Employer: [employer]  Tobacco Use  . Smoking status: Former Smoker    Packs/day: 0.00    Years: 0.00    Pack years: 0.00    Quit date: 08/13/2005    Years since quitting: 14.3  . Smokeless tobacco: Never Used  Substance and Sexual Activity  . Alcohol use: No     Alcohol/week: 0.0 standard drinks  . Drug use: No  . Sexual activity: Not on file  Other Topics Concern  . Not on file  Social History Narrative   Pt lives at home with husband [name] and two children   Pt is right handed    Education - Bachelors degree   Caffeine 1-2 cups a day   Social Determinants of Corporate investment banker Strain:   . Difficulty of Paying Living Expenses:   Food Insecurity:   . Worried About Programme researcher, broadcasting/film/video in the Last Year:   . Barista in the Last Year:   Transportation Needs:   . Freight forwarder (Medical):   Marland Kitchen Lack of Transportation (Non-Medical):   Physical Activity:   . Days of Exercise per Week:   . Minutes of Exercise per Session:   Stress:   . Feeling of Stress :   Social Connections:   . Frequency of Communication with Friends and Family:   . Frequency of Social Gatherings with Friends and Family:   . Attends Religious Services:   . Active Member of Clubs or Organizations:   . Attends Banker Meetings:   Marland Kitchen Marital Status:     Vitals:   12/10/19 0700  BP: 110/66  Pulse: 92  Resp: 12  Temp: 98.3 F (36.8 C)  SpO2: 99%   Wt Readings from Last 3 Encounters:  12/10/19 136 lb 4 oz (61.8 kg)  06/12/19 137 lb (62.1 kg)  06/12/18 131 lb (59.4 kg)   Body mass index is 25.74 kg/m.  Physical Exam  Nursing note and vitals reviewed. Constitutional: She is oriented to person, place, and time. She appears well-developed. No distress.  HENT:  Head: Normocephalic and atraumatic.  Eyes: Conjunctivae are normal.  Cardiovascular: Normal rate and regular rhythm.  No murmur heard. Pulses:      Dorsalis pedis pulses are 2+ on the right side and 2+ on the left side.  Respiratory: Effort normal and breath sounds normal. No respiratory distress.  GI: Soft. She exhibits no mass. There is no hepatomegaly. There is no abdominal tenderness.  Musculoskeletal:        General: No edema.  Lymphadenopathy:     She has no cervical adenopathy.  Neurological: She is alert and oriented to person, place, and time. She has normal strength. No cranial nerve deficit. Gait normal.  Skin: Skin is warm. No rash noted. No erythema.  Psychiatric: She has a normal mood and affect.  Well groomed, good eye contact.   ASSESSMENT AND PLAN:  Ms. KEVEN SOUCY was seen today for 12 months follow-up.  No orders of the defined types were placed in this encounter.  Vitamin D deficiency Recommend vitamin D3 1000 to 2000 units daily. Because forgetting to take Vit D,she can take 7000 to 14,000 units weekly.  Generalized anxiety disorder Problem is adequately controlled. Continue Cymbalta 60 mg daily.  Recommend considering psychotherapy during episodes of exacerbation.  DYSTHYMIC DISORDER Problem is stable. Continue Wellbutrin XR 300 mg daily and Cymbalta 60 mg daily. Follow-up in 12 months, before if needed.  Narcolepsy She continues following with neurologist every 3 to 4 months.  Body mass index (BMI) of 25.0-25.9 in adult Last time she was very here in the office was in 09/2017, weight loss 129.4 Lb. Consistency with following a healthful diet and engaging in regular physical activity recommended. Food diary and calorie counting may also help.  She is going to call later today to let us know about dates of COVID 19 vaccination. Asked to sign a release form,so we can obtain results of labs done at her gyn's office.  Return in 1 year (on 12/09/2020) for Follow up..   Oluwademilade Mckiver G. [place], MD  [facility]. [place] [address] [place] office.  A few things to remember from today's visit:   Start taking vit D as recommended. No changes in your medications today. Because you see other providers frequently I think it is still appropriate to continue following annually ,before if needed.  Calories count,regular physical activity,and food diary may help with wt loss.  Please be sure medication list is  accurate. If a new problem present, please set up appointment sooner than planned today.

## 2019-12-10 NOTE — Assessment & Plan Note (Signed)
Recommend vitamin D3 1000 to 2000 units daily. Because forgetting to take Vit D,she can take 7000 to 14,000 units weekly.

## 2019-12-10 NOTE — Addendum Note (Signed)
Addended by: [place], Trustin Chapa G on: 12/10/2019 07:47 AM   Modules accepted: Orders

## 2019-12-10 NOTE — Assessment & Plan Note (Signed)
Last time she was very here in the office was in 09/2017, weight loss 129.4 Lb. Consistency with following a healthful diet and engaging in regular physical activity recommended. Food diary and calorie counting may also help.

## 2019-12-10 NOTE — Assessment & Plan Note (Signed)
Problem is stable. Continue Wellbutrin XR 300 mg daily and Cymbalta 60 mg daily. Follow-up in 12 months, before if needed.

## 2019-12-11 ENCOUNTER — Ambulatory Visit: Payer: 59 | Admitting: Neurology

## 2019-12-15 ENCOUNTER — Ambulatory Visit: Payer: 59 | Admitting: Neurology

## 2019-12-15 ENCOUNTER — Encounter: Payer: Self-pay | Admitting: Neurology

## 2019-12-15 ENCOUNTER — Other Ambulatory Visit: Payer: Self-pay

## 2019-12-15 VITALS — BP 102/68 | HR 85 | Temp 97.4°F | Ht 60.0 in | Wt 138.3 lb

## 2019-12-15 DIAGNOSIS — G47411 Narcolepsy with cataplexy: Secondary | ICD-10-CM

## 2019-12-15 NOTE — Patient Instructions (Addendum)
As discussed, with your dental surgery coming up later this month, please get the Xyrem the night before your surgery and skip taking the Adderall the day of your surgery, as you will be taking Valium the night before your surgery and in the morning of the day of your surgery.  Please let me know if you have any additional questions.  Please make sure that you are has been can drive you for the procedure and make sure you take enough rest before going back to work that Monday. Your prescriptions are up-to-date.  Please follow-up routinely in 6 months.

## 2019-12-15 NOTE — Progress Notes (Signed)
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 has not been a symptom in quite some time, thankfully.She has been on Xyrem with success, currently 3.75 g bid. Over time, she has tried Adderall XR, Adderall immediate release, Ritalin, Nuvigil and Provigil in the past. She is willing to continue with Xyrem, she is wondering about long-term consequences of taking Xyrem.  I explained to her that there are no obvious warnings about long-term use of Xyrem.  Of course, this medicine has not been around for decades on end, it will be something we will learn with time but for now there is no contraindication of staying on it longer term. We increased this in 12/17. Furthermore, we can consider increasing this in the futurefor a maxdose of9 g for the night, if needed.there is a low sodium formulation available as well.  She is strongly encouraged to take the Xyrem only in bed when ready to sleep and keep a set schedule.  She is discouraged from taking it in the living room and falling asleep on the couch as it can make her groggy when standing up and she is only safe to take it while in bed for both doses.  She demonstrated understanding.  For her upcoming dental procedure later this  month she is advised to skip the Xyrem dose at night when she is supposed to take Valium.  In addition, she is advised to skip her Adderall doses that day, she will also take Xyrem in the morning of her procedure.  She will have her husband with her who will drive her.  She is advised to call our office if she should have any other questions or concerns.  Her medication prescriptions are up-to-date. She has been able to tolerate her immediate release Adderalland the current regimen works wellfor her,30 mgbid+ 10 mg for a total of 70 mg daily.She has had interim sleep study testing and office visits at [facility] and was cleared fully for driving and using the county car.Thankfully, she has been able to work from home during this pandemic. Physical exam is stable. Medical historyinunchanged, mood stable. She is advised to follow-up in 6 months, sooner as needed. She is good about emailing [place] as well. I answered all her questions today and she was in agreement.

## 2019-12-31 ENCOUNTER — Encounter: Payer: Self-pay | Admitting: Neurology

## 2019-12-31 DIAGNOSIS — G47411 Narcolepsy with cataplexy: Secondary | ICD-10-CM

## 2019-12-31 MED ORDER — AMPHETAMINE-DEXTROAMPHETAMINE 30 MG PO TABS: 30.0000 mg | ORAL_TABLET | Freq: Two times a day (BID) | ORAL | 0 refills | Status: DC

## 2019-12-31 MED ORDER — AMPHETAMINE-DEXTROAMPHETAMINE 10 MG PO TABS: ORAL_TABLET | ORAL | 0 refills | Status: DC

## 2020-02-05 ENCOUNTER — Encounter: Payer: Self-pay | Admitting: Neurology

## 2020-02-05 DIAGNOSIS — G47411 Narcolepsy with cataplexy: Secondary | ICD-10-CM

## 2020-02-05 MED ORDER — AMPHETAMINE-DEXTROAMPHETAMINE 10 MG PO TABS: ORAL_TABLET | ORAL | 0 refills | Status: DC

## 2020-02-05 MED ORDER — AMPHETAMINE-DEXTROAMPHETAMINE 30 MG PO TABS: 30.0000 mg | ORAL_TABLET | Freq: Two times a day (BID) | ORAL | 0 refills | Status: DC

## 2020-03-11 ENCOUNTER — Encounter: Payer: Self-pay | Admitting: Neurology

## 2020-03-11 DIAGNOSIS — G47411 Narcolepsy with cataplexy: Secondary | ICD-10-CM

## 2020-03-15 MED ORDER — AMPHETAMINE-DEXTROAMPHETAMINE 10 MG PO TABS: ORAL_TABLET | ORAL | 0 refills | Status: DC

## 2020-03-15 MED ORDER — AMPHETAMINE-DEXTROAMPHETAMINE 30 MG PO TABS: 30.0000 mg | ORAL_TABLET | Freq: Two times a day (BID) | ORAL | 0 refills | Status: DC

## 2020-05-06 ENCOUNTER — Encounter: Payer: Self-pay | Admitting: Neurology

## 2020-05-06 ENCOUNTER — Other Ambulatory Visit: Payer: Self-pay | Admitting: *Deleted

## 2020-05-06 DIAGNOSIS — G47411 Narcolepsy with cataplexy: Secondary | ICD-10-CM

## 2020-05-06 MED ORDER — AMPHETAMINE-DEXTROAMPHETAMINE 10 MG PO TABS: ORAL_TABLET | ORAL | 0 refills | Status: DC

## 2020-05-06 MED ORDER — AMPHETAMINE-DEXTROAMPHETAMINE 30 MG PO TABS: 30.0000 mg | ORAL_TABLET | Freq: Two times a day (BID) | ORAL | 0 refills | Status: DC

## 2020-05-06 NOTE — Progress Notes (Signed)
Rx's for adderall renewed.

## 2020-06-17 ENCOUNTER — Ambulatory Visit: Payer: Self-pay | Admitting: Neurology

## 2020-06-23 ENCOUNTER — Encounter: Payer: Self-pay | Admitting: Neurology

## 2020-06-23 ENCOUNTER — Ambulatory Visit: Payer: 59 | Admitting: Neurology

## 2020-06-23 DIAGNOSIS — G47411 Narcolepsy with cataplexy: Secondary | ICD-10-CM | POA: Diagnosis not present

## 2020-06-23 MED ORDER — AMPHETAMINE-DEXTROAMPHETAMINE 30 MG PO TABS: 30.0000 mg | ORAL_TABLET | Freq: Two times a day (BID) | ORAL | 0 refills | Status: DC

## 2020-06-23 MED ORDER — AMPHETAMINE-DEXTROAMPHETAMINE 10 MG PO TABS: ORAL_TABLET | ORAL | 0 refills | Status: DC

## 2020-06-23 NOTE — Patient Instructions (Signed)
It was good to see you as always.  I hope, your gum surgery does well this time.  Your your call week coming up, I am not quite sure what would be the best way for you to take your medication.  We may have to wait and see how you do, how difficult your call load is and how sleepy you are in the middle of the night.  I would not recommend that you go completely without the Xyrem.  I suggest that you take your first dose of Xyrem only at bedtime and skip the second dose for the week that you are on call.  At least, you do not have to go into work that week.  For your next week on call in April, I suggest that we meet before for another appointment in late March and come up with a different plan for your Xyrem during your call week, if need be. We could do half doses twice a night for example. Do feel free to call us or email [place] through MyChart for an update after you call week so we can rethink our game plan.  Also, during your call week since you are not taking a full dose of Xyrem those nights, please be extra careful with driving even though you do not have to drive into work, driving the kids and doing errands, you should be extra mindful that you could be sleepier than usual.  Ultimately, if you are not able to function well enough to take call, I would be happy to write a letter of support for your work for you to be exempt from taking call even though it is only 3 times per year.

## 2020-06-23 NOTE — Progress Notes (Signed)
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 smooth pursuit is noted. Hearing is grossly intact. Face is symmetric with normal facial animation, but she has mild swelling around the lower lip and chin area, no redness, no pain.  Speech is clear without dysarthria.  No hypophonia.  No lip, neck or jaw tremor.  Oropharynx examination shows mild mouth dryness, we did not look at the gumline at the lower jaw because she is not supposed to pull at her lip.   Chest:Clear to auscultation without wheezing, rhonchi or crackles noted.  Heart:S1+S2+0, regular and normal without murmurs, rubs or gallops noted.   Abdomen:Soft, non-tender and non-distended with normal bowel sounds appreciated on auscultation.  Extremities:There is nopitting edema in the distal lower extremities bilaterally.  Skin: Warm and dry without trophic changes noted.Noobvioussigns of any recentskin picking.  Musculoskeletal: exam reveals no obvious joint deformities, tenderness or joint swelling or erythema.   Neurologically:  Mental status: The patient is awake, alert and oriented in all 4 spheres. Herimmediate and remote memory, attention, language skills and  fund of knowledge are appropriate. There is no evidence of aphasia, agnosia, apraxia or anomia. Speech is clear with normal prosody and enunciation. Thought process is linear. Mood is normaland affect is normal.  Cranial nerves II - XII are as described above under HEENT exam. Motor exam: Normal bulk, strength and tone is noted. There is no tremor. Romberg is not tested today because she had recent sedation and feels just a little off balance.   Fine motor skills and coordination:grosslyintact.  Cerebellar testing: No dysmetria or intention tremor.  Sensory exam: intact to light touch in the upper and lower extremities.  Gait, station and balance: Shestands easily. No veering to one  side is noted. No leaning to one side is noted. Posture is age-appropriate and stance is narrow based. Gait shows normalstride length and normalpace. No problems turning are noted.  Assessment and Plan:  In summary, Cynthia Standley Hicksis a very pleasant 49 year old female with an underlying medical history of anxiety, ADD and recurrent headaches, who presents for followup consultation of her narcolepsy with cataplexy.Cataplexy has not been a symptom in quite some time, thankfully.She has been on Xyrem with success, currently 3.75 g bid. Over time, she has tried Adderall XR, Adderall immediate release, Ritalin, Nuvigil and Provigil in the past.She is willing to continue with Xyrem, she is wondering about long-term consequences of taking Xyrem.  We previously discussed that there are no obvious label warnings regarding long-term use of Xyrem.  The patient has experienced receding gum lines and needed surgery again recently.  She has had good results with Xyrem. We increased this in 12/17. Furthermore, we can consider increasing this in the futurefor a maxdose of9 g for the night, if needed.there is a low sodium formulation available as well.   She has been taking it on a set schedule and has been reminded to take it  only when ready in bed.  She is currently on a total dose of 7.5 g nightly. She has been able to tolerate her immediate release Adderalland the current regimen works wellfor her,Adderall XR 30 mgbid, and IR 10 mg for a total of 70 mg daily.She has had interim sleep study testing and office visits at [facility] and was cleared fully for driving and using the county car. 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  She has to start taking call 3 weeks of the year.  Her first week is coming up.  We talked about possibilities for her Xyrem.  She is advised for this first week coming up to only take her first nightly dose and skip the second dose and see how it goes.  At least she can work from home that week.  If we need to modify her regimen for her call week further we could try something like half dose twice nightly.  Unfortunately, there is no set recommendation and it will be something we have to try out and see how it goes.  Ultimately, if she cannot function during her call nights well enough I recommend that she seek exemption from taking call, she would like to take call to share the burden with her coworkers.  She is advised to let me know how it goes.  She is advised to follow-up with me in the office before her second call week in April so we can discuss our game plan and we will know more after she has gone through her first call week.  She is advised to be very careful with her driving that week during the day as she will be on just half as much Xyrem as typical.  I answered all her questions today and renewed her Adderall prescriptions.  She is advised to follow-up in late March 2022.  She was in agreement with the plan.   I spent 30 minutes in total face-to-face time and in reviewing records during pre-charting, more than 50% of which was spent in counseling and coordination of care, reviewing test results, reviewing medications and treatment regimen and/or in discussing or reviewing the  diagnosis of narcolepsy, the prognosis and treatment options. Pertinent laboratory and imaging test results that were available during this visit with the  patient were reviewed by me and considered in my medical decision making (see chart for details).

## 2020-06-24 ENCOUNTER — Other Ambulatory Visit: Payer: Self-pay | Admitting: *Deleted

## 2020-06-24 DIAGNOSIS — G47411 Narcolepsy with cataplexy: Secondary | ICD-10-CM

## 2020-06-24 MED ORDER — AMPHETAMINE-DEXTROAMPHETAMINE 30 MG PO TABS: 30.0000 mg | ORAL_TABLET | Freq: Two times a day (BID) | ORAL | 0 refills | Status: DC

## 2020-06-24 MED ORDER — AMPHETAMINE-DEXTROAMPHETAMINE 10 MG PO TABS: ORAL_TABLET | ORAL | 0 refills | Status: DC

## 2020-07-27 ENCOUNTER — Telehealth: Payer: Self-pay

## 2020-07-27 NOTE — Telephone Encounter (Signed)
Received this notice from [facility]: "Request Reference Number: [id number]. XYREM SOL 500MG /ML is approved through 07/27/2021. Your patient may now fill this prescription and it will be covered."

## 2020-07-27 NOTE — Telephone Encounter (Signed)
Received a PA request for pt's xyrem. Completed via CMM and sent to [facility]. Key: [id number]. Should have determination in 3-5 business days.

## 2020-08-12 ENCOUNTER — Encounter: Payer: Self-pay | Admitting: Neurology

## 2020-08-16 ENCOUNTER — Other Ambulatory Visit: Payer: Self-pay

## 2020-08-16 DIAGNOSIS — G47411 Narcolepsy with cataplexy: Secondary | ICD-10-CM

## 2020-08-16 MED ORDER — AMPHETAMINE-DEXTROAMPHETAMINE 10 MG PO TABS: ORAL_TABLET | ORAL | 0 refills | Status: DC

## 2020-08-16 MED ORDER — AMPHETAMINE-DEXTROAMPHETAMINE 30 MG PO TABS
30.0000 mg | ORAL_TABLET | Freq: Two times a day (BID) | ORAL | 0 refills | Status: DC
Start: 2020-08-16 — End: 2020-10-04

## 2020-08-16 NOTE — Telephone Encounter (Signed)
Pt is up to date on her appts. Pt is due for a refill on adderall. [place] Controlled Substance Registry checked and is appropriate. 

## 2020-10-04 ENCOUNTER — Encounter: Payer: Self-pay | Admitting: Neurology

## 2020-10-04 DIAGNOSIS — G47411 Narcolepsy with cataplexy: Secondary | ICD-10-CM

## 2020-10-04 MED ORDER — AMPHETAMINE-DEXTROAMPHETAMINE 10 MG PO TABS: ORAL_TABLET | ORAL | 0 refills | Status: DC

## 2020-10-04 MED ORDER — AMPHETAMINE-DEXTROAMPHETAMINE 30 MG PO TABS: 30.0000 mg | ORAL_TABLET | Freq: Two times a day (BID) | ORAL | 0 refills | Status: DC

## 2020-11-09 ENCOUNTER — Encounter: Payer: Self-pay | Admitting: Family Medicine

## 2020-11-10 ENCOUNTER — Ambulatory Visit: Payer: 59 | Admitting: Neurology

## 2020-11-14 ENCOUNTER — Other Ambulatory Visit: Payer: Self-pay | Admitting: Family Medicine

## 2020-11-14 DIAGNOSIS — F341 Dysthymic disorder: Secondary | ICD-10-CM

## 2020-11-14 DIAGNOSIS — F411 Generalized anxiety disorder: Secondary | ICD-10-CM

## 2020-11-16 ENCOUNTER — Ambulatory Visit: Payer: 59 | Admitting: Neurology

## 2020-11-16 ENCOUNTER — Encounter: Payer: Self-pay | Admitting: Neurology

## 2020-11-16 DIAGNOSIS — G47411 Narcolepsy with cataplexy: Secondary | ICD-10-CM

## 2020-11-16 MED ORDER — AMPHETAMINE-DEXTROAMPHETAMINE 10 MG PO TABS: ORAL_TABLET | ORAL | 0 refills | Status: DC

## 2020-11-16 MED ORDER — AMPHETAMINE-DEXTROAMPHETAMINE 30 MG PO TABS
30.0000 mg | ORAL_TABLET | Freq: Two times a day (BID) | ORAL | 0 refills | Status: DC
Start: 2020-11-16 — End: 2020-12-30

## 2020-11-16 NOTE — Progress Notes (Signed)
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 Medications as of 11/16/2020  Medication Sig  . amphetamine-dextroamphetamine (ADDERALL) 10 MG tablet Daily at 11 AM. Along with 30 mg bid, separate Rx  . amphetamine-dextroamphetamine (ADDERALL) 30 MG tablet Take 1 tablet by mouth 2 (two) times  daily with a meal.  . buPROPion (WELLBUTRIN XL) 300 MG 24 hr tablet Take 1 tablet (300 mg total) by mouth daily.  . DULoxetine (CYMBALTA) 60 MG capsule TAKE 1 CAPSULE BY MOUTH EVERY DAY  . influenza vaccine (FLUCELVAX QUADRIVALENT) 0.5 ML injection Flucelvax Quad 2020-2021 (PF) 60 mcg (15 mcg x 4)/0.5 mL IM syringe  PHARMACY ADMINISTERED  . Sodium Oxybate (XYREM) 500 MG/ML SOLN Take 3.75gms for 1st nightly dose and 3.75 gms for 2nd nightly dose.  (Total dose 7.5grams nightly)  . [DISCONTINUED] amoxicillin (AMOXIL) 500 MG tablet Take 500 mg by mouth 3 (three) times daily. For dental procedure (Patient not taking: Reported on 11/16/2020)   No facility-administered encounter medications on file as of 11/16/2020.  :  Review of Systems:  Out of a complete 14 point review of systems, all are reviewed and negative with  the exception of these symptoms as listed below: Review of Systems  Neurological:       Here for f/u appointment. Pt reports she has been doing well. No issues/complaints to report at this time.      Objective:  Neurological Exam  Physical Exam Physical Examination:   Vitals:   11/16/20 1254  BP: 118/70  Pulse: 87  SpO2: 97%    General Examination: The patient is a very pleasant 50 y.o. female in no acute distress. She appears well-developed and well-nourished and well groomed.   HEENT:Normocephalic, atraumatic, pupils are equal, round and reactive to light. Extraocular tracking is good without limitation to gaze excursion or nystagmus noted. Normal smooth pursuit is noted. Hearing is grossly intact. Face is symmetric with normal facial animation.  Speech is clear without dysarthria.  No hypophonia.  No lip, neck or jaw tremor.  Tongue protrudes centrally and palate elevates symmetrically.  Chest:Clear to auscultation without wheezing, rhonchi or crackles noted.  Heart:S1+S2+0, regular and normal without murmurs, rubs or gallops noted.   Abdomen:Soft, non-tender and non-distended.  Extremities:There is nopitting edema in the distal lower extremities bilaterally.  Skin: Warm and dry without trophic changes noted.Noobvioussigns of any recentskin picking.  Musculoskeletal: exam reveals no obvious joint deformities, tenderness or joint swelling.   Neurologically:  Mental status: The patient is awake, alert and oriented in all 4 spheres. Herimmediate and remote memory, attention, language skills and fund of knowledge are appropriate. There is no evidence of aphasia, agnosia, apraxia or anomia. Speech is clear with normal prosody and enunciation. Thought process is linear. Mood is normaland affect is normal.  Cranial nerves II - XII are as described above under HEENT exam. Motor exam: Normal bulk, strength and tone is noted. There is no tremor.Romberg is not  tested today because she had recent sedation and feels just a little off balance.   Fine motor skills and coordination:grosslyintact.  Cerebellar testing: No dysmetria or intention tremor.  Sensory exam: intact to light touch in the upper and lower extremities.  Gait, station and balance: Shestands easily. No veering to one side is noted. No leaning to one side is noted. Posture is age-appropriate and stance is narrow based. Gait shows normalstride length and normalpace. No problems turning are noted.  Assessment and Plan:  In summary, Cynthia Morocho Hicksis a very pleasant 50 year old female with an underlying medical history of anxiety, ADD and recurrent headaches, who presents for followup consultation of Cynthia [name] narcolepsy with cataplexy.Cataplexy has not been a symptom in quite some time, thankfully.She has been on Xyrem with success, currently 3.75 g bid. Over time, she has tried Adderall  XR, Adderall immediate release, Ritalin, Nuvigil and Provigil in the past.She has done well with Xyrem.  She takes it only once a night when she is on call which is infrequently.  The patient has experienced receding gum lines and needed surgery for this, further surgery not scheduled yet but planned for the near future. She has had good results with Xyrem. We increased this in 12/17. Furthermore, we can consider increasing this in the futurefor a maxdose of9 g for the night, if needed.There is a low sodium formulation available as well.  She has been taking it on a set schedule and has been reminded to take it only when ready in bed.  She is currently on a total dose of 7.5 g nightly. She has been able to tolerate Cynthia [name] immediate release Adderalland the current regimen works wellfor Cynthia [name],Adderall XR 30 mgbid, and IR 10 mg for a total of 70 mg daily.She has had interim sleep study testing and office visits at [facility] and was cleared fully for driving and using the county car. 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  When she took a week of call, she only used Xyrem once a night, she is on call this week and is planning to use the Xyrem once a night only, 1 dose around midnight.  She is advised to continue with this approach.  She needed refills on Cynthia [name] medications which are provided today.  She is advised to follow-up routinely in this clinic in 1 year as she is doing well.  She is advised to call or email [place] through [place] with any interim questions or concerns.  I answered all Cynthia [name] questions today and she was in agreement.   I spent 20 minutes in total face-to-face time and in reviewing records during pre-charting, more than 50% of which was spent in counseling and coordination of care, reviewing test results, reviewing medications and treatment regimen and/or in discussing or reviewing the diagnosis of narcolepsy, the prognosis and treatment options. Pertinent laboratory and imaging test results that were available during this visit with the patient were reviewed by me and considered in my medical decision making (see chart for details).

## 2020-11-16 NOTE — Patient Instructions (Signed)
It was nice to see you again today, congratulations on your new home! I am glad to hear that things are going well with your medicines.  As discussed, for your call week, you will only take 1 dose of Xyrem rather than your 2 doses and take your Xyrem dose around midnight so you have ability to wake up if need be if middle of the night and do not feel groggy from it in the morning.  I have renewed your Adderall prescriptions.  Please follow-up routinely in 1 year.  Please call us or email [place] through MyChart in the interim for any questions or concerns you may have.

## 2020-11-17 ENCOUNTER — Telehealth: Payer: Self-pay

## 2020-11-17 NOTE — Telephone Encounter (Signed)
Refill for Xyrem has been sent to [facility] via fax. Fax # [phone].  Confirmation received.

## 2020-11-19 LAB — HM PAP SMEAR: HM Pap smear: NEGATIVE

## 2020-11-23 NOTE — Telephone Encounter (Signed)
Med listed faxed and confirmation received.

## 2020-11-23 NOTE — Telephone Encounter (Signed)
At 8:44 [facility] pharmacy left a vm asking for pt's med list to be faxed to their pharmacy for pt's Xyrem.  Their fax# is [phone] if there are questions the call back # is [phone] option 3 then option 4

## 2020-12-12 ENCOUNTER — Other Ambulatory Visit: Payer: Self-pay | Admitting: Family Medicine

## 2020-12-12 DIAGNOSIS — F341 Dysthymic disorder: Secondary | ICD-10-CM

## 2020-12-30 ENCOUNTER — Encounter: Payer: Self-pay | Admitting: Neurology

## 2020-12-30 DIAGNOSIS — G47411 Narcolepsy with cataplexy: Secondary | ICD-10-CM

## 2020-12-30 MED ORDER — AMPHETAMINE-DEXTROAMPHETAMINE 30 MG PO TABS: 30.0000 mg | ORAL_TABLET | Freq: Two times a day (BID) | ORAL | 0 refills | Status: DC

## 2020-12-30 MED ORDER — AMPHETAMINE-DEXTROAMPHETAMINE 10 MG PO TABS: ORAL_TABLET | ORAL | 0 refills | Status: DC

## 2021-02-21 ENCOUNTER — Encounter: Payer: Self-pay | Admitting: Neurology

## 2021-02-21 DIAGNOSIS — G47411 Narcolepsy with cataplexy: Secondary | ICD-10-CM

## 2021-02-21 MED ORDER — AMPHETAMINE-DEXTROAMPHETAMINE 30 MG PO TABS: 30.0000 mg | ORAL_TABLET | Freq: Two times a day (BID) | ORAL | 0 refills | Status: DC

## 2021-02-21 MED ORDER — AMPHETAMINE-DEXTROAMPHETAMINE 10 MG PO TABS: ORAL_TABLET | ORAL | 0 refills | Status: DC

## 2021-02-21 NOTE — Telephone Encounter (Signed)
Per [place] registry, last filled on 12/30/2020  Dextroamp-Amphetamin 30 Mg Tab #60/30  & 12/30/2020 Dextroamp-Amphetamin 10 Mg Tab #30/30   Pt last seen on 11/16/2020 and next appt is 11/16/2021. Will send Rx refill to Dr Lucia Gaskins (work-in MD) to e-scribe as Dr Frances Furbish is out of the office today.

## 2021-02-22 NOTE — Telephone Encounter (Signed)
Received a fax with a Xyrem update for patient.  Medication last shipped on 01/27/2021, next scheduled ship date is 02/25/2021.  Patient cost is $35 and PA expiration date is 07/27/2021.

## 2021-03-16 ENCOUNTER — Telehealth: Payer: Self-pay | Admitting: *Deleted

## 2021-03-16 NOTE — Telephone Encounter (Addendum)
Received fax pt status report/  Xyrem  [facility] [phone], fx [phone] optum rx CASE ID [id number] PA exp 07-27-2021.  Last ship 02-25-21 next ship date 03-27-21. Total nightly dosage 7.5 gms.

## 2021-04-01 ENCOUNTER — Other Ambulatory Visit: Payer: Self-pay | Admitting: *Deleted

## 2021-04-01 ENCOUNTER — Encounter: Payer: Self-pay | Admitting: Neurology

## 2021-04-01 DIAGNOSIS — G47411 Narcolepsy with cataplexy: Secondary | ICD-10-CM

## 2021-04-01 MED ORDER — AMPHETAMINE-DEXTROAMPHETAMINE 30 MG PO TABS: 30.0000 mg | ORAL_TABLET | Freq: Two times a day (BID) | ORAL | 0 refills | Status: DC

## 2021-04-01 MED ORDER — AMPHETAMINE-DEXTROAMPHETAMINE 10 MG PO TABS: ORAL_TABLET | ORAL | 0 refills | Status: DC

## 2021-04-11 ENCOUNTER — Other Ambulatory Visit: Payer: Self-pay | Admitting: Family Medicine

## 2021-04-11 DIAGNOSIS — F341 Dysthymic disorder: Secondary | ICD-10-CM

## 2021-04-20 ENCOUNTER — Other Ambulatory Visit: Payer: Self-pay | Admitting: *Deleted

## 2021-04-20 MED ORDER — XYREM 500 MG/ML PO SOLN: ORAL | 5 refills | Status: DC

## 2021-05-10 ENCOUNTER — Other Ambulatory Visit: Payer: Self-pay | Admitting: Family Medicine

## 2021-05-10 DIAGNOSIS — F341 Dysthymic disorder: Secondary | ICD-10-CM

## 2021-05-10 DIAGNOSIS — F411 Generalized anxiety disorder: Secondary | ICD-10-CM

## 2021-05-15 ENCOUNTER — Other Ambulatory Visit: Payer: Self-pay | Admitting: Family Medicine

## 2021-05-15 ENCOUNTER — Encounter: Payer: Self-pay | Admitting: Family Medicine

## 2021-05-15 ENCOUNTER — Other Ambulatory Visit: Payer: Self-pay | Admitting: Neurology

## 2021-05-15 ENCOUNTER — Encounter: Payer: Self-pay | Admitting: Neurology

## 2021-05-15 DIAGNOSIS — G47411 Narcolepsy with cataplexy: Secondary | ICD-10-CM

## 2021-05-15 DIAGNOSIS — F341 Dysthymic disorder: Secondary | ICD-10-CM

## 2021-05-16 ENCOUNTER — Other Ambulatory Visit: Payer: Self-pay | Admitting: Neurology

## 2021-05-16 ENCOUNTER — Other Ambulatory Visit: Payer: Self-pay | Admitting: *Deleted

## 2021-05-16 DIAGNOSIS — G47411 Narcolepsy with cataplexy: Secondary | ICD-10-CM

## 2021-05-16 MED ORDER — XYREM 500 MG/ML PO SOLN: ORAL | 5 refills | Status: DC

## 2021-05-16 MED ORDER — AMPHETAMINE-DEXTROAMPHETAMINE 30 MG PO TABS: 30.0000 mg | ORAL_TABLET | Freq: Two times a day (BID) | ORAL | 0 refills | Status: DC

## 2021-05-16 MED ORDER — BUPROPION HCL ER (XL) 300 MG PO TB24: 300.0000 mg | ORAL_TABLET | Freq: Every day | ORAL | 0 refills | Status: DC

## 2021-05-16 MED ORDER — AMPHETAMINE-DEXTROAMPHETAMINE 10 MG PO TABS: ORAL_TABLET | ORAL | 0 refills | Status: DC

## 2021-05-16 NOTE — Telephone Encounter (Signed)
[facility] Cynthia [name]) request refill for Sodium Oxybate (XYREM) 500 MG/ML SOLN at [facility]

## 2021-05-16 NOTE — Telephone Encounter (Signed)
Pt also sent message requesting adderall refills.

## 2021-05-16 NOTE — Telephone Encounter (Signed)
Xyrem last filled 04/27/21 per [place] registry.

## 2021-05-16 NOTE — Telephone Encounter (Signed)
Per [place] registry, last filled Xyrem on 04/27/2021 450 mL for 30 day supply. Last filled Dextroamp-Amphetamin 30 Mg Tab on 04/01/21 for 30 day supply and also filled Dextroamp-Amphetamin 10 Mg Tab on 04/01/21 for 30 day supply. Will send Rx refills for the Adderall to Dr Frances Furbish. Pt has pending appt on 11/16/21.

## 2021-05-24 ENCOUNTER — Telehealth: Payer: Self-pay | Admitting: Neurology

## 2021-05-24 NOTE — Telephone Encounter (Signed)
Luanna Cole with [facility] pharmacy called sttaing they are needing a new prescription for the Sodium Oxybate (XYREM) 500 MG/ML SOLN. Fax number [phone].

## 2021-05-24 NOTE — Telephone Encounter (Signed)
Xyrem prescription faxed to [facility] pharmacy @ [phone]. Received a receipt of confirmation.

## 2021-05-25 ENCOUNTER — Ambulatory Visit: Payer: 59 | Admitting: Family Medicine

## 2021-05-26 ENCOUNTER — Other Ambulatory Visit: Payer: Self-pay | Admitting: *Deleted

## 2021-05-26 MED ORDER — XYREM 500 MG/ML PO SOLN: ORAL | 5 refills | Status: DC

## 2021-05-26 NOTE — Telephone Encounter (Signed)
I called and did a verbal order as a faxed copy is not valid.    I placed on the medication list as 30 day supply  and take 3.75 gms at bedtime and 3.75gms 2.5-4 hours later (total 7.5gms total nightly dose) with 5 refills to Christina C. Pharmacist at [facility].

## 2021-05-26 NOTE — Telephone Encounter (Signed)
Colin Mulders from [facility]  pharmacy called re: the Rx for Xyrem .  She states it was not submitted on the correct form.  They are going to send the form over via fax.  Their contact # is [phone] opt 3 then opt 4 fax [phone]

## 2021-06-06 ENCOUNTER — Other Ambulatory Visit: Payer: Self-pay | Admitting: Family Medicine

## 2021-06-06 DIAGNOSIS — F411 Generalized anxiety disorder: Secondary | ICD-10-CM

## 2021-06-06 DIAGNOSIS — F341 Dysthymic disorder: Secondary | ICD-10-CM

## 2021-06-07 NOTE — Progress Notes (Signed)
HPI: Cynthia [name] is a 50 y.o. female, who is here today for chronic disease management.  Last seen on 12/09/20. Since her last visit she has followed with neurology, Dr Frances Furbish.  Generalized anxiety disorder and depression, currently she is on Cymbalta 60 mg daily and Wellbutrin XL 300 mg daily. She feels like medications are helping. Denies side effects.  Depression screen [facility] 2/9 06/08/2021 12/10/2019  Decreased Interest 0 0  Down, Depressed, Hopeless 0 0  PHQ - 2 Score 0 0  Altered sleeping 0 0  Tired, decreased energy 3 0  Change in appetite 0 0  Feeling bad or failure about yourself  0 0  Trouble concentrating 0 0  Moving slowly or fidgety/restless 0 -  Suicidal thoughts 0 0  PHQ-9 Score 3 0  Difficult doing work/chores Not difficult at all Not difficult at all  Some recent data might be hidden   GAD 7 : Generalized Anxiety Score 06/08/2021 12/10/2019  Nervous, Anxious, on Edge 0 1  Control/stop worrying 0 0  Worry too much - different things 0 1  Trouble relaxing 0 0  Restless 0 0  Easily annoyed or irritable 1 1  Afraid - awful might happen 0 0  Total GAD 7 Score 1 3  Anxiety Difficulty Not difficult at all Not difficult at all   Some constipation ,which she ascribed to dietary changes.She is going to the wt loss clinic and has lost wt. She is taking OTC supplements to help with problem. No associated abdominal pain,nausea,vomiting,or blood in stool.  Sleeping from 9 to 6 am. Skin picking disorder is "a lot of better." Upper chest superficial excoriations have healed.  Vitamin D deficiency: 29 [place] vitamin D was 28.5 in 09/2017. She is not on vit D supplementation.  She sees her gyn regularly and she usually has blood work.  Weeks of tender lesion on right second toe. Pain is exacerbated by wearing socks or tight shoes, rubbing toe against great toe. No hx of trauma. She has not tried OTC medications.  Review of Systems  Constitutional:  Positive for  fatigue. Negative for activity change, appetite change and fever.  HENT:  Negative for mouth sores, nosebleeds and sore throat.   Respiratory:  Negative for cough, shortness of breath and wheezing.   Cardiovascular:  Negative for chest pain, palpitations and leg swelling.  Neurological:  Negative for syncope, weakness and headaches.  Psychiatric/Behavioral:  Negative for confusion and hallucinations.   Rest of ROS see pertinent positives and negatives in HPI.  Current Outpatient Medications on File Prior to Visit  Medication Sig Dispense Refill   amphetamine-dextroamphetamine (ADDERALL) 10 MG tablet Daily at 11 AM. Along with 30 mg bid, separate Rx 30 tablet 0   amphetamine-dextroamphetamine (ADDERALL) 30 MG tablet Take 1 tablet by mouth 2 (two) times daily with a meal. 60 tablet 0   Sodium Oxybate (XYREM) 500 MG/ML SOLN Take 3.75gms for 1st nightly dose at bedtime and 3.75 gms for 2nd nightly dose 2.5-4 hours later.  (Total dose 7.5grams nightly) 450 mL 5   No current facility-administered medications on file prior to visit.   Past Medical History:  Diagnosis Date   AC separation 09/2014   left shoulder   Anxiety    Carpal tunnel syndrome of right wrist    Complication of anesthesia    Dental crowns present    GERD (gastroesophageal reflux disease)    Gaviscon as needed   Migraines    Narcolepsy  PONV (postoperative nausea and vomiting)    nausea   Allergies  Allergen Reactions   Meclizine Hcl Other (See Comments)    SEVERE HEARTBURN   Morphine Itching   Percocet [Oxycodone-Acetaminophen] Itching    Social History   Socioeconomic History   Marital status: Married    Spouse name: [name]   Number of children: 2   Years of education: Bachelors   Highest education level: Not on file  Occupational History   Occupation: Child psychotherapist    Employer: [employer]  Tobacco Use   Smoking status: Former    Packs/day: 0.00    Years: 0.00    Pack years: 0.00    Types:  Cigarettes    Quit date: 08/13/2005    Years since quitting: 15.8   Smokeless tobacco: Never  Substance and Sexual Activity   Alcohol use: No    Alcohol/week: 0.0 standard drinks   Drug use: No   Sexual activity: Not on file  Other Topics Concern   Not on file  Social History Narrative   Pt lives at home with husband [name] and two children   Pt is right handed    Education - Bachelors degree   Caffeine 1-2 cups a day   Social Determinants of Corporate investment banker Strain: Not on file  Food Insecurity: Not on file  Transportation Needs: Not on file  Physical Activity: Not on file  Stress: Not on file  Social Connections: Not on file   Vitals:   06/08/21 0737  BP: 110/70  Pulse: 88  Resp: 16  SpO2: 93%   Body mass index is 24.41 kg/m.  Physical Exam Vitals and nursing note reviewed.  Constitutional:      General: She is not in acute distress.    Appearance: She is well-developed.  HENT:     Head: Normocephalic and atraumatic.     Mouth/Throat:     Mouth: Mucous membranes are moist.     Pharynx: Oropharynx is clear.  Eyes:     Conjunctiva/sclera: Conjunctivae normal.  Cardiovascular:     Rate and Rhythm: Normal rate and regular rhythm.     Pulses:          Dorsalis pedis pulses are 2+ on the right side and 2+ on the left side.     Heart sounds: No murmur heard. Pulmonary:     Effort: Pulmonary effort is normal. No respiratory distress.     Breath sounds: Normal breath sounds.  Abdominal:     Palpations: Abdomen is soft. There is no hepatomegaly or mass.     Tenderness: There is no abdominal tenderness.  Musculoskeletal:       Feet:  Lymphadenopathy:     Cervical: No cervical adenopathy.  Skin:    General: Skin is warm.     Findings: No erythema or rash.  Neurological:     General: No focal deficit present.     Mental Status: She is alert and oriented to person, place, and time.     Cranial Nerves: No cranial nerve deficit.     Gait:  Gait normal.  Psychiatric:     Comments: Well groomed, good eye contact.   ASSESSMENT AND PLAN:  [name] was seen today for follow-up.  Diagnoses and all orders for this visit:  Callus between toes OTC callus remover on lesion and silicone toe separator may help. If problem gets worse, podiatrist evaluation may be necessary.  Need for influenza vaccination -  Flu Vaccine QUAD 21mo+IM (Fluarix, Fluzone & Alfiuria Quad PF)  Vitamin D deficiency Recommend starting OTC vit D 1000 U. Planning on having labs at her gyn's office during next visit.  Generalized anxiety disorder Problem is stable. Continue Duloxetine 60 mg daily. F/U in a year, before if needed.  DYSTHYMIC DISORDER Stable. Continue same dose of Wellbutrin XL and Duloxetine. Continue annual follow up as far as there are no changes.   Return in about 1 year (around 06/08/2022).  Jim Philemon G. [place], MD  [facility]. [place] office.

## 2021-06-08 ENCOUNTER — Encounter: Payer: Self-pay | Admitting: Family Medicine

## 2021-06-08 ENCOUNTER — Ambulatory Visit: Payer: 59 | Admitting: Family Medicine

## 2021-06-08 ENCOUNTER — Other Ambulatory Visit: Payer: Self-pay

## 2021-06-08 VITALS — BP 110/70 | HR 88 | Resp 16 | Ht 60.0 in | Wt 125.0 lb

## 2021-06-08 DIAGNOSIS — F341 Dysthymic disorder: Secondary | ICD-10-CM | POA: Diagnosis not present

## 2021-06-08 DIAGNOSIS — E559 Vitamin D deficiency, unspecified: Secondary | ICD-10-CM | POA: Diagnosis not present

## 2021-06-08 DIAGNOSIS — F411 Generalized anxiety disorder: Secondary | ICD-10-CM | POA: Diagnosis not present

## 2021-06-08 DIAGNOSIS — Z23 Encounter for immunization: Secondary | ICD-10-CM | POA: Diagnosis not present

## 2021-06-08 DIAGNOSIS — L84 Corns and callosities: Secondary | ICD-10-CM

## 2021-06-08 MED ORDER — DULOXETINE HCL 60 MG PO CPEP: ORAL_CAPSULE | ORAL | 3 refills | Status: DC

## 2021-06-08 MED ORDER — BUPROPION HCL ER (XL) 300 MG PO TB24: 300.0000 mg | ORAL_TABLET | Freq: Every day | ORAL | 3 refills | Status: DC

## 2021-06-08 NOTE — Assessment & Plan Note (Signed)
Problem is stable. Continue Duloxetine 60 mg daily. F/U in a year, before if needed.

## 2021-06-08 NOTE — Assessment & Plan Note (Addendum)
Recommend starting OTC vit D 1000 U. Planning on having labs at her gyn's office during next visit.

## 2021-06-08 NOTE — Patient Instructions (Addendum)
A few things to remember from today's visit:  Vitamin D deficiency  Dysthymic disorder - Plan: buPROPion (WELLBUTRIN XL) 300 MG 24 hr tablet, DULoxetine (CYMBALTA) 60 MG capsule  Generalized anxiety disorder - Plan: DULoxetine (CYMBALTA) 60 MG capsule  Need for influenza vaccination - Plan: Flu Vaccine QUAD 36mo+IM (Fluarix, Fluzone & Alfiuria Quad PF)  Callus between toes  Do not use My Chart to request refills or for acute issues that need immediate attention.   Please be sure medication list is accurate. If a new problem present, please set up appointment sooner than planned today. Start taking vit D 1000 U daily and be sure your gyn check it next visit.  Over the counter callus removal,usually salicylic acid on affected area, caution with healthy skin. Keep toes form rubbing. You can find devises ,soft material, to place in between toes.

## 2021-06-08 NOTE — Assessment & Plan Note (Signed)
Stable. Continue same dose of Wellbutrin XL and Duloxetine. Continue annual follow up as far as there are no changes.

## 2021-06-22 ENCOUNTER — Encounter: Payer: Self-pay | Admitting: Neurology

## 2021-06-22 DIAGNOSIS — G47411 Narcolepsy with cataplexy: Secondary | ICD-10-CM

## 2021-06-22 MED ORDER — AMPHETAMINE-DEXTROAMPHETAMINE 10 MG PO TABS: ORAL_TABLET | ORAL | 0 refills | Status: DC

## 2021-06-22 MED ORDER — AMPHETAMINE-DEXTROAMPHETAMINE 30 MG PO TABS: 30.0000 mg | ORAL_TABLET | Freq: Two times a day (BID) | ORAL | 0 refills | Status: DC

## 2021-06-22 NOTE — Telephone Encounter (Signed)
Per [place] registry, both Adderall 10 mg and 30 mg doses were refilled a one month supply on 05/16/2021. Refill requests sent to Dr Frances Furbish. Pt has a pending f/u on 11/16/2021.

## 2021-07-05 ENCOUNTER — Telehealth: Payer: Self-pay

## 2021-07-05 NOTE — Telephone Encounter (Signed)
I have submitted a PA request for Xyrem on CMM, Key: [id number].   Awaiting determination.

## 2021-07-06 NOTE — Telephone Encounter (Signed)
Xyrem approved.  Request Reference Number: [id number]. XYREM SOL 500MG /ML is approved through 07/05/2022.

## 2021-08-10 ENCOUNTER — Telehealth: Payer: Self-pay | Admitting: Family Medicine

## 2021-08-10 ENCOUNTER — Encounter: Payer: Self-pay | Admitting: Neurology

## 2021-08-10 DIAGNOSIS — G47411 Narcolepsy with cataplexy: Secondary | ICD-10-CM

## 2021-08-10 MED ORDER — AMPHETAMINE-DEXTROAMPHETAMINE 30 MG PO TABS: 30.0000 mg | ORAL_TABLET | Freq: Two times a day (BID) | ORAL | 0 refills | Status: DC

## 2021-08-10 MED ORDER — AMPHETAMINE-DEXTROAMPHETAMINE 10 MG PO TABS: ORAL_TABLET | ORAL | 0 refills | Status: DC

## 2021-08-10 NOTE — Telephone Encounter (Signed)
Per [place] registry, pt last filled Dextroamp-Amphetamin 30 Mg Tab #60/30 and Dextroamp-Amphetamin 10 Mg Tab #30/30 on 06/22/21. Will send Rx request to Dr Teresa Coombs as Dr Frances Furbish is out of the office at this time. Pt is up-to-date on her appts. Pending f/u 11/2021.

## 2021-08-10 NOTE — Telephone Encounter (Signed)
Patient calling in with respiratory symptoms: Shortness of breath, chest pain, palpitations or other red words send to Triage  Does the patient have a fever over 100, cough, congestion, sore throat, runny nose, lost of taste/smell (please list symptoms that patient has)?st,ha, chest congestion  What date did symptoms start?08-07-2021 (If over 5 days ago, pt may be scheduled for in person visit)  Have you tested for Covid in the last 5 days? No   If yes, was it positive []  OR negative [] ? If positive in the last 5 days, please schedule virtual visit now. If negative, schedule for an in person OV with the next available provider if PCP has no openings. Please also let patient know they will be tested again (follow the script below)  "you will have to arrive prior to your appt time to be Covid tested. Please park in back of office at the [facility] & call [phone] to let the staff know you have arrived. A staff member will meet you at your car to do a rapid covid test. Once the test has resulted you will be notified by phone of your results to determine if appt will remain an in person visit or be converted to a virtual/phone visit. If you arrive less than before your appt time, your visit will be automatically converted to virtual & any recommended testing will happen AFTER the visit." Pt has virtual with cory on 08-11-2021  THINGS TO REMEMBER  If no availability for virtual visit in office,  please schedule another [place] office  If no availability at another [place] office, please instruct patient that they can schedule an evisit or virtual visit through their mychart account. Visits up to 8pm  patients can be seen in office 5 days after positive COVID test

## 2021-08-11 ENCOUNTER — Encounter: Payer: Self-pay | Admitting: Adult Health

## 2021-08-11 ENCOUNTER — Telehealth (INDEPENDENT_AMBULATORY_CARE_PROVIDER_SITE_OTHER): Payer: 59 | Admitting: Adult Health

## 2021-08-11 VITALS — Ht 60.0 in | Wt 118.0 lb

## 2021-08-11 DIAGNOSIS — J988 Other specified respiratory disorders: Secondary | ICD-10-CM

## 2021-08-11 MED ORDER — PREDNISONE 20 MG PO TABS: 20.0000 mg | ORAL_TABLET | Freq: Every day | ORAL | 0 refills | Status: DC

## 2021-08-11 MED ORDER — AZITHROMYCIN 250 MG PO TABS: ORAL_TABLET | ORAL | 0 refills | Status: AC

## 2021-08-11 NOTE — Progress Notes (Signed)
Virtual Visit via Video Note  I connected with Sander Radon on 08/11/21 at  1:30 PM EST by a video enabled telemedicine application and verified that I am speaking with the correct person using two identifiers.  Location patient: home Location provider:work or home office Persons participating in the virtual visit: patient, provider  I discussed the limitations of evaluation and management by telemedicine and the availability of in person appointments. The patient expressed understanding and agreed to proceed.   HPI: 50 year old female who is being evaluated today for an acute issue.  Her symptoms started 4 days ago.  Symptoms include sore throat, productive cough, chest congestion, and headaches.  She also reports that her husband and 2 children had the same symptoms, fortunately they were able to get over their symptoms.  Reports that the worst is the sore throat that is present all the time but especially worse with coughing.  She has not noticed any white spots on the back of her throat.  Denies fevers or chills.  She has been using over-the-counter Tylenol/Motrin without much relief.   ROS: See pertinent positives and negatives per HPI.  Past Medical History:  Diagnosis Date   AC separation 09/2014   left shoulder   Anxiety    Carpal tunnel syndrome of right wrist    Complication of anesthesia    Dental crowns present    GERD (gastroesophageal reflux disease)    Gaviscon as needed   Migraines    Narcolepsy    PONV (postoperative nausea and vomiting)    nausea    Past Surgical History:  Procedure Laterality Date   ACROMIO-CLAVICULAR JOINT REPAIR Left 10/12/2014   Procedure: Left shoulder acromio-clavicular joint repair;  Surgeon: Mable Paris, MD;  Location: [facility];  Service: Orthopedics;  Laterality: Left;  Left shoulder acromio-clavicular joint repair   BUNIONECTOMY Bilateral    CESAREAN SECTION  10/14/2005; 08/23/2007   CYSTOSCOPY  07/26/2009    ENDOMETRIAL ABLATION  11/14/2002   EUS N/A 01/22/2014   Procedure: UPPER ENDOSCOPIC ULTRASOUND (EUS) LINEAR;  Surgeon: Rachael Fee, MD;  Location: [facility];  Service: Endoscopy;  Laterality: N/A;   LAPAROSCOPIC LYSIS OF ADHESIONS  04/30/2009   LAPAROSCOPIC OVARIAN CYSTECTOMY Left 11/14/2002   LAPAROSCOPIC SUPRACERVICAL HYSTERECTOMY  07/26/2009   LAPAROSCOPIC UNILATERAL SALPINGO OOPHERECTOMY Right 07/26/2009   TUBAL LIGATION  08/23/2007    Family History  Problem Relation Age of Onset   Prostate cancer Maternal Grandfather    Heart disease Paternal Grandfather    Kidney disease Sister        Current Outpatient Medications:    amphetamine-dextroamphetamine (ADDERALL) 10 MG tablet, Daily at 11 AM. Along with 30 mg bid, separate Rx, Disp: 30 tablet, Rfl: 0   amphetamine-dextroamphetamine (ADDERALL) 30 MG tablet, Take 1 tablet by mouth 2 (two) times daily with a meal., Disp: 60 tablet, Rfl: 0   buPROPion (WELLBUTRIN XL) 300 MG 24 hr tablet, Take 1 tablet (300 mg total) by mouth daily., Disp: 90 tablet, Rfl: 3   DULoxetine (CYMBALTA) 60 MG capsule, TAKE 1 CAPSULE BY MOUTH EVERY DAY, Disp: 90 capsule, Rfl: 3   Sodium Oxybate (XYREM) 500 MG/ML SOLN, Take 3.75gms for 1st nightly dose at bedtime and 3.75 gms for 2nd nightly dose 2.5-4 hours later.  (Total dose 7.5grams nightly), Disp: 450 mL, Rfl: 5  EXAM:  VITALS per patient if applicable:  GENERAL: alert, oriented, appears well and in no acute distress  HEENT: atraumatic, conjunttiva clear, no obvious abnormalities on inspection  of external nose and ears  NECK: normal movements of the head and neck  LUNGS: on inspection no signs of respiratory distress, breathing rate appears normal, no obvious gross SOB, gasping or wheezing  CV: no obvious cyanosis  MS: moves all visible extremities without noticeable abnormality  PSYCH/NEURO: pleasant and cooperative, no obvious depression or anxiety, speech and thought processing grossly  intact  ASSESSMENT AND PLAN:  Discussed the following assessment and plan:  1. Respiratory infection -Advised the patient that symptoms are likely a viral respiratory infection.  We will send in antibiotics going into a holiday weekend, she was advised to wait an additional 2 days before starting.  If symptoms are worse and then start the medication if they are improving then forego antibiotic therapy.  We will also send in 7-day course of prednisone to help with her symptoms. - azithromycin (ZITHROMAX) 250 MG tablet; Take 2 tablets on day 1, then 1 tablet daily on days 2 through 5  Dispense: 6 tablet; Refill: 0 - predniSONE (DELTASONE) 20 MG tablet; Take 1 tablet (20 mg total) by mouth daily with breakfast.  Dispense: 7 tablet; Refill: 0    I discussed the assessment and treatment plan with the patient. The patient was provided an opportunity to ask questions and all were answered. The patient agreed with the plan and demonstrated an understanding of the instructions.   The patient was advised to call back or seek an in-person evaluation if the symptoms worsen or if the condition fails to improve as anticipated.   Shirline Frees, NP

## 2021-08-16 ENCOUNTER — Telehealth: Payer: Self-pay | Admitting: *Deleted

## 2021-08-16 NOTE — Telephone Encounter (Signed)
Received fax pt status report/  Xyrem  [facility] [phone], fx [phone] optum rx CASE ID [id number] PA exp 07-05-2022.  Last ship 08-09-2021 next ship date 09-05-2021. Total nightly dosage 7.5 gm.

## 2021-09-22 ENCOUNTER — Encounter: Payer: Self-pay | Admitting: Neurology

## 2021-09-22 DIAGNOSIS — G47411 Narcolepsy with cataplexy: Secondary | ICD-10-CM

## 2021-09-22 MED ORDER — AMPHETAMINE-DEXTROAMPHETAMINE 30 MG PO TABS: 30.0000 mg | ORAL_TABLET | Freq: Two times a day (BID) | ORAL | 0 refills | Status: DC

## 2021-09-22 NOTE — Telephone Encounter (Signed)
Per [place] registry, last filled Dextroamp-Amphetamin 30 Mg Tab 60.00 and Dextroamp-Amphetamin 10 Mg Tab 30.00 on 08/10/21. I called [facility] in [place] and was told they do have the 30 mg in stock and the 15 mg but not the 10 mg. Will send Rx refill for the 30 mg to Dr Rexene Alberts. Pt's appt was 11/16/20 and her next one is 11/16/21.

## 2021-09-27 NOTE — Telephone Encounter (Signed)
Received fax pt status report/  Xyrem  [facility] [phone], fx [phone] optum rx CASE ID [id number] PA exp 07-05-2022.  Last ship 09/06/2021 next ship date 10/05/2021. Total nightly dosage 7.5 gm. Patient cost $30.

## 2021-11-01 NOTE — Telephone Encounter (Signed)
Received fax pt status report/  Xyrem  [facility] [phone], fx [phone] optum rx CASE ID [id number] PA exp 07-05-2022.  Last ship 10/05/2021 next ship date 11/04/2021. Total nightly dosage 7.5 gm. Patient cost $30.  ?

## 2021-11-06 ENCOUNTER — Encounter: Payer: Self-pay | Admitting: Neurology

## 2021-11-06 DIAGNOSIS — G47411 Narcolepsy with cataplexy: Secondary | ICD-10-CM

## 2021-11-07 MED ORDER — AMPHETAMINE-DEXTROAMPHETAMINE 30 MG PO TABS: 30.0000 mg | ORAL_TABLET | Freq: Two times a day (BID) | ORAL | 0 refills | Status: DC

## 2021-11-07 MED ORDER — AMPHETAMINE-DEXTROAMPHETAMINE 10 MG PO TABS: ORAL_TABLET | ORAL | 0 refills | Status: DC

## 2021-11-07 NOTE — Telephone Encounter (Addendum)
Per [place] registry, last filled #60/30 of Dextroamp-Amphetamin 30 Mg Tab on 09/22/2021. Last filled #30/30 of Dextroamp-Amphetamin 10 Mg Tab on 08/10/21.  ? ?Last visit: 11/16/20 ?Next: 11/16/21 ? ?Rx refill sent to Dr Frances Furbish.  ?

## 2021-11-15 ENCOUNTER — Other Ambulatory Visit: Payer: Self-pay | Admitting: Neurology

## 2021-11-15 MED ORDER — SODIUM OXYBATE 500 MG/ML PO SOLN: ORAL | 0 refills | Status: DC

## 2021-11-15 NOTE — Telephone Encounter (Signed)
Last visit: 11/16/20 ?Next visit: 11/16/21 ? ?Per [place] registry, last filled on 11/04/2021 #450 mL/30 days. Rx refill sent to Dr Rexene Alberts.  ?

## 2021-11-15 NOTE — Telephone Encounter (Signed)
[facility]) request refill for Sodium Oxybate (XYREM) 500 MG/ML SOLN at [facility]  ?

## 2021-11-16 ENCOUNTER — Encounter: Payer: Self-pay | Admitting: Neurology

## 2021-11-16 ENCOUNTER — Ambulatory Visit: Payer: 59 | Admitting: Neurology

## 2021-11-16 VITALS — BP 103/60 | HR 67 | Ht 60.0 in | Wt 123.0 lb

## 2021-11-16 DIAGNOSIS — G47411 Narcolepsy with cataplexy: Secondary | ICD-10-CM

## 2021-11-16 NOTE — Patient Instructions (Signed)
There is a shortage on Adderall.  We will try to maintain your prescription for Adderall ?
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
## 2021-11-17 ENCOUNTER — Telehealth: Payer: Self-pay | Admitting: Neurology

## 2021-11-17 NOTE — Telephone Encounter (Signed)
[facility] needing refill prescription for Sodium Oxybate (XYREM) 500 MG/ML SOLN Physician would need to sign to allow the substitution Sodium Oxybate oral solution . Would like a call from the nurse. ? ?Fax; [phone] ?

## 2021-11-21 NOTE — Telephone Encounter (Signed)
I called and was able to give verbal of the 11/15/2021 prescription that was (phone in and not escribed) and was not phoned in.   They did not get this at [facility] pharmacy.  I reiterated the order to [facility].   ?

## 2021-12-06 ENCOUNTER — Other Ambulatory Visit: Payer: Self-pay | Admitting: *Deleted

## 2021-12-06 NOTE — Telephone Encounter (Signed)
Last seen 11-16-2021. [place] drug registry checked last fill 12-05-2021 423ml/ 30 day, no refill. Next appt 11-20-2022.  Do you want refills #5? ?

## 2021-12-07 MED ORDER — SODIUM OXYBATE 500 MG/ML PO SOLN: ORAL | 5 refills | Status: DC

## 2021-12-08 NOTE — Telephone Encounter (Signed)
Attempted an escript for 6 month supply and was not able to fax.  Did an xyrem/sodium oxybate form/ completed will need to be signed then faxed over.   ?

## 2021-12-12 ENCOUNTER — Telehealth: Payer: Self-pay | Admitting: *Deleted

## 2021-12-12 NOTE — Telephone Encounter (Signed)
Completed XYREM [facility] prescription form for pt.  12-09-2021 for 3.75g + 3.75g =7.5g total nightly dose. One Month supply,  with 5 refills.   Fax confirmation received [phone],  [phone].  ?

## 2021-12-14 ENCOUNTER — Telehealth: Payer: Self-pay | Admitting: Neurology

## 2021-12-14 NOTE — Telephone Encounter (Signed)
Med List faxed to [facility] for Xyrem [phone]  with fax confirmation received.   ?

## 2021-12-14 NOTE — Telephone Encounter (Signed)
[facility] calling requesting med list of pt before they refill XYREM. ?Med list can be called or faxed in.  ?[id number]  ?Fax: [phone] ?

## 2021-12-21 NOTE — Telephone Encounter (Signed)
Received fax concerning [facility] CASE ID [id number] optum RX PA expiration 07/05/2022 last shipped 12-04-2021 Sodium Oxybate [facility] [phone], fx [phone]. ?

## 2021-12-22 ENCOUNTER — Encounter: Payer: Self-pay | Admitting: Neurology

## 2021-12-22 DIAGNOSIS — G47411 Narcolepsy with cataplexy: Secondary | ICD-10-CM

## 2021-12-22 MED ORDER — AMPHETAMINE-DEXTROAMPHETAMINE 30 MG PO TABS: 30.0000 mg | ORAL_TABLET | Freq: Two times a day (BID) | ORAL | 0 refills | Status: DC

## 2021-12-22 MED ORDER — AMPHETAMINE-DEXTROAMPHETAMINE 10 MG PO TABS: ORAL_TABLET | ORAL | 0 refills | Status: DC

## 2021-12-22 NOTE — Telephone Encounter (Signed)
Last visit: 11/16/21 ?Next visit: 11/20/22 ?Per [place] registry, last filled adderall 10 mg on 11/15/2021 #30/30 and last filled Adderall 30 mg on 11/07/2021 #60/30. Rx refills sent to Dr Frances Furbish to e-scribe.  ?

## 2022-01-10 NOTE — Telephone Encounter (Signed)
Received fax concerning [facility] CASE ID [id number] optum RX PA expiration 07/05/2022 last shipped 01-03-2022 next scheduled ship date 02-02-22 Sodium Oxybate 7.5 grams [facility] [phone], fx [phone].

## 2022-01-31 ENCOUNTER — Encounter: Payer: Self-pay | Admitting: Neurology

## 2022-01-31 DIAGNOSIS — G47411 Narcolepsy with cataplexy: Secondary | ICD-10-CM

## 2022-01-31 MED ORDER — AMPHETAMINE-DEXTROAMPHETAMINE 30 MG PO TABS: 30.0000 mg | ORAL_TABLET | Freq: Two times a day (BID) | ORAL | 0 refills | Status: DC

## 2022-01-31 MED ORDER — AMPHETAMINE-DEXTROAMPHETAMINE 10 MG PO TABS: ORAL_TABLET | ORAL | 0 refills | Status: DC

## 2022-01-31 NOTE — Telephone Encounter (Signed)
Last seen 11-16-2021 , the will see in a year 11-20-2022 scheduled.

## 2022-02-01 NOTE — Telephone Encounter (Signed)
Received fax that sodium Oxybate OS last shipped 01-03-2022, next shipment 02-02-2022 [facility] Case ID [id number].

## 2022-02-06 NOTE — Telephone Encounter (Signed)
Received fax concerning [facility] CASE ID [id number] optum RX PA expiration 07/05/2022 last shipped 02-02-2022 next scheduled ship date 03-04-22 Sodium Oxybate 7.5 grams [facility] [phone], fx [phone].

## 2022-03-05 ENCOUNTER — Encounter: Payer: Self-pay | Admitting: Neurology

## 2022-03-05 DIAGNOSIS — G47411 Narcolepsy with cataplexy: Secondary | ICD-10-CM

## 2022-03-06 MED ORDER — AMPHETAMINE-DEXTROAMPHETAMINE 30 MG PO TABS: 30.0000 mg | ORAL_TABLET | Freq: Two times a day (BID) | ORAL | 0 refills | Status: DC

## 2022-03-06 MED ORDER — AMPHETAMINE-DEXTROAMPHETAMINE 10 MG PO TABS: ORAL_TABLET | ORAL | 0 refills | Status: DC

## 2022-03-06 NOTE — Telephone Encounter (Signed)
Last visit: 11/21/21 Next visit: 11/20/22 Per [place] registry, last filled Dextroamp-Amphetamin 30 Mg Tab #60/30 and Dextroamp-Amphetamin 10 Mg Tab #30/30 on 01/31/22. Rx refills sent to Dr Frances Furbish to approve.

## 2022-03-28 NOTE — Telephone Encounter (Signed)
Received from [facility] shipped to pt fax that last shipment was 03-06-2022 and next date for shipment is 04-03-2022. [facility] Case ID [id number]. PA expiration is 11-22/2023.

## 2022-04-04 NOTE — Telephone Encounter (Signed)
Received fax from [facility] optum RX  next shipment 04-03-2022 Sodium Oxybate OS PA exp 07-05-2022. [phone], fax [phone].

## 2022-04-11 ENCOUNTER — Encounter: Payer: Self-pay | Admitting: Neurology

## 2022-04-11 DIAGNOSIS — G47411 Narcolepsy with cataplexy: Secondary | ICD-10-CM

## 2022-04-11 NOTE — Telephone Encounter (Signed)
Received fax concerning [facility] CASE ID [id number] optum RX PA expiration 07/05/2022 last shipped 04-03-22 next scheduled ship date 05-03-22 Sodium Oxybate 7.5 grams [facility] [phone], fx [phone].

## 2022-04-12 MED ORDER — AMPHETAMINE-DEXTROAMPHETAMINE 10 MG PO TABS: ORAL_TABLET | ORAL | 0 refills | Status: DC

## 2022-04-12 MED ORDER — AMPHETAMINE-DEXTROAMPHETAMINE 30 MG PO TABS: 30.0000 mg | ORAL_TABLET | Freq: Two times a day (BID) | ORAL | 0 refills | Status: DC

## 2022-04-12 NOTE — Telephone Encounter (Signed)
Last visit: 11/16/21 Next visit: 11/20/22 Per  registry, last filled Dextroamp-Amphetamin 30 Mg Tab #60/30 and Dextroamp-Amphetamin 10 Mg Tab #30/30 on 03/06/22.   Rx refill requests sent to Dr Frances Furbish to e-scribe

## 2022-05-09 NOTE — Telephone Encounter (Signed)
Received fax concerning [facility] CASE ID [id number] optum RX PA expiration 07/05/2022 last shipped 05-03-22 next scheduled ship date 06-02-22 Sodium Oxybate 7.5 grams [facility] [phone], fx [phone].

## 2022-05-10 ENCOUNTER — Ambulatory Visit: Payer: 59 | Admitting: Family Medicine

## 2022-05-10 ENCOUNTER — Other Ambulatory Visit: Payer: Self-pay | Admitting: *Deleted

## 2022-05-10 VITALS — BP 102/70 | HR 78 | Temp 98.8°F | Resp 12 | Ht 60.0 in | Wt 134.1 lb

## 2022-05-10 DIAGNOSIS — R300 Dysuria: Secondary | ICD-10-CM

## 2022-05-10 DIAGNOSIS — N390 Urinary tract infection, site not specified: Secondary | ICD-10-CM | POA: Diagnosis not present

## 2022-05-10 LAB — POCT URINALYSIS DIPSTICK
Bilirubin, UA: NEGATIVE
Blood, UA: POSITIVE
Glucose, UA: NEGATIVE
Ketones, UA: NEGATIVE
Nitrite, UA: NEGATIVE
Protein, UA: NEGATIVE
Spec Grav, UA: 1.01 (ref 1.010–1.025)
Urobilinogen, UA: 1 E.U./dL
pH, UA: 8.5 — AB (ref 5.0–8.0)

## 2022-05-10 MED ORDER — NITROFURANTOIN MONOHYD MACRO 100 MG PO CAPS: 100.0000 mg | ORAL_CAPSULE | Freq: Two times a day (BID) | ORAL | 0 refills | Status: AC

## 2022-05-10 MED ORDER — SODIUM OXYBATE 500 MG/ML PO SOLN: ORAL | 5 refills | Status: DC

## 2022-05-10 NOTE — Telephone Encounter (Signed)
Need xyrem rems form completed for prescription. [place] drug registry checked last fill 05-03-2022 #450 ml/30 days.  Form completed and to be signed by Dr. Rexene Alberts then will fax back to [phone].

## 2022-05-10 NOTE — Telephone Encounter (Signed)
Receive fax that next shipment date is 06-02-2022. Needs new prescription.

## 2022-05-10 NOTE — Progress Notes (Unsigned)
ACUTE VISIT Chief Complaint  Patient presents with   Urinary Tract Infection    Started early this morning, dysuria.   HPI: [name] is a 51 y.o. female, who is here today complaining of urinary symptoms that is started this morning. She has "never" had a UTI before. Dysuria  This is a new problem. The current episode started today. The problem has been gradually worsening. The quality of the pain is described as burning. The pain is mild. There has been no fever. She is Sexually active. There is No history of pyelonephritis. Associated symptoms include frequency, hesitancy and urgency. Pertinent negatives include no chills, discharge, flank pain, hematuria, nausea, sweats or vomiting. She has tried nothing for the symptoms. There is no history of recurrent UTIs or a urological procedure.  She has not had fever, change in appetite, abdominal pain, changes in bowel habits, back pain, vaginal bleeding. S/P hysterectomy.  Review of Systems  Constitutional:  Positive for fatigue (No more than usual). Negative for chills.  Respiratory:  Negative for cough, shortness of breath and wheezing.   Gastrointestinal:  Negative for nausea and vomiting.  Genitourinary:  Positive for dysuria, frequency, hesitancy and urgency. Negative for flank pain and hematuria.  Neurological:  Negative for syncope and weakness.  Rest see pertinent positives and negatives per HPI.  Current Outpatient Medications on File Prior to Visit  Medication Sig Dispense Refill   amphetamine-dextroamphetamine (ADDERALL) 10 MG tablet Daily at 11 AM. Along with 30 mg bid, separate Rx 30 tablet 0   amphetamine-dextroamphetamine (ADDERALL) 30 MG tablet Take 1 tablet by mouth 2 (two) times daily with a meal. 60 tablet 0   buPROPion (WELLBUTRIN XL) 300 MG 24 hr tablet Take 1 tablet (300 mg total) by mouth daily. 90 tablet 3   DULoxetine (CYMBALTA) 60 MG capsule TAKE 1 CAPSULE BY MOUTH EVERY DAY 90 capsule 3   Sodium  Oxybate (XYREM) 500 MG/ML SOLN Take 3.75gms for 1st nightly dose at bedtime and 3.75 gms for 2nd nightly dose 2.5-4 hours later.  (Total dose 7.5grams nightly) 450 mL 5   No current facility-administered medications on file prior to visit.   Past Medical History:  Diagnosis Date   AC separation 09/2014   left shoulder   Anxiety    Carpal tunnel syndrome of right wrist    Complication of anesthesia    Dental crowns present    GERD (gastroesophageal reflux disease)    Gaviscon as needed   Migraines    Narcolepsy    PONV (postoperative nausea and vomiting)    nausea   Allergies  Allergen Reactions   Meclizine Hcl Other (See Comments)    SEVERE HEARTBURN   Morphine Itching   Percocet [Oxycodone-Acetaminophen] Itching   Social History   Socioeconomic History   Marital status: Married    Spouse name: Cynthia [name]   Number of children: 2   Years of education: Bachelors   Highest education level: Bachelor's degree (e.g., BA, AB, BS)  Occupational History   Occupation: Education officer, museum    Employer: [employer]  Tobacco Use   Smoking status: Former    Packs/day: 0.00    Years: 0.00    Total pack years: 0.00    Types: Cigarettes    Quit date: 08/13/2005    Years since quitting: 16.7   Smokeless tobacco: Never  Substance and Sexual Activity   Alcohol use: No    Alcohol/week: 0.0 standard drinks of alcohol   Drug use: No  Sexual activity: Not on file  Other Topics Concern   Not on file  Social History Narrative   Pt lives at home with husband Cynthia [name]) and two children   Pt is left handed    Education - Bachelors degree   Caffeine 1 cup a day   Social Determinants of Health   Financial Resource Strain: Unknown (05/10/2022)   Overall Financial Resource Strain (CARDIA)    Difficulty of Paying Living Expenses: Patient refused  Food Insecurity: Unknown (05/10/2022)   Hunger Vital Sign    Worried About Running Out of Food in the Last Year: Patient refused    Ware in the Last Year: Patient refused  Transportation Needs: No Transportation Needs (05/10/2022)   PRAPARE - Hydrologist (Medical): No    Lack of Transportation (Non-Medical): No  Physical Activity: Unknown (05/10/2022)   Exercise Vital Sign    Days of Exercise per Week: Patient refused    Minutes of Exercise per Session: Not on file  Stress: No Stress Concern Present (05/10/2022)   [place]    Feeling of Stress : Only a little  Social Connections: Unknown (05/10/2022)   Social Connection and Isolation Panel [NHANES]    Frequency of Communication with Friends and Family: Patient refused    Frequency of Social Gatherings with Friends and Family: Patient refused    Attends Religious Services: Patient refused    Active Member of Clubs or Organizations: Yes    Attends Archivist Meetings: Patient refused    Marital Status: Married   Vitals:   05/10/22 1113  BP: 102/70  Pulse: 78  Resp: 12  Temp: 98.8 F (37.1 C)  SpO2: 98%  Body mass index is 26.19 kg/m.  Physical Exam Constitutional:      General: She is not in acute distress.    Appearance: She is well-developed.  HENT:     Head: Atraumatic.  Eyes:     Conjunctiva/sclera: Conjunctivae normal.  Cardiovascular:     Rate and Rhythm: Normal rate and regular rhythm.  Pulmonary:     Effort: Pulmonary effort is normal. No respiratory distress.     Breath sounds: Normal breath sounds.  Abdominal:     Palpations: Abdomen is soft. There is no mass.     Tenderness: There is no abdominal tenderness.  Lymphadenopathy:     Cervical: No cervical adenopathy.  Skin:    General: Skin is warm.     Findings: No erythema.  Neurological:     Mental Status: She is alert and oriented to person, place, and time.  Psychiatric:        Mood and Affect: Mood is anxious.        Speech: Speech normal.   ASSESSMENT AND  PLAN:  [name] was seen today for urinary tract infection.  Diagnoses and all orders for this visit: Orders Placed This Encounter  Procedures   Culture, Urine   POCT urinalysis dipstick   Dysuria We discussed differential diagnosis. Urine dipstick today with mild abnormalities, positive for blood and a small amount of leukocytes, negative nitrite. PH 8.5. Urine culture sent.  Urinary tract infection without hematuria, site unspecified Empiric abx treatment with Macrobid started today. We will tailor treatment according to Ucx results and susceptibility report. Continue adequate hydration. OTC vitamin C or cranberry pills may help with acidification of urine.  Clearly instructed about warning signs. F/U as needed.  -  nitrofurantoin, macrocrystal-monohydrate, (MACROBID) 100 MG capsule; Take 1 capsule (100 mg total) by mouth 2 (two) times daily for 5 days.  Return if symptoms worsen or fail to improve.  Dlisa Barnwell G. Martinique, MD  [facility]. [place] office.

## 2022-05-10 NOTE — Patient Instructions (Signed)
A few things to remember from today's visit:   Dysuria - Plan: POCT urinalysis dipstick, Culture, Urine, Culture, Urine  Urinary tract infection without hematuria, site unspecified - Plan: nitrofurantoin, macrocrystal-monohydrate, (MACROBID) 100 MG capsule  Adequate fluid intake, avoid holding urine for long hours, and over the counter Vit C OR cranberry capsules might help.  Today we will treat empirically with antibiotic, which we might need to change when urine culture comes back depending of bacteria susceptibility.  Seek immediate medical attention if severe abdominal pain, vomiting, fever/chills, or worsening symptoms. F/U if symptomatic are not any better after 2-3 days of antibiotic treatment.  If you need refills for medications you take chronically, please call your pharmacy. Do not use My Chart to request refills or for acute issues that need immediate attention. If you send a my chart message, it may take a few days to be addressed, specially if I am not in the office.  Please be sure medication list is accurate. If a new problem present, please set up appointment sooner than planned today.

## 2022-05-13 LAB — URINE CULTURE
MICRO NUMBER:: 13975553
SPECIMEN QUALITY:: ADEQUATE

## 2022-05-22 ENCOUNTER — Telehealth: Payer: Self-pay | Admitting: Neurology

## 2022-05-22 NOTE — Telephone Encounter (Addendum)
Marlena from [facility] called and LVM stating that they did receive the refill request for the pt's Sodium Oxybate (XYREM) 500 MG/ML SOLN but they are needing the pt's Medication list. It can be faxed to [phone] or called in with a VO at [phone] Option 3 then 4

## 2022-05-22 NOTE — Telephone Encounter (Signed)
Pt's medication list has been faxed to [facility] pharmacy [phone]. Received a receipt of confirmation.

## 2022-05-24 ENCOUNTER — Encounter: Payer: Self-pay | Admitting: Neurology

## 2022-05-24 ENCOUNTER — Other Ambulatory Visit: Payer: Self-pay | Admitting: *Deleted

## 2022-05-24 DIAGNOSIS — G47411 Narcolepsy with cataplexy: Secondary | ICD-10-CM

## 2022-05-24 MED ORDER — AMPHETAMINE-DEXTROAMPHETAMINE 30 MG PO TABS: 30.0000 mg | ORAL_TABLET | Freq: Two times a day (BID) | ORAL | 0 refills | Status: DC

## 2022-05-24 MED ORDER — AMPHETAMINE-DEXTROAMPHETAMINE 10 MG PO TABS: ORAL_TABLET | ORAL | 0 refills | Status: DC

## 2022-05-30 ENCOUNTER — Telehealth: Payer: Self-pay | Admitting: Neurology

## 2022-05-30 NOTE — Telephone Encounter (Signed)
Called [facility] back and spoke with Adel. She said they actually have everything they need to process prescription. No further action needed at this time.

## 2022-05-30 NOTE — Telephone Encounter (Signed)
Cynthia [name] is calling from [employer]. Stating he need a copy of Pt health conditions. Please fax [phone]

## 2022-06-06 NOTE — Telephone Encounter (Signed)
Approved today Request Reference Number: [id number]. SOD OXYBATE SOL 500MG /ML is approved through 06/07/2023. Your patient may now fill this prescription and it will be covered.  Faxed approval letter to [facility] [phone]. Received a receipt of confirmation. Received a receipt of confirmation.

## 2022-06-06 NOTE — Telephone Encounter (Signed)
Competed Xyrem PA on Cover My Meds for continuation. Key: [id number]. Awaiting determination from [facility].

## 2022-06-14 ENCOUNTER — Telehealth: Payer: Self-pay | Admitting: *Deleted

## 2022-06-14 NOTE — Telephone Encounter (Signed)
Received fax that [facility]  XYREM  last shipped 06-02-2022 next shipment 07-02-2022.  [phone], fax [phone]

## 2022-06-19 NOTE — Telephone Encounter (Signed)
Received fax concerning [facility] CASE ID [id number] [facility] expiration 06/07/2023 last shipped 06-02-22 next scheduled ship date 07-02-22 Sodium Oxybate 7.5 grams [facility] [phone], fx [phone].

## 2022-06-26 ENCOUNTER — Encounter: Payer: Self-pay | Admitting: Neurology

## 2022-06-26 DIAGNOSIS — G47411 Narcolepsy with cataplexy: Secondary | ICD-10-CM

## 2022-06-27 MED ORDER — AMPHETAMINE-DEXTROAMPHETAMINE 30 MG PO TABS: 30.0000 mg | ORAL_TABLET | Freq: Two times a day (BID) | ORAL | 0 refills | Status: DC

## 2022-06-27 MED ORDER — AMPHETAMINE-DEXTROAMPHETAMINE 10 MG PO TABS: ORAL_TABLET | ORAL | 0 refills | Status: DC

## 2022-06-27 NOTE — Telephone Encounter (Signed)
Last visit 11/16/21 Next visit 11/19/21  Per [place] registry, last filled adderall 30 mg #60/30 and adderall 10 mg #30/30 on 05/24/2022. Rx refill requests sent to Dr Frances Furbish to e-scribe.

## 2022-06-28 ENCOUNTER — Other Ambulatory Visit: Payer: Self-pay | Admitting: Family Medicine

## 2022-06-28 DIAGNOSIS — F341 Dysthymic disorder: Secondary | ICD-10-CM

## 2022-06-28 DIAGNOSIS — F411 Generalized anxiety disorder: Secondary | ICD-10-CM

## 2022-07-24 NOTE — Telephone Encounter (Signed)
Received fax concerning [facility] CASE ID [id number] [facility] expiration 06/07/2023 last shipped 07-04-22 next scheduled ship date 08-01-22 Sodium Oxybate 7.5 grams [facility] [phone], fx [phone].

## 2022-08-09 ENCOUNTER — Encounter: Payer: Self-pay | Admitting: Family Medicine

## 2022-08-09 ENCOUNTER — Ambulatory Visit: Payer: 59 | Admitting: Family Medicine

## 2022-08-09 VITALS — BP 120/80 | HR 82 | Temp 101.5°F | Ht 60.0 in | Wt 133.0 lb

## 2022-08-09 DIAGNOSIS — R52 Pain, unspecified: Secondary | ICD-10-CM | POA: Diagnosis not present

## 2022-08-09 DIAGNOSIS — R509 Fever, unspecified: Secondary | ICD-10-CM

## 2022-08-09 DIAGNOSIS — R059 Cough, unspecified: Secondary | ICD-10-CM | POA: Diagnosis not present

## 2022-08-09 DIAGNOSIS — J101 Influenza due to other identified influenza virus with other respiratory manifestations: Secondary | ICD-10-CM

## 2022-08-09 LAB — POCT INFLUENZA A/B
Influenza A, POC: POSITIVE — AB
Influenza B, POC: NEGATIVE

## 2022-08-09 LAB — POC COVID19 BINAXNOW: SARS Coronavirus 2 Ag: NEGATIVE

## 2022-08-09 MED ORDER — OSELTAMIVIR PHOSPHATE 75 MG PO CAPS: 75.0000 mg | ORAL_CAPSULE | Freq: Two times a day (BID) | ORAL | 0 refills | Status: DC

## 2022-08-09 NOTE — Progress Notes (Signed)
Subjective:     Patient ID: Cynthia [name], female    DOB: 09/13/1970, 51 y.o.   MRN: [id number]  Chief Complaint  Patient presents with   Nasal Congestion   Chest Congestion   Headache   Generalized Body Aches   Cough    Dry cough Sx started Christmas Day    Fever    Has been taking Mucinex and Tylenol    HPI Since 12/25-congestion, HA,myalgias, dry cough.  Fever today.  No v/d.  Some sob.  No flu shot.   Health Maintenance Due  Topic Date Due   Hepatitis C Screening  Never done   COLONOSCOPY (Pts 45-54yrs Insurance coverage will need to be confirmed)  Never done    Past Medical History:  Diagnosis Date   AC separation 09/2014   left shoulder   Anxiety    Carpal tunnel syndrome of right wrist    Complication of anesthesia    Dental crowns present    GERD (gastroesophageal reflux disease)    Gaviscon as needed   Migraines    Narcolepsy    PONV (postoperative nausea and vomiting)    nausea    Past Surgical History:  Procedure Laterality Date   ACROMIO-CLAVICULAR JOINT REPAIR Left 10/12/2014   Procedure: Left shoulder acromio-clavicular joint repair;  Surgeon: Mable Paris, MD;  Location: [facility];  Service: Orthopedics;  Laterality: Left;  Left shoulder acromio-clavicular joint repair   BUNIONECTOMY Bilateral    CESAREAN SECTION  10/14/2005; 08/23/2007   CYSTOSCOPY  07/26/2009   ENDOMETRIAL ABLATION  11/14/2002   EUS N/A 01/22/2014   Procedure: UPPER ENDOSCOPIC ULTRASOUND (EUS) LINEAR;  Surgeon: Rachael Fee, MD;  Location: [facility];  Service: Endoscopy;  Laterality: N/A;   LAPAROSCOPIC LYSIS OF ADHESIONS  04/30/2009   LAPAROSCOPIC OVARIAN CYSTECTOMY Left 11/14/2002   LAPAROSCOPIC SUPRACERVICAL HYSTERECTOMY  07/26/2009   LAPAROSCOPIC UNILATERAL SALPINGO OOPHERECTOMY Right 07/26/2009   TUBAL LIGATION  08/23/2007    Outpatient Medications Prior to Visit  Medication Sig Dispense Refill   amphetamine-dextroamphetamine (ADDERALL) 10 MG  tablet Daily at 11 AM. Along with 30 mg bid, separate Rx 30 tablet 0   amphetamine-dextroamphetamine (ADDERALL) 30 MG tablet Take 1 tablet by mouth 2 (two) times daily with a meal. 60 tablet 0   buPROPion (WELLBUTRIN XL) 300 MG 24 hr tablet TAKE 1 TABLET BY MOUTH EVERY DAY 30 tablet 2   DULoxetine (CYMBALTA) 60 MG capsule TAKE 1 CAPSULE BY MOUTH EVERY DAY 90 capsule 0   Sodium Oxybate (XYREM) 500 MG/ML SOLN Take 3.75gms for 1st nightly dose at bedtime and 3.75 gms for 2nd nightly dose 2.5-4 hours later.  (Total dose 7.5grams nightly) 450 mL 5   No facility-administered medications prior to visit.    Allergies  Allergen Reactions   Meclizine Hcl Other (See Comments)    SEVERE HEARTBURN   Morphine Itching   Percocet [Oxycodone-Acetaminophen] Itching   ROS neg/noncontributory except as noted HPI/below      Objective:     BP 120/80   Pulse 82   Temp (!) 101.5 F (38.6 C) (Temporal)   Ht 5' (1.524 m)   Wt 133 lb (60.3 kg)   SpO2 99%   BMI 25.97 kg/m  Wt Readings from Last 3 Encounters:  08/09/22 133 lb (60.3 kg)  05/10/22 134 lb 2 oz (60.8 kg)  11/16/21 123 lb (55.8 kg)    Physical Exam   Gen: WDWN NAD HEENT: NCAT, conjunctiva not injected, sclera nonicteric CARDIAC:  RRR, S1S2+, no murmur.  LUNGS: CTAB. No wheezes EXT:  no edema MSK: no gross abnormalities.  NEURO: A&O x3.  CN II-XII intact.  PSYCH: normal mood. Good eye contact  Results for orders placed or performed in visit on 08/09/22  POC COVID-19  Result Value Ref Range   SARS Coronavirus 2 Ag Negative Negative  POCT Influenza A/B  Result Value Ref Range   Influenza A, POC Positive (A) Negative   Influenza B, POC Negative Negative        Assessment & Plan:   Problem List Items Addressed This Visit   None Visit Diagnoses     Influenza A    -  Primary   Relevant Medications   oseltamivir (TAMIFLU) 75 MG capsule   Fever, unspecified fever cause       Relevant Orders   POC COVID-19 (Completed)    POCT Influenza A/B (Completed)   Cough, unspecified type       Relevant Orders   POC COVID-19 (Completed)   POCT Influenza A/B (Completed)   Generalized body aches       Relevant Orders   POC COVID-19 (Completed)   POCT Influenza A/B (Completed)      Influenza A-tamiflu 75mg  bid.  Fluids, symptomatic tx.  Off work till 1/2  Meds ordered this encounter  Medications   oseltamivir (TAMIFLU) 75 MG capsule    Sig: Take 1 capsule (75 mg total) by mouth 2 (two) times daily.    Dispense:  10 capsule    Refill:  0    , MD

## 2022-08-09 NOTE — Patient Instructions (Signed)
You have flu.  Tamiflu  Tylenol/ibuprofen/rest.  Fluids.

## 2022-08-10 NOTE — Telephone Encounter (Signed)
Received fax concerning [facility] CASE ID [id number] [facility] expiration 06/07/2023 last shipped 08-01-22 next scheduled ship date 08-31-2022 Sodium Oxybate 7.5 grams [facility] [phone], fx [phone].

## 2022-08-16 ENCOUNTER — Encounter: Payer: Self-pay | Admitting: Neurology

## 2022-08-16 DIAGNOSIS — G47411 Narcolepsy with cataplexy: Secondary | ICD-10-CM

## 2022-08-16 MED ORDER — AMPHETAMINE-DEXTROAMPHETAMINE 30 MG PO TABS: 30.0000 mg | ORAL_TABLET | Freq: Two times a day (BID) | ORAL | 0 refills | Status: DC

## 2022-08-16 MED ORDER — AMPHETAMINE-DEXTROAMPHETAMINE 10 MG PO TABS: ORAL_TABLET | ORAL | 0 refills | Status: DC

## 2022-08-30 NOTE — Telephone Encounter (Signed)
Fax received last shipment receive 08-01-2022 nex shipment due 08-31-2022 Sodium Oxybate OS  CASE ID [id number] [facility]  EXP date 06-07-2023 .  [name] [phone], [place].

## 2022-09-12 NOTE — Telephone Encounter (Signed)
Fax received last shipment 08/31/2022; 09/30/2022 next shipment date Sodium Oxybate OS  CASE ID [id number] [facility]  EXP date 06-07-2023 .  [facility] [phone], FAX [phone].

## 2022-09-20 ENCOUNTER — Encounter: Payer: Self-pay | Admitting: Neurology

## 2022-09-20 DIAGNOSIS — G47411 Narcolepsy with cataplexy: Secondary | ICD-10-CM

## 2022-09-20 MED ORDER — AMPHETAMINE-DEXTROAMPHETAMINE 10 MG PO TABS: ORAL_TABLET | ORAL | 0 refills | Status: DC

## 2022-09-20 MED ORDER — AMPHETAMINE-DEXTROAMPHETAMINE 30 MG PO TABS: 30.0000 mg | ORAL_TABLET | Freq: Two times a day (BID) | ORAL | 0 refills | Status: DC

## 2022-09-20 NOTE — Telephone Encounter (Signed)
Last seen 11/16/21 Next visit 11/20/22 Rx requests sent to Dr Rexene Alberts to approve

## 2022-10-01 ENCOUNTER — Other Ambulatory Visit: Payer: Self-pay | Admitting: Family Medicine

## 2022-10-01 DIAGNOSIS — F411 Generalized anxiety disorder: Secondary | ICD-10-CM

## 2022-10-01 DIAGNOSIS — F341 Dysthymic disorder: Secondary | ICD-10-CM

## 2022-10-12 NOTE — Telephone Encounter (Signed)
Received fax. Last ship date 10-03-2022, next scheduled date 10-30-2022. PA exp 06-07-2023.   [facility] [phone], fx [phone].

## 2022-10-17 ENCOUNTER — Telehealth: Payer: Self-pay | Admitting: *Deleted

## 2022-10-17 NOTE — Telephone Encounter (Signed)
Completed prescription for Sodium Oxybate for pt.  To Dr. Rexene Alberts to sign .

## 2022-10-18 NOTE — Telephone Encounter (Signed)
Signed and faxed to [phone]. Fax confirmation received.

## 2022-10-31 ENCOUNTER — Encounter: Payer: Self-pay | Admitting: Neurology

## 2022-10-31 DIAGNOSIS — G47411 Narcolepsy with cataplexy: Secondary | ICD-10-CM

## 2022-11-01 MED ORDER — AMPHETAMINE-DEXTROAMPHETAMINE 10 MG PO TABS: ORAL_TABLET | ORAL | 0 refills | Status: DC

## 2022-11-01 MED ORDER — AMPHETAMINE-DEXTROAMPHETAMINE 30 MG PO TABS: 30.0000 mg | ORAL_TABLET | Freq: Two times a day (BID) | ORAL | 0 refills | Status: DC

## 2022-11-01 NOTE — Telephone Encounter (Signed)
Pt has appt 11-20-2022, last seen 11-15-2021.

## 2022-11-08 NOTE — Telephone Encounter (Signed)
Received fax. Last ship date 10-30-2022. Next scheduled date 11-29-2022.  Total nightly dose 7.5 GMS. [facility] [phone].

## 2022-11-20 ENCOUNTER — Ambulatory Visit: Payer: 59 | Admitting: Neurology

## 2022-11-20 ENCOUNTER — Encounter: Payer: Self-pay | Admitting: Neurology

## 2022-11-20 VITALS — BP 94/61 | HR 73 | Ht 60.0 in | Wt 128.2 lb

## 2022-11-20 DIAGNOSIS — G47411 Narcolepsy with cataplexy: Secondary | ICD-10-CM

## 2022-11-20 NOTE — Patient Instructions (Signed)
It was nice to see you as always. We will continue with your current medication regimen.

## 2022-11-20 NOTE — Progress Notes (Signed)
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 cannot guarantee that Cynthia Cynthia [name] would not fall asleep while driving even in a shorter distance or time frame. Cynthia Cynthia [name] called back after Cynthia appointment and requested a letter for work. I provided this. Cynthia Cynthia [name] also called back asking to get started on Xyrem. I sent in a prescription for this. Since then, Cynthia Cynthia [name] has called Dr. Shelle Iron on 06/30/13, indicating, that Cynthia Cynthia [name] would lose Cynthia Cynthia [name] job, if Cynthia Cynthia [name] cannot drive and requested  another opinion from another neurologist sleep doctor and was been referred to [facility], but on 07/21/2013, Cynthia Cynthia [name] called requesting an increase in Adderall dose. Cynthia Cynthia [name] had missed an appointment this month. I suggested that Cynthia Cynthia [name] followup with me to discuss Xyrem and a dose increase in Adderall, and I wrote Cynthia Cynthia [name] another prescription for Adderall at Cynthia same dose as before. On 07/11/2013 Cynthia Cynthia [name] presented to Cynthia emergency room with a laceration on Cynthia Cynthia [name] finger. On 07/31/2013 Cynthia Cynthia [name] saw Cynthia Cynthia [name] primary care physician for fever.     Cynthia Cynthia [name] reported that Cynthia Cynthia [name] decided to not pursue Cynthia second sleep medicine opinion at [facility]. Cynthia Cynthia [name] took herself off of Cymbalta 120 mg about a month ago. Cynthia Cynthia [name] stopped abruptly, without telling Cynthia Cynthia [name] PCP. Cynthia Cynthia [name] had more insomnia with it, but Cynthia Cynthia [name] was taking it at night. Cynthia Cynthia [name] has been more anxious and more agitated. Cynthia Cynthia [name] felt, Cynthia Cymbalta was helping Cynthia Cynthia [name] mood. Cynthia Cynthia [name] took klonopin last night and took 2 pills, and while Cynthia Cynthia [name] slept better, Cynthia Cynthia [name] cannot take any during Cynthia day d/t exacerbation of Cynthia Cynthia [name] sleepiness. Cynthia Cynthia [name] is waiting on Cynthia finalization of Cynthia Cynthia [name] Xyrem delivery and will be starting that soon. Cynthia Cynthia [name] said, Cynthia Cynthia [name] was playing "phone tag". Cynthia Cynthia [name] states, Cynthia Cynthia [name] knows, not to come off an antidepressant abruptly. Cynthia Cynthia [name] would like to increase Cynthia Cynthia [name] Adderall and felt, Cynthia Adderall XR was not effective. Cynthia Cynthia [name] had skin picking with Ritalin as well and has started picking again since Cynthia Adderall was re-started; Cymbalta was helping with that. Cynthia Cynthia [name] has no new complaints otherwise. Cynthia Cynthia [name] does endorse stress at work and otherwise. Cynthia Cynthia [name] would like to start something for Cynthia Cynthia [name] mood. Cynthia Cynthia [name] feels overwhelmed. While Cynthia clonazepam helps Cynthia anxiety makes Cynthia Cynthia [name] too sleepy. Cynthia Cynthia [name] has  trouble maintaining sleep at night.   Cynthia Cynthia [name] Past Medical History Is Significant For: Past Medical History:  Diagnosis Date   AC separation 09/2014   left shoulder   Anxiety    Carpal tunnel syndrome of right wrist    Complication of anesthesia    Dental crowns present     GERD (gastroesophageal reflux disease)    Gaviscon as needed   Migraines    Narcolepsy    PONV (postoperative nausea and vomiting)    nausea    Cynthia Cynthia [name] Past Surgical History Is Significant For: Past Surgical History:  Procedure Laterality Date   ACROMIO-CLAVICULAR JOINT REPAIR Left 10/12/2014   Procedure: Left shoulder acromio-clavicular joint repair;  Surgeon: Mable ParisJustin William Chandler, MD;  Location: [facility];  Service: Orthopedics;  Laterality: Left;  Left shoulder acromio-clavicular joint repair   BUNIONECTOMY Bilateral    CESAREAN SECTION  10/14/2005; 08/23/2007   CYSTOSCOPY  07/26/2009   ENDOMETRIAL ABLATION  11/14/2002   EUS N/A 01/22/2014   Procedure: UPPER ENDOSCOPIC ULTRASOUND (EUS) LINEAR;  Surgeon: Rachael Feeaniel P Jacobs, MD;  Location: [facility];  Service: Endoscopy;  Laterality: N/A;   LAPAROSCOPIC LYSIS OF ADHESIONS  04/30/2009   LAPAROSCOPIC OVARIAN CYSTECTOMY Left 11/14/2002   LAPAROSCOPIC SUPRACERVICAL HYSTERECTOMY  07/26/2009   LAPAROSCOPIC UNILATERAL SALPINGO OOPHERECTOMY Right 07/26/2009   TUBAL LIGATION  08/23/2007    Cynthia Cynthia [name] Family History Is Significant For: Family History  Problem Relation Age of Onset   Narcolepsy Sister    Kidney disease Sister    Prostate cancer Maternal Grandfather    Heart disease Paternal Grandfather     Cynthia Cynthia [name] Social History Is Significant For: Social History   Socioeconomic History   Marital status: Married    Spouse name: [name]   Number of children: 2   Years of education: Bachelors   Highest education level: Bachelor's degree (e.g., BA, AB, BS)  Occupational History   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    Employer: [employer]  Tobacco Use   Smoking status: Former    Packs/day: 0.00    Years: 0.00    Additional pack years: 0.00    Total pack years: 0.00    Types: Cigarettes    Quit date: 08/13/2005    Years since quitting: 17.2   Smokeless tobacco: Never  Substance and Sexual Activity   Alcohol use: No    Alcohol/week: 0.0  standard drinks of alcohol   Drug use: No   Sexual activity: Not on file  Other Topics Concern   Not on file  Social History Narrative   Pt lives at home with husband [name]([name] aka Charles) and two children   Pt is left handed    Education - Bachelors degree   Caffeine 1 cup a day   Social Determinants of Health   Financial Resource Strain: Cynthia [name] Declined (05/10/2022)   Overall Financial Resource Strain (CARDIA)    Difficulty of Paying Living Expenses: Cynthia [name] declined  Food Insecurity: Cynthia [name] Declined (05/10/2022)   Hunger Vital Sign    Worried About Running Out of Food in Cynthia Last Year: Cynthia [name] declined    Ran Out of Food in Cynthia Last Year: Cynthia [name] declined  Transportation Needs: No Transportation Needs (05/10/2022)   PRAPARE - Administrator, Civil ServiceTransportation    Lack of Transportation (Medical): No    Lack of Transportation (Non-Medical): No  Physical Activity: Unknown (05/10/2022)   Exercise Vital Sign    Days of Exercise per Week: Cynthia [name] declined    Minutes of Exercise per Session: Not on file  Stress: No Stress Concern Present (05/10/2022)   [employer] of Occupational Health - Occupational Stress Questionnaire    Feeling of Stress : Only a little  Social Connections: Unknown (05/10/2022)   Social Connection and Isolation Panel [NHANES]    Frequency of Communication with Friends and Family: Cynthia [name] declined    Frequency of Social Gatherings with Friends and Family: Cynthia [name] declined    Attends Religious Services: Cynthia [name] declined    Database administrator or Organizations: Yes    Attends Banker Meetings: Cynthia [name] declined    Marital Status: Married    Cynthia Cynthia [name] Allergies Are:  Allergies  Allergen Reactions   Meclizine Hcl Other (See Comments)    SEVERE HEARTBURN   Morphine Itching   Percocet [Oxycodone-Acetaminophen] Itching  :   Cynthia Cynthia [name] Current Medications Are:  Outpatient Encounter Medications as of 11/20/2022  Medication Sig   amphetamine-dextroamphetamine (ADDERALL) 10 MG  tablet Daily at 11 AM. Along with 30 mg bid, separate Rx   amphetamine-dextroamphetamine (ADDERALL) 30 MG tablet Take 1 tablet by mouth 2 (two) times daily with a meal.   buPROPion (WELLBUTRIN XL) 300 MG 24 hr tablet TAKE 1 TABLET BY MOUTH EVERY DAY   DULoxetine (CYMBALTA) 60 MG capsule TAKE 1 CAPSULE BY MOUTH EVERY DAY   Sodium Oxybate (XYREM) 500 MG/ML SOLN Take 3.75gms for 1st nightly dose at bedtime and 3.75 gms for 2nd nightly dose 2.5-4 hours later.  (Total dose 7.5grams nightly)   oseltamivir (TAMIFLU) 75 MG capsule Take 1 capsule (75 mg total) by mouth 2 (two) times daily.   No facility-administered encounter medications on file as of 11/20/2022.  :  Review of Systems:  Out of a complete 14 point review of systems, all are reviewed and negative with Cynthia exception of these symptoms as listed below:  Review of Systems  Neurological:        Pt here for narcolepsy f/u  Pt states increased fatigue in afternoon   ESS:1    Objective:  Neurological Exam  Physical Exam Physical Examination:   Vitals:   11/20/22 0738  BP: 94/61  Pulse: 73    General Examination: Cynthia Cynthia [name] is a very pleasant 52 y.o. female in no acute distress. Cynthia Cynthia [name] appears well-developed and well-nourished and well groomed.   HEENT: Normocephalic, atraumatic, pupils are equal, round and reactive to light. Extraocular tracking is well-preserved, no obvious nystagmus, hearing grossly intact.  Face is symmetric with normal facial animation.  Speech is clear without dysarthria, hypophonia or voice tremor.  Neck with full range of motion.  Mild to moderate mouth dryness noted.  Tongue protrudes centrally and palate elevates symmetrically.   Chest: Clear to auscultation without wheezing, rhonchi or crackles noted.   Heart: S1+S2+0, regular and normal without murmurs, rubs or gallops noted.    Abdomen: Soft, non-tender and non-distended.   Extremities: There is no obvious edema.   Skin: Warm and dry without trophic  changes noted.     Musculoskeletal: exam reveals no obvious joint deformities.    Neurologically:  Mental status: Cynthia Cynthia [name] is awake, alert and oriented in all 4 spheres. Cynthia Cynthia [name] immediate and remote memory, attention, language skills and fund of knowledge are appropriate. There is no evidence of aphasia, agnosia, apraxia or anomia. Speech is clear with normal prosody and enunciation. Thought process is linear. Mood is normal and affect is normal.  Cranial nerves II - XII are as described above under HEENT exam. Motor exam: Normal bulk, strength and tone is noted. There is no  resting or action tremor.  Fine motor skills and coordination: grossly intact. Cerebellar testing: No dysmetria or intention tremor.  Sensory exam: intact to light touch in Cynthia upper and lower extremities.  Gait, station and balance: Cynthia Cynthia [name] stands easily. No veering to one side is noted. No leaning to one side is noted. Posture is age-appropriate and stance is narrow based. Gait shows normal stride length and normal pace. No problems turning are noted.    Assessment and Plan:  In summary, Cynthia Cynthia [name] is a very pleasant 52 year old female with an underlying medical history of anxiety, ADD and recurrent headaches, who presents for followup consultation of Cynthia Cynthia [name] narcolepsy with cataplexy. Cataplexy has not been a symptom in quite some time, thankfully. Cynthia Cynthia [name] has been doing well on Xyrem with good compliance, currently 3.75 g twice nightly, typically around 9 PM and midnight. Over time, Cynthia Cynthia [name] has tried Adderall XR, Adderall IR, Ritalin, Nuvigil and Provigil. Cynthia Cynthia [name] has done well with Xyrem.  Cynthia Cynthia [name] takes it only once a night when Cynthia Cynthia [name] is on call, which is infrequently, thankfully.  Cynthia Cynthia [name] has experienced receding gum lines and had needed surgery for this. We increased Cynthia Cynthia [name] Xyrem in 12/17. Furthermore, we can consider increasing this in Cynthia future for a max dose of 9 g for Cynthia night, if needed. There is a low sodium formulation available as well,  which we discussed and there is a once daily formulation now as well but we mutually agreed to continue with Cynthia Cynthia [name] current medication as Cynthia Cynthia [name] has done well with that and we mutually agreed not to rock Cynthia boat.   Cynthia Cynthia [name] has been taking it on a set schedule and has been reminded to take it only when ready in bed.  Cynthia Cynthia [name] is currently on a total dose of 7.5 g nightly. Cynthia Cynthia [name] has been able to tolerate Cynthia Cynthia [name] immediate release Adderall and Cynthia current regimen works well for Cynthia Cynthia [name], Adderall 30 mg bid, +10 mg daily for a total of 70 mg daily.  Of note, Cynthia Cynthia [name] had interim sleep study testing and office visits through [facility] for second opinion and was cleared fully for driving and using [place] car.  Cynthia Cynthia [name] did not need any refills on Cynthia Cynthia [name] medications today.  Cynthia Cynthia [name] is advised to follow-up routinely in this clinic in 1 year as Cynthia Cynthia [name] is doing well.  Cynthia Cynthia [name] is advised to call or email [place] through MyChart with any interim questions or concerns.  I answered all Cynthia Cynthia [name] questions today and Cynthia Cynthia [name] was in agreement.

## 2022-12-13 ENCOUNTER — Encounter: Payer: Self-pay | Admitting: Neurology

## 2022-12-13 DIAGNOSIS — G47411 Narcolepsy with cataplexy: Secondary | ICD-10-CM

## 2022-12-14 MED ORDER — AMPHETAMINE-DEXTROAMPHETAMINE 10 MG PO TABS: ORAL_TABLET | ORAL | 0 refills | Status: DC

## 2022-12-14 MED ORDER — AMPHETAMINE-DEXTROAMPHETAMINE 30 MG PO TABS: 30.0000 mg | ORAL_TABLET | Freq: Two times a day (BID) | ORAL | 0 refills | Status: DC

## 2022-12-14 NOTE — Telephone Encounter (Signed)
Last seen 11/20/22 Next visit 11/19/23  Per [place] registry:    Rx refills sent to Dr Frances Furbish

## 2022-12-27 NOTE — Telephone Encounter (Signed)
Received fax that shipped  Sodium Oxybate OS 11-29-2022 next shipment is 12-29-2022. [facility].

## 2022-12-29 ENCOUNTER — Other Ambulatory Visit: Payer: Self-pay | Admitting: Family Medicine

## 2022-12-29 DIAGNOSIS — F411 Generalized anxiety disorder: Secondary | ICD-10-CM

## 2022-12-29 DIAGNOSIS — F341 Dysthymic disorder: Secondary | ICD-10-CM

## 2023-01-02 NOTE — Telephone Encounter (Signed)
Received fax that [facility] shipped Sodium Oxybate OS 12-29-2022 next shipment is 01-28-2023. PA expires 06/07/23.

## 2023-01-16 ENCOUNTER — Encounter: Payer: Self-pay | Admitting: Neurology

## 2023-01-16 DIAGNOSIS — G47411 Narcolepsy with cataplexy: Secondary | ICD-10-CM

## 2023-01-17 MED ORDER — AMPHETAMINE-DEXTROAMPHETAMINE 10 MG PO TABS: ORAL_TABLET | ORAL | 0 refills | Status: DC

## 2023-01-17 MED ORDER — AMPHETAMINE-DEXTROAMPHETAMINE 30 MG PO TABS: 30.0000 mg | ORAL_TABLET | Freq: Two times a day (BID) | ORAL | 0 refills | Status: DC

## 2023-01-17 NOTE — Telephone Encounter (Signed)
Last visit 11-20-2022.  11-19-2023 is next follow up.

## 2023-01-30 ENCOUNTER — Encounter: Payer: Self-pay | Admitting: Family Medicine

## 2023-02-12 NOTE — Telephone Encounter (Signed)
Received fax that [facility] shipped Sodium Oxybate OS 01-29-2023 next shipment is 02-27-2023. PA expires 06/07/23.

## 2023-02-27 ENCOUNTER — Encounter: Payer: Self-pay | Admitting: Neurology

## 2023-02-27 DIAGNOSIS — G47411 Narcolepsy with cataplexy: Secondary | ICD-10-CM

## 2023-02-27 MED ORDER — AMPHETAMINE-DEXTROAMPHETAMINE 10 MG PO TABS: ORAL_TABLET | ORAL | 0 refills | Status: DC

## 2023-02-27 MED ORDER — AMPHETAMINE-DEXTROAMPHETAMINE 30 MG PO TABS: 30.0000 mg | ORAL_TABLET | Freq: Two times a day (BID) | ORAL | 0 refills | Status: DC

## 2023-02-27 NOTE — Telephone Encounter (Addendum)
Rx refill requests sent to Dr Frances Furbish  Last visit: 11/20/22 Next visit 11/19/23  Checked [place] registry, last filled:

## 2023-03-13 NOTE — Telephone Encounter (Signed)
Received fax that [facility] shipped Sodium Oxybate OS 02-27-2023 next shipment is 03-29-2023. PA expires 06/07/23.

## 2023-03-14 NOTE — Progress Notes (Signed)
HPI: [name] is a 52 y.o. female, who is here today for her routine physical.  Last CPE: Over a year ago. She sees her gyn annually, last visit 03/2022 and has an apptin 2 weeks.  She reports not exercising regularly and not consuming vegetables daily. Her diet consists of a mix of home-cooked meals and takeout, with chicken and hamburger as her primary protein sources.   She sleeps approximately 9 hours per night. She denies current tobacco use or consuming alcohol regularly. She has a history of smoking but quit in 2016. She has regular eye exams, annually, and visits her dentist at least once per year.  Immunization History  Administered Date(s) Administered   Influenza Inj Mdck Quad Pf 04/23/2019   Influenza Split 05/23/2012   Influenza,inj,Quad PF,6+ Mos 05/02/2013, 04/22/2017, 04/24/2020, 06/08/2021   Influenza-Unspecified 05/02/2017   Pfizer Covid-19 Vaccine Bivalent Booster 25yrs & up 06/17/2021   Tdap 07/11/2013, 03/16/2023   Health Maintenance  Topic Date Due   COVID-19 Vaccine (2 - 2023-24 season) 04/14/2022   INFLUENZA VACCINE  03/15/2023   Colonoscopy  03/15/2024 (Originally 07/15/2018)   Hepatitis C Screening  03/15/2024 (Originally 07/16/1991)   HIV Screening  12/09/2024 (Originally 07/15/1988)   DTaP/Tdap/Td (3 - Td or Tdap) 03/15/2033   HPV VACCINES  Aged Out   PAP SMEAR-Modifier  Discontinued  Reports having colonoscopy this year at Dr [employer] office, negative. Mammogram at her gyn's office in 03/2022.  Anxiety and dysthymic disorder: She is currently taking Wellbutrin XL 300 mg daily and Cymbalta 60 mg daily, which she reports are still effective without any side effects.    03/16/2023    4:38 PM 08/09/2022    1:47 PM 05/10/2022   11:32 AM 06/08/2021   10:35 PM 12/10/2019    7:20 AM  Depression screen PHQ 2/9  Decreased Interest 0 0 0 0 0  Down, Depressed, Hopeless 0 0 0 0 0  PHQ - 2 Score 0 0 0 0 0  Altered sleeping 0 0 0 0 0  Tired, decreased  energy 0 0 0 3 0  Change in appetite 0 0 0 0 0  Feeling bad or failure about yourself  0 0 0 0 0  Trouble concentrating 0 0 0 0 0  Moving slowly or fidgety/restless 0 0 0 0   Suicidal thoughts 0 0 0 0 0  PHQ-9 Score 0 0 0 3 0  Difficult doing work/chores Not difficult at all Not difficult at all Not difficult at all Not difficult at all Not difficult at all      03/16/2023    4:38 PM 06/08/2021   10:36 PM 12/10/2019    7:20 AM  GAD 7 : Generalized Anxiety Score  Nervous, Anxious, on Edge 0 0 1  Control/stop worrying 0 0 0  Worry too much - different things 0 0 1  Trouble relaxing 0 0 0  Restless 0 0 0  Easily annoyed or irritable 0 1 1  Afraid - awful might happen 0 0 0  Total GAD 7 Score 0 1 3  Anxiety Difficulty Not difficult at all Not difficult at all Not difficult at all   Narcolepsy: Follows with neurologist annually.  07/2019 TC 188, LDL 93, TG 81,and HDL 80. She has no concerns today.  Review of Systems  Constitutional:  Negative for activity change, appetite change and fever.  HENT:  Negative for mouth sores, sore throat and trouble swallowing.   Eyes:  Negative for redness and  visual disturbance.  Respiratory:  Negative for cough, shortness of breath and wheezing.   Cardiovascular:  Negative for chest pain and leg swelling.  Gastrointestinal:  Negative for abdominal pain, nausea and vomiting.       No changes in bowel habits.  Endocrine: Negative for cold intolerance, heat intolerance, polydipsia, polyphagia and polyuria.  Genitourinary:  Negative for decreased urine volume, dysuria and hematuria.  Musculoskeletal:  Negative for gait problem and myalgias.  Skin:  Negative for color change and rash.  Allergic/Immunologic: Negative for environmental allergies.  Neurological:  Negative for syncope, weakness and headaches.  Hematological:  Negative for adenopathy. Does not bruise/bleed easily.  Psychiatric/Behavioral:  Negative for confusion and hallucinations.   All  other systems reviewed and are negative.  Current Outpatient Medications on File Prior to Visit  Medication Sig Dispense Refill   amphetamine-dextroamphetamine (ADDERALL) 10 MG tablet Daily at 11 AM. Along with 30 mg bid, separate Rx 30 tablet 0   amphetamine-dextroamphetamine (ADDERALL) 30 MG tablet Take 1 tablet by mouth 2 (two) times daily with a meal. 60 tablet 0   Sodium Oxybate (XYREM) 500 MG/ML SOLN Take 3.75gms for 1st nightly dose at bedtime and 3.75 gms for 2nd nightly dose 2.5-4 hours later.  (Total dose 7.5grams nightly) 450 mL 5   oseltamivir (TAMIFLU) 75 MG capsule Take 1 capsule (75 mg total) by mouth 2 (two) times daily. 10 capsule 0   No current facility-administered medications on file prior to visit.   Past Medical History:  Diagnosis Date   AC separation 09/2014   left shoulder   Anxiety    Carpal tunnel syndrome of right wrist    Complication of anesthesia    Dental crowns present    GERD (gastroesophageal reflux disease)    Gaviscon as needed   Migraines    Narcolepsy    PONV (postoperative nausea and vomiting)    nausea   Past Surgical History:  Procedure Laterality Date   ACROMIO-CLAVICULAR JOINT REPAIR Left 10/12/2014   Procedure: Left shoulder acromio-clavicular joint repair;  Surgeon: Mable Paris, MD;  Location: [facility];  Service: Orthopedics;  Laterality: Left;  Left shoulder acromio-clavicular joint repair   BUNIONECTOMY Bilateral    CESAREAN SECTION  10/14/2005; 08/23/2007   CYSTOSCOPY  07/26/2009   ENDOMETRIAL ABLATION  11/14/2002   EUS N/A 01/22/2014   Procedure: UPPER ENDOSCOPIC ULTRASOUND (EUS) LINEAR;  Surgeon: Rachael Fee, MD;  Location: [facility];  Service: Endoscopy;  Laterality: N/A;   LAPAROSCOPIC LYSIS OF ADHESIONS  04/30/2009   LAPAROSCOPIC OVARIAN CYSTECTOMY Left 11/14/2002   LAPAROSCOPIC SUPRACERVICAL HYSTERECTOMY  07/26/2009   LAPAROSCOPIC UNILATERAL SALPINGO OOPHERECTOMY Right 07/26/2009   TUBAL LIGATION   08/23/2007    Allergies  Allergen Reactions   Meclizine Hcl Other (See Comments)    SEVERE HEARTBURN   Morphine Itching   Percocet [Oxycodone-Acetaminophen] Itching    Family History  Problem Relation Age of Onset   Narcolepsy Sister    Kidney disease Sister    Prostate cancer Maternal Grandfather    Heart disease Paternal Grandfather     Social History   Socioeconomic History   Marital status: Married    Spouse name: [name]   Number of children: 2   Years of education: Bachelors   Highest education level: Bachelor's degree (e.g., BA, AB, BS)  Occupational History   Occupation: Child psychotherapist    Employer: [employer]  Tobacco Use   Smoking status: Former    Current packs/day: 0.00  Types: Cigarettes    Quit date: 08/13/2005    Years since quitting: 17.6   Smokeless tobacco: Never  Substance and Sexual Activity   Alcohol use: No    Alcohol/week: 0.0 standard drinks of alcohol   Drug use: No   Sexual activity: Not on file  Other Topics Concern   Not on file  Social History Narrative   Pt lives at home with husband [name] and two children   Pt is left handed    Education - Bachelors degree   Caffeine 1 cup a day   Social Determinants of Health   Financial Resource Strain: Patient Declined (05/10/2022)   Overall Financial Resource Strain (CARDIA)    Difficulty of Paying Living Expenses: Patient declined  Food Insecurity: Patient Declined (05/10/2022)   Hunger Vital Sign    Worried About Running Out of Food in the Last Year: Patient declined    Ran Out of Food in the Last Year: Patient declined  Transportation Needs: No Transportation Needs (05/10/2022)   PRAPARE - Administrator, Civil Service (Medical): No    Lack of Transportation (Non-Medical): No  Physical Activity: Unknown (05/10/2022)   Exercise Vital Sign    Days of Exercise per Week: Patient declined    Minutes of Exercise per Session: Not on file  Stress: No Stress Concern  Present (05/10/2022)   [employer] of Occupational Health - Occupational Stress Questionnaire    Feeling of Stress : Only a little  Social Connections: Unknown (05/10/2022)   Social Connection and Isolation Panel [NHANES]    Frequency of Communication with Friends and Family: Patient declined    Frequency of Social Gatherings with Friends and Family: Patient declined    Attends Religious Services: Patient declined    Database administrator or Organizations: Yes    Attends Banker Meetings: Patient declined    Marital Status: Married   Vitals:   03/16/23 1549  BP: 110/70  Pulse: 80  Resp: 12  Temp: 97.7 F (36.5 C)  SpO2: 99%   Body mass index is 25.86 kg/m.  Wt Readings from Last 3 Encounters:  03/16/23 132 lb 6.4 oz (60.1 kg)  11/20/22 128 lb 3.2 oz (58.2 kg)  08/09/22 133 lb (60.3 kg)   Physical Exam Vitals and nursing note reviewed.  Constitutional:      General: She is not in acute distress.    Appearance: She is well-developed.  HENT:     Head: Normocephalic and atraumatic.     Right Ear: Hearing, tympanic membrane, ear canal and external ear normal.     Left Ear: Hearing, tympanic membrane, ear canal and external ear normal.     Mouth/Throat:     Mouth: Mucous membranes are moist.     Pharynx: Oropharynx is clear. Uvula midline.  Eyes:     Extraocular Movements: Extraocular movements intact.     Conjunctiva/sclera: Conjunctivae normal.     Pupils: Pupils are equal, round, and reactive to light.  Neck:     Thyroid: No thyromegaly.     Trachea: No tracheal deviation.  Cardiovascular:     Rate and Rhythm: Normal rate and regular rhythm.     Pulses:          Dorsalis pedis pulses are 2+ on the right side and 2+ on the left side.     Heart sounds: No murmur heard. Pulmonary:     Effort: Pulmonary effort is normal. No respiratory distress.  Breath sounds: Normal breath sounds.  Abdominal:     Palpations: Abdomen is soft. There is no  hepatomegaly or mass.     Tenderness: There is no abdominal tenderness.  Genitourinary:    Comments: Deferred to gyn. Musculoskeletal:     Right lower leg: No edema.     Left lower leg: No edema.     Comments: No major deformity or signs of synovitis appreciated.  Lymphadenopathy:     Cervical: No cervical adenopathy.     Upper Body:     Right upper body: No supraclavicular adenopathy.     Left upper body: No supraclavicular adenopathy.  Skin:    General: Skin is warm.     Findings: No erythema or rash.  Neurological:     General: No focal deficit present.     Mental Status: She is alert and oriented to person, place, and time.     Cranial Nerves: No cranial nerve deficit.     Coordination: Coordination normal.     Gait: Gait normal.     Deep Tendon Reflexes:     Reflex Scores:      Bicep reflexes are 2+ on the right side and 2+ on the left side.      Patellar reflexes are 2+ on the right side and 2+ on the left side. Psychiatric:        Mood and Affect: Mood and affect normal.    ASSESSMENT AND PLAN: Ms. JENNEL MARA was here today annual physical examination and follow up.  Orders Placed This Encounter  Procedures   Tdap vaccine greater than or equal to 7yo IM   POC HgB A1c   Lab Results  Component Value Date   HGBA1C 5.1 03/16/2023   Routine general medical examination at a health care facility Assessment & Plan: We discussed the importance of regular physical activity and healthy diet for prevention of chronic illness and/or complications. Preventive guidelines reviewed. Vaccination : Due for Tdap this year, so given today. She had colonoscopy earlier this year at Dr. Kenna Gilbert office, I asked her to sign a release form, so we can obtain copy of report. Continue following with gynecology for her female preventive care. Next CPE in a year.   Generalized anxiety disorder Assessment & Plan: Reporting problem as stable. Continue Duloxetine 60 mg daily. Continue  annual follow-up, before if needed.  Orders: -     DULoxetine HCl; TAKE 1 CAPSULE BY MOUTH EVERY DAY  Dispense: 90 capsule; Refill: 3  Dysthymic disorder Assessment & Plan: Problem has been stable. Continue Wellbutrin XL 300 mg daily and duloxetine 60 mg daily. Continue annual follow-up, before if needed.  Orders: -     buPROPion HCl ER (XL); Take 1 tablet (300 mg total) by mouth daily.  Dispense: 90 tablet; Refill: 3 -     DULoxetine HCl; TAKE 1 CAPSULE BY MOUTH EVERY DAY  Dispense: 90 capsule; Refill: 3  Need for tetanus booster -     Tdap vaccine greater than or equal to 7yo IM  Diabetes mellitus screening -     POCT glycosylated hemoglobin (Hb A1C)   Return in 1 year (on 03/15/2024) for CPE, chronic problems. .  Shemeika Starzyk G. [place], MD  [facility]. [place] office.

## 2023-03-16 ENCOUNTER — Ambulatory Visit (INDEPENDENT_AMBULATORY_CARE_PROVIDER_SITE_OTHER): Payer: 59 | Admitting: Family Medicine

## 2023-03-16 ENCOUNTER — Encounter: Payer: Self-pay | Admitting: Family Medicine

## 2023-03-16 VITALS — BP 110/70 | HR 80 | Temp 97.7°F | Resp 12 | Ht 60.0 in | Wt 132.4 lb

## 2023-03-16 DIAGNOSIS — Z23 Encounter for immunization: Secondary | ICD-10-CM

## 2023-03-16 DIAGNOSIS — F341 Dysthymic disorder: Secondary | ICD-10-CM

## 2023-03-16 DIAGNOSIS — Z0000 Encounter for general adult medical examination without abnormal findings: Secondary | ICD-10-CM | POA: Diagnosis not present

## 2023-03-16 DIAGNOSIS — Z131 Encounter for screening for diabetes mellitus: Secondary | ICD-10-CM | POA: Diagnosis not present

## 2023-03-16 DIAGNOSIS — F411 Generalized anxiety disorder: Secondary | ICD-10-CM

## 2023-03-16 LAB — POCT GLYCOSYLATED HEMOGLOBIN (HGB A1C): Hemoglobin A1C: 5.1 % (ref 4.0–5.6)

## 2023-03-16 MED ORDER — BUPROPION HCL ER (XL) 300 MG PO TB24
300.0000 mg | ORAL_TABLET | Freq: Every day | ORAL | 3 refills | Status: DC
Start: 2023-03-16 — End: 2024-03-28

## 2023-03-16 MED ORDER — DULOXETINE HCL 60 MG PO CPEP
ORAL_CAPSULE | ORAL | 3 refills | Status: DC
Start: 2023-03-16 — End: 2024-03-28

## 2023-03-16 NOTE — Assessment & Plan Note (Addendum)
We discussed the importance of regular physical activity and healthy diet for prevention of chronic illness and/or complications. Preventive guidelines reviewed. Vaccination : Due for Tdap this year, so given today. She had colonoscopy earlier this year at Dr. Kenna Gilbert office, I asked her to sign a release form, so we can obtain copy of report. Continue following with gynecology for her female preventive care. Next CPE in a year.

## 2023-03-16 NOTE — Assessment & Plan Note (Signed)
Reporting problem as stable. Continue Duloxetine 60 mg daily. Continue annual follow-up, before if needed.

## 2023-03-16 NOTE — Assessment & Plan Note (Signed)
Problem has been stable. Continue Wellbutrin XL 300 mg daily and duloxetine 60 mg daily. Continue annual follow-up, before if needed.

## 2023-03-16 NOTE — Patient Instructions (Addendum)
A few things to remember from today's visit:  Routine general medical examination at a health care facility - Plan: POC HgB A1c  Screening for endocrine, metabolic and immunity disorder  Generalized anxiety disorder  Please sign a release form ,so we can obtain copy of your colonoscopy.  If you need refills for medications you take chronically, please call your pharmacy. Do not use My Chart to request refills or for acute issues that need immediate attention. If you send a my chart message, it may take a few days to be addressed, specially if I am not in the office.  Please be sure medication list is accurate. If a new problem present, please set up appointment sooner than planned today.  Health Maintenance, Female Adopting a healthy lifestyle and getting preventive care are important in promoting health and wellness. Ask your health care provider about: The right schedule for you to have regular tests and exams. Things you can do on your own to prevent diseases and keep yourself healthy. What should I know about diet, weight, and exercise? Eat a healthy diet  Eat a diet that includes plenty of vegetables, fruits, low-fat dairy products, and lean protein. Do not eat a lot of foods that are high in solid fats, added sugars, or sodium. Maintain a healthy weight Body mass index (BMI) is used to identify weight problems. It estimates body fat based on height and weight. Your health care provider can help determine your BMI and help you achieve or maintain a healthy weight. Get regular exercise Get regular exercise. This is one of the most important things you can do for your health. Most adults should: Exercise for at least 150 minutes each week. The exercise should increase your heart rate and make you sweat (moderate-intensity exercise). Do strengthening exercises at least twice a week. This is in addition to the moderate-intensity exercise. Spend less time sitting. Even light physical  activity can be beneficial. Watch cholesterol and blood lipids Have your blood tested for lipids and cholesterol at 50 years of age, then have this test every 5 years. Have your cholesterol levels checked more often if: Your lipid or cholesterol levels are high. You are older than 50 years of age. You are at high risk for heart disease. What should I know about cancer screening? Depending on your health history and family history, you may need to have cancer screening at various ages. This may include screening for: Breast cancer. Cervical cancer. Colorectal cancer. Skin cancer. Lung cancer. What should I know about heart disease, diabetes, and high blood pressure? Blood pressure and heart disease High blood pressure causes heart disease and increases the risk of stroke. This is more likely to develop in people who have high blood pressure readings or are overweight. Have your blood pressure checked: Every 3-5 years if you are 77-52 years of age. Every year if you are 53 years old or older. Diabetes Have regular diabetes screenings. This checks your fasting blood sugar level. Have the screening done: Once every three years after age 77 if you are at a normal weight and have a low risk for diabetes. More often and at a younger age if you are overweight or have a high risk for diabetes. What should I know about preventing infection? Hepatitis B If you have a higher risk for hepatitis B, you should be screened for this virus. Talk with your health care provider to find out if you are at risk for hepatitis B infection. Hepatitis C  Testing is recommended for: Everyone born from [birth year] through 1963. Anyone with known risk factors for hepatitis C. Sexually transmitted infections (STIs) Get screened for STIs, including gonorrhea and chlamydia, if: You are sexually active and are younger than 50 years of age. You are older than 50 years of age and your health care provider tells you that you  are at risk for this type of infection. Your sexual activity has changed since you were last screened, and you are at increased risk for chlamydia or gonorrhea. Ask your health care provider if you are at risk. Ask your health care provider about whether you are at high risk for HIV. Your health care provider may recommend a prescription medicine to help prevent HIV infection. If you choose to take medicine to prevent HIV, you should first get tested for HIV. You should then be tested every 3 months for as long as you are taking the medicine. Pregnancy If you are about to stop having your period (premenopausal) and you may become pregnant, seek counseling before you get pregnant. Take 400 to 800 micrograms (mcg) of folic acid every day if you become pregnant. Ask for birth control (contraception) if you want to prevent pregnancy. Osteoporosis and menopause Osteoporosis is a disease in which the bones lose minerals and strength with aging. This can result in bone fractures. If you are 21 years old or older, or if you are at risk for osteoporosis and fractures, ask your health care provider if you should: Be screened for bone loss. Take a calcium or vitamin D supplement to lower your risk of fractures. Be given hormone replacement therapy (HRT) to treat symptoms of menopause. Follow these instructions at home: Alcohol use Do not drink alcohol if: Your health care provider tells you not to drink. You are pregnant, may be pregnant, or are planning to become pregnant. If you drink alcohol: Limit how much you have to: 0-1 drink a day. Know how much alcohol is in your drink. In the [place] one drink equals one 12 oz bottle of beer (355 mL), one 5 oz glass of wine (148 mL), or one 1 oz glass of hard liquor (44 mL). Lifestyle Do not use any products that contain nicotine or tobacco. These products include cigarettes, chewing tobacco, and vaping devices, such as e-cigarettes. If you need help quitting,  ask your health care provider. Do not use street drugs. Do not share needles. Ask your health care provider for help if you need support or information about quitting drugs. General instructions Schedule regular health, dental, and eye exams. Stay current with your vaccines. Tell your health care provider if: You often feel depressed. You have ever been abused or do not feel safe at home. Summary Adopting a healthy lifestyle and getting preventive care are important in promoting health and wellness. Follow your health care provider's instructions about healthy diet, exercising, and getting tested or screened for diseases. Follow your health care provider's instructions on monitoring your cholesterol and blood pressure. This information is not intended to replace advice given to you by your health care provider. Make sure you discuss any questions you have with your health care provider. Document Revised: 12/20/2020 Document Reviewed: 12/20/2020 Elsevier Patient Education  2024 [employer].

## 2023-03-20 ENCOUNTER — Other Ambulatory Visit: Payer: Self-pay | Admitting: *Deleted

## 2023-03-20 MED ORDER — SODIUM OXYBATE 500 MG/ML PO SOLN: ORAL | 5 refills | Status: DC

## 2023-03-20 NOTE — Progress Notes (Signed)
Sodium oxybate prescription refill requested by [facility]. Prescription has been signed by Dr Vickey Huger on Dr Teofilo Pod behalf and faxed to [facility] Xyrem REMS program. Received a receipt of confirmation. [phone].

## 2023-03-21 ENCOUNTER — Telehealth: Payer: Self-pay | Admitting: Neurology

## 2023-03-21 NOTE — Telephone Encounter (Signed)
Cynthia [name] @ [facility] reports that the Rx sent over did not have the date on it. She is asking that they either be called and give a verbal or a new Rx can be faxed over to [phone]

## 2023-03-21 NOTE — Telephone Encounter (Signed)
I called [facility] back and entered my number to hold my place in line. [facility] is to call me back.

## 2023-04-03 ENCOUNTER — Encounter: Payer: Self-pay | Admitting: Gastroenterology

## 2023-04-03 MED ORDER — SODIUM OXYBATE 500 MG/ML PO SOLN: ORAL | 5 refills | Status: DC

## 2023-04-03 NOTE — Addendum Note (Signed)
Addended by: Bertram Savin on: 04/03/2023 01:42 PM   Modules accepted: Orders

## 2023-04-03 NOTE — Telephone Encounter (Signed)
[facility] Gabriel Rung) Request new script with Dr. Vickey Huger for Sodium Oxybate (XYREM) 500 MG/ML SOLN . Has one physician on prescription and another physician signed it.

## 2023-04-03 NOTE — Telephone Encounter (Signed)
Rx has been signed by Dr Frances Furbish and faxed back to [facility]. Received a receipt of confirmation. [phone]

## 2023-04-03 NOTE — Telephone Encounter (Signed)
Completed new prescription form. It's ready for Dr Teofilo Pod signature.

## 2023-04-03 NOTE — Telephone Encounter (Signed)
Dr Frances Furbish is back in the office now so we will just fill out a new one and have Dr Frances Furbish sign.

## 2023-04-09 LAB — HM MAMMOGRAPHY

## 2023-04-11 NOTE — Telephone Encounter (Signed)
Received fax that [facility] shipped Sodium Oxybate OS 03-29-2023 next shipment is 04-28-2023. PA expires 06/07/23.

## 2023-04-18 ENCOUNTER — Encounter: Payer: Self-pay | Admitting: Neurology

## 2023-04-18 DIAGNOSIS — G47411 Narcolepsy with cataplexy: Secondary | ICD-10-CM

## 2023-04-18 NOTE — Telephone Encounter (Signed)
Last visit: 11/20/22 Next visit 11/19/23  Last fills:

## 2023-04-19 MED ORDER — AMPHETAMINE-DEXTROAMPHETAMINE 10 MG PO TABS
ORAL_TABLET | ORAL | 0 refills | Status: DC
Start: 2023-04-19 — End: 2023-06-05

## 2023-04-19 MED ORDER — AMPHETAMINE-DEXTROAMPHETAMINE 30 MG PO TABS
30.0000 mg | ORAL_TABLET | Freq: Two times a day (BID) | ORAL | 0 refills | Status: DC
Start: 2023-04-19 — End: 2023-06-05

## 2023-05-08 NOTE — Telephone Encounter (Signed)
Received fax that [facility] shipped Sodium Oxybate OS 04-30-2023 next shipment is 05-28-2023. PA expires 06/07/23.

## 2023-05-29 ENCOUNTER — Other Ambulatory Visit (HOSPITAL_COMMUNITY): Payer: Self-pay

## 2023-05-30 ENCOUNTER — Other Ambulatory Visit (HOSPITAL_COMMUNITY): Payer: Self-pay

## 2023-05-30 ENCOUNTER — Telehealth: Payer: Self-pay

## 2023-05-30 ENCOUNTER — Telehealth: Payer: Self-pay | Admitting: Neurology

## 2023-05-30 NOTE — Telephone Encounter (Signed)
Dominique @ [facility] reports that the current pa on file for Sodium Oxybate (XYREM) 500 MG/ML SOLN  is about to expire next week(10/24) she is requesting a new one.[phone]  Option 3 then option 3 hours

## 2023-05-30 NOTE — Telephone Encounter (Signed)
Pharmacy Patient Advocate Encounter   Received notification from Physician's Office that prior authorization for Sodium Oxybate 500MG /ML solution is required/requested.   Insurance verification completed.   The patient is insured through [facility] .   Per test claim: PA required; PA submitted to [facility] via CoverMyMeds Key/confirmation #/EOC [id number] Status is pending

## 2023-05-30 NOTE — Telephone Encounter (Signed)
PA request has been Submitted. New Encounter created for follow up. For additional info see Pharmacy Prior Auth telephone encounter from 05/30/2023.

## 2023-06-01 ENCOUNTER — Other Ambulatory Visit (HOSPITAL_COMMUNITY): Payer: Self-pay

## 2023-06-01 NOTE — Telephone Encounter (Signed)
Pharmacy Patient Advocate Encounter  Received notification from [facility] that Prior Authorization for Sodium Oxybate 500MG /ML solution has been APPROVED from 05/30/2023 to 05/29/2024   PA #/Case ID/Reference #: PA Case ID #: [id number]

## 2023-06-04 ENCOUNTER — Encounter: Payer: Self-pay | Admitting: Neurology

## 2023-06-04 DIAGNOSIS — G47411 Narcolepsy with cataplexy: Secondary | ICD-10-CM

## 2023-06-05 MED ORDER — AMPHETAMINE-DEXTROAMPHETAMINE 30 MG PO TABS
30.0000 mg | ORAL_TABLET | Freq: Two times a day (BID) | ORAL | 0 refills | Status: DC
Start: 2023-06-05 — End: 2023-07-31

## 2023-06-05 MED ORDER — AMPHETAMINE-DEXTROAMPHETAMINE 10 MG PO TABS
ORAL_TABLET | ORAL | 0 refills | Status: DC
Start: 2023-06-05 — End: 2023-07-31

## 2023-06-05 NOTE — Telephone Encounter (Signed)
Last visit: 11/20/22 Next visit 11/19/23     Per adherence report on Epic, last filled

## 2023-06-20 NOTE — Telephone Encounter (Signed)
Received [facility] xyrem (sodium oxybate) shipped 05/28/2023 next shipment date 06/27/2023.  PA expiration date 05/29/2024. [phone], fax [phone].

## 2023-07-02 NOTE — Telephone Encounter (Signed)
Received fax that [facility] pharmacy shipped Sodium Oxybate OS 06-27-2023 next shipment is 07-27-2023. PA expires 05/29/24.

## 2023-07-30 ENCOUNTER — Encounter: Payer: Self-pay | Admitting: Neurology

## 2023-07-30 DIAGNOSIS — G47411 Narcolepsy with cataplexy: Secondary | ICD-10-CM

## 2023-07-31 MED ORDER — AMPHETAMINE-DEXTROAMPHETAMINE 30 MG PO TABS: 30.0000 mg | ORAL_TABLET | Freq: Two times a day (BID) | ORAL | 0 refills | Status: DC

## 2023-07-31 MED ORDER — AMPHETAMINE-DEXTROAMPHETAMINE 10 MG PO TABS: ORAL_TABLET | ORAL | 0 refills | Status: DC

## 2023-07-31 NOTE — Telephone Encounter (Signed)
Last visit: 11/20/22  Next visit: 11/19/23  Per epic registry, last fill:    Rx refills sent to Dr Frances Furbish to approve.

## 2023-08-17 ENCOUNTER — Encounter: Payer: Self-pay | Admitting: Family Medicine

## 2023-08-17 ENCOUNTER — Ambulatory Visit: Payer: 59 | Admitting: Family Medicine

## 2023-08-17 VITALS — BP 120/64 | HR 88 | Temp 98.3°F | Ht 60.0 in | Wt 142.8 lb

## 2023-08-17 DIAGNOSIS — R300 Dysuria: Secondary | ICD-10-CM | POA: Diagnosis not present

## 2023-08-17 DIAGNOSIS — N390 Urinary tract infection, site not specified: Secondary | ICD-10-CM | POA: Diagnosis not present

## 2023-08-17 LAB — POC URINALSYSI DIPSTICK (AUTOMATED)
Bilirubin, UA: NEGATIVE
Glucose, UA: NEGATIVE
Ketones, UA: NEGATIVE
Nitrite, UA: NEGATIVE
Protein, UA: POSITIVE — AB
Spec Grav, UA: 1.01 (ref 1.010–1.025)
Urobilinogen, UA: 1 U/dL
pH, UA: 8.5 — AB (ref 5.0–8.0)

## 2023-08-17 MED ORDER — NITROFURANTOIN MONOHYD MACRO 100 MG PO CAPS: 100.0000 mg | ORAL_CAPSULE | Freq: Two times a day (BID) | ORAL | 0 refills | Status: DC

## 2023-08-17 NOTE — Progress Notes (Signed)
 Established Patient Office Visit  Subjective   Patient ID: Cynthia [name], female    DOB: 1970/09/29  Age: 53 y.o. MRN: [id number]  Chief Complaint  Patient presents with   Urinary Tract Infection    Started a day ago, burning, blood, stomach pain    HPI   Cynthia [name] is seen today with concern for possible UTI.  Onset this morning of some urine frequency and burning with urination.  No fever.  No flank pain.  No nausea or vomiting.  She notes a little bit of blood in her urine.  Has had previous hysterectomy.  Last UTI apparently September 2023 with E. coli.  She had UTI prior to that 2014 with Proteus mirabilis.  Past Medical History:  Diagnosis Date   AC separation 09/2014   left shoulder   Anxiety    Carpal tunnel syndrome of right wrist    Complication of anesthesia    Dental crowns present    GERD (gastroesophageal reflux disease)    Gaviscon as needed   Migraines    Narcolepsy    PONV (postoperative nausea and vomiting)    nausea   Past Surgical History:  Procedure Laterality Date   ACROMIO-CLAVICULAR JOINT REPAIR Left 10/12/2014   Procedure: Left shoulder acromio-clavicular joint repair;  Surgeon: Eva Elsie Herring, MD;  Location: [facility];  Service: Orthopedics;  Laterality: Left;  Left shoulder acromio-clavicular joint repair   BUNIONECTOMY Bilateral    CESAREAN SECTION  10/14/2005; 08/23/2007   CYSTOSCOPY  07/26/2009   ENDOMETRIAL ABLATION  11/14/2002   EUS N/A 01/22/2014   Procedure: UPPER ENDOSCOPIC ULTRASOUND (EUS) LINEAR;  Surgeon: Toribio SHAUNNA Cedar, MD;  Location: [facility];  Service: Endoscopy;  Laterality: N/A;   LAPAROSCOPIC LYSIS OF ADHESIONS  04/30/2009   LAPAROSCOPIC OVARIAN CYSTECTOMY Left 11/14/2002   LAPAROSCOPIC SUPRACERVICAL HYSTERECTOMY  07/26/2009   LAPAROSCOPIC UNILATERAL SALPINGO OOPHERECTOMY Right 07/26/2009   TUBAL LIGATION  08/23/2007    reports that she quit smoking about 18 years ago. Her smoking use included cigarettes. She  has never used smokeless tobacco. She reports that she does not drink alcohol and does not use drugs. family history includes Heart disease in her paternal grandfather; Kidney disease in her sister; Narcolepsy in her sister; Prostate cancer in her maternal grandfather. Allergies  Allergen Reactions   Meclizine Hcl Other (See Comments)    SEVERE HEARTBURN   Morphine Itching   Percocet [Oxycodone -Acetaminophen ] Itching    Review of Systems  Constitutional:  Negative for chills and fever.  Gastrointestinal:  Negative for nausea and vomiting.  Genitourinary:  Positive for dysuria and frequency. Negative for flank pain.      Objective:     BP 120/64 (BP Location: Right Arm, Patient Position: Sitting, Cuff Size: Normal)   Pulse 88   Temp 98.3 F (36.8 C) (Oral)   Ht 5' (1.524 m)   Wt 142 lb 12.8 oz (64.8 kg)   SpO2 97%   BMI 27.89 kg/m  BP Readings from Last 3 Encounters:  08/17/23 120/64  03/16/23 110/70  11/20/22 94/61   Wt Readings from Last 3 Encounters:  08/17/23 142 lb 12.8 oz (64.8 kg)  03/16/23 132 lb 6.4 oz (60.1 kg)  11/20/22 128 lb 3.2 oz (58.2 kg)      Physical Exam Vitals reviewed.  Constitutional:      General: She is not in acute distress.    Appearance: She is not ill-appearing or toxic-appearing.  Cardiovascular:     Rate and Rhythm:  Normal rate and regular rhythm.  Pulmonary:     Effort: Pulmonary effort is normal.     Breath sounds: Normal breath sounds. No rales.      Results for orders placed or performed in visit on 08/17/23  POCT Urinalysis Dipstick (Automated)  Result Value Ref Range   Color, UA yellow    Clarity, UA clear    Glucose, UA Negative Negative   Bilirubin, UA neg    Ketones, UA neg    Spec Grav, UA 1.010 1.010 - 1.025   Blood, UA 1+    pH, UA 8.5 (A) 5.0 - 8.0   Protein, UA Positive (A) Negative   Urobilinogen, UA 1.0 0.2 or 1.0 E.U./dL   Nitrite, UA neg    Leukocytes, UA Small (1+) (A) Negative      The ASCVD  Risk score (Arnett DK, et al., 2019) failed to calculate for the following reasons:   Cannot find a previous HDL lab   Cannot find a previous total cholesterol lab    Assessment & Plan:   One day history of dysuria.  Suspect uncomplicated cystitis.  Urine dipstick reveals leukocytes and blood. -Send urine culture -Start Macrobid  1 twice daily for 5 days -Plenty of fluids -Follow-up for any persistent or worsening symptoms   No follow-ups on file.    Wolm Scarlet, MD

## 2023-08-19 LAB — URINE CULTURE
MICRO NUMBER:: 15915068
SPECIMEN QUALITY:: ADEQUATE

## 2023-09-04 ENCOUNTER — Other Ambulatory Visit: Payer: Self-pay

## 2023-09-04 MED ORDER — SODIUM OXYBATE 500 MG/ML PO SOLN: ORAL | 5 refills | Status: DC

## 2023-09-05 ENCOUNTER — Telehealth: Payer: Self-pay

## 2023-09-05 NOTE — Telephone Encounter (Signed)
Patient Xyrem order request has been faxed and confirmed.

## 2023-09-06 NOTE — Telephone Encounter (Signed)
Received EDS xyrem (sodium oxybate) shipped 08/07/2023 next shipment date  08/26/2023.  PA expiration date 05/29/2024. [phone], fax [phone].

## 2023-09-19 ENCOUNTER — Encounter: Payer: Self-pay | Admitting: Neurology

## 2023-09-19 DIAGNOSIS — G47411 Narcolepsy with cataplexy: Secondary | ICD-10-CM

## 2023-09-19 MED ORDER — AMPHETAMINE-DEXTROAMPHETAMINE 10 MG PO TABS: ORAL_TABLET | ORAL | 0 refills | Status: DC

## 2023-09-19 MED ORDER — AMPHETAMINE-DEXTROAMPHETAMINE 30 MG PO TABS: 30.0000 mg | ORAL_TABLET | Freq: Two times a day (BID) | ORAL | 0 refills | Status: DC

## 2023-09-19 NOTE — Telephone Encounter (Signed)
 Last visit: 11/20/22  Next visit: 11/19/23  Per [facility] registry, last fill:    Rx refills sent to Dr Omar Bibber to approve

## 2023-10-08 NOTE — Telephone Encounter (Signed)
 Received fax concerning [facility] CASE ID [id number] optum RX PA expiration 05/29/2024 last shipped 10/01/2023 next scheduled ship date 10/30/2023 Sodium Oxybate 7.5 grams ESSDS [phone], fx [phone]

## 2023-10-22 ENCOUNTER — Encounter: Payer: Self-pay | Admitting: Neurology

## 2023-10-22 DIAGNOSIS — G47411 Narcolepsy with cataplexy: Secondary | ICD-10-CM

## 2023-10-23 ENCOUNTER — Ambulatory Visit (INDEPENDENT_AMBULATORY_CARE_PROVIDER_SITE_OTHER)

## 2023-10-23 ENCOUNTER — Ambulatory Visit: Admitting: Family Medicine

## 2023-10-23 ENCOUNTER — Encounter: Payer: Self-pay | Admitting: Family Medicine

## 2023-10-23 VITALS — BP 120/78 | HR 90 | Temp 98.3°F | Resp 12 | Ht 60.0 in | Wt 137.0 lb

## 2023-10-23 DIAGNOSIS — R062 Wheezing: Secondary | ICD-10-CM | POA: Diagnosis not present

## 2023-10-23 DIAGNOSIS — R053 Chronic cough: Secondary | ICD-10-CM

## 2023-10-23 DIAGNOSIS — K219 Gastro-esophageal reflux disease without esophagitis: Secondary | ICD-10-CM | POA: Diagnosis not present

## 2023-10-23 MED ORDER — AMPHETAMINE-DEXTROAMPHETAMINE 30 MG PO TABS: 30.0000 mg | ORAL_TABLET | Freq: Two times a day (BID) | ORAL | 0 refills | Status: DC

## 2023-10-23 MED ORDER — BENZONATATE 100 MG PO CAPS: 100.0000 mg | ORAL_CAPSULE | Freq: Two times a day (BID) | ORAL | 0 refills | Status: AC | PRN

## 2023-10-23 MED ORDER — AMPHETAMINE-DEXTROAMPHETAMINE 10 MG PO TABS: ORAL_TABLET | ORAL | 0 refills | Status: DC

## 2023-10-23 MED ORDER — ALBUTEROL SULFATE HFA 108 (90 BASE) MCG/ACT IN AERS: 2.0000 | INHALATION_SPRAY | Freq: Four times a day (QID) | RESPIRATORY_TRACT | 0 refills | Status: DC | PRN

## 2023-10-23 NOTE — Progress Notes (Signed)
 ACUTE VISIT Chief Complaint  Patient presents with   Cough    X 3 weeks, having some chest congestion and sore throat.    HPI: Cynthia [name] is a 53 y.o. female with a PMHx significant for neurodermatitis, vitamin D deficiency, anxiety, and narcolepsy, who is here today complaining of congestion and dry cough as described above.    She complains of coughing and chest congestion and tightness for three weeks. She also developed a sore throat yesterday.  Cough This is a new problem. The current episode started 1 to 4 weeks ago. The problem has been gradually worsening. The problem occurs every few hours. The cough is Non-productive. Associated symptoms include heartburn. Pertinent negatives include no ear congestion, ear pain, eye redness, headaches, hemoptysis, myalgias, rash, sweats or weight loss. Nothing aggravates the symptoms. She has tried OTC cough suppressant for the symptoms. There is no history of COPD or environmental allergies.   Further endorses some wheezing and SOB while coughing, as well as heartburn.  She has not identified exacerbating or alleviating factors.  GERD: She is not on pharmacologic treatment.  No hx of asthma or allergies. She smoked until 2006.  She believes the cough is continuing to worsening.  She has been taking alka-seltzer daytime cold and flu. She has used inhalers for bronchitis many years ago.  Pertinent negatives include fever, chills, nausea, abdominal pain, or known sick contacts.   Review of Systems  Constitutional:  Negative for activity change, appetite change and weight loss.  HENT:  Negative for ear pain, mouth sores and trouble swallowing.   Eyes:  Negative for discharge and redness.  Respiratory:  Positive for cough. Negative for hemoptysis.   Gastrointestinal:  Positive for heartburn. Negative for abdominal pain, nausea and vomiting.  Genitourinary:  Negative for decreased urine volume and hematuria.  Musculoskeletal:  Negative  for myalgias.  Skin:  Negative for rash.  Allergic/Immunologic: Negative for environmental allergies.  Neurological:  Negative for headaches.  See other pertinent positives and negatives in HPI.  Current Outpatient Medications on File Prior to Visit  Medication Sig Dispense Refill   buPROPion (WELLBUTRIN XL) 300 MG 24 hr tablet Take 1 tablet (300 mg total) by mouth daily. 90 tablet 3   DULoxetine (CYMBALTA) 60 MG capsule TAKE 1 CAPSULE BY MOUTH EVERY DAY 90 capsule 3   linaclotide (LINZESS) 72 MCG capsule Take 72 mcg by mouth daily before breakfast.     Sodium Oxybate (XYREM) 500 MG/ML SOLN Take 3.75gms for 1st nightly dose at bedtime and 3.75 gms for 2nd nightly dose 2.5-4 hours later.  (Total dose 7.5grams nightly) 450 mL 5   amphetamine-dextroamphetamine (ADDERALL) 10 MG tablet Daily at 11 AM. Along with 30 mg bid, separate Rx 30 tablet 0   amphetamine-dextroamphetamine (ADDERALL) 30 MG tablet Take 1 tablet by mouth 2 (two) times daily with a meal. 60 tablet 0   No current facility-administered medications on file prior to visit.    Past Medical History:  Diagnosis Date   AC separation 09/2014   left shoulder   Anxiety    Carpal tunnel syndrome of right wrist    Complication of anesthesia    Dental crowns present    GERD (gastroesophageal reflux disease)    Gaviscon as needed   Migraines    Narcolepsy    PONV (postoperative nausea and vomiting)    nausea   Allergies  Allergen Reactions   Meclizine Hcl Other (See Comments)    SEVERE HEARTBURN  Morphine Itching   Percocet [Oxycodone-Acetaminophen] Itching    Social History   Socioeconomic History   Marital status: Married    Spouse name: [name]   Number of children: 2   Years of education: Bachelors   Highest education level: Bachelor's degree (e.g., BA, AB, BS)  Occupational History   Occupation: Child psychotherapist    Employer: [employer]  Tobacco Use   Smoking status: Former    Types: Cigarettes    Quit  date: 08/13/2005    Years since quitting: 18.2   Smokeless tobacco: Never  Substance and Sexual Activity   Alcohol use: No    Alcohol/week: 0.0 standard drinks of alcohol   Drug use: No   Sexual activity: Not on file  Other Topics Concern   Not on file  Social History Narrative   Pt lives at home with husband [name] and two children   Pt is left handed    Education - Bachelors degree   Caffeine 1 cup a day   Social Drivers of Corporate investment banker Strain: Low Risk  (10/22/2023)   Overall Financial Resource Strain (CARDIA)    Difficulty of Paying Living Expenses: Not hard at all  Food Insecurity: No Food Insecurity (10/22/2023)   Hunger Vital Sign    Worried About Running Out of Food in the Last Year: Never true    Ran Out of Food in the Last Year: Never true  Transportation Needs: No Transportation Needs (10/22/2023)   PRAPARE - Administrator, Civil Service (Medical): No    Lack of Transportation (Non-Medical): No  Physical Activity: Unknown (10/22/2023)   Exercise Vital Sign    Days of Exercise per Week: 0 days    Minutes of Exercise per Session: Not on file  Stress: No Stress Concern Present (10/22/2023)   [employer] of Occupational Health - Occupational Stress Questionnaire    Feeling of Stress : Only a little  Social Connections: Unknown (10/22/2023)   Social Connection and Isolation Panel [NHANES]    Frequency of Communication with Friends and Family: Once a week    Frequency of Social Gatherings with Friends and Family: Patient declined    Attends Religious Services: More than 4 times per year    Active Member of [employer] or Organizations: No    Attends Banker Meetings: Not on file    Marital Status: Married    Vitals:   10/23/23 1516  BP: 120/78  Pulse: 90  Resp: 12  Temp: 98.3 F (36.8 C)  SpO2: 97%   Body mass index is 26.76 kg/m.  Physical Exam Vitals and nursing note reviewed.  Constitutional:       General: She is not in acute distress.    Appearance: She is well-developed. She is not ill-appearing.  HENT:     Head: Normocephalic and atraumatic.     Right Ear: Tympanic membrane, ear canal and external ear normal.     Left Ear: Tympanic membrane, ear canal and external ear normal.     Nose:     Right Sinus: No maxillary sinus tenderness or frontal sinus tenderness.     Left Sinus: No maxillary sinus tenderness or frontal sinus tenderness.     Mouth/Throat:     Mouth: Mucous membranes are moist.     Pharynx: Oropharynx is clear. Uvula midline. No oropharyngeal exudate or posterior oropharyngeal erythema.  Eyes:     Conjunctiva/sclera: Conjunctivae normal.  Cardiovascular:     Rate  and Rhythm: Normal rate and regular rhythm.     Heart sounds: No murmur heard. Pulmonary:     Effort: Pulmonary effort is normal. No respiratory distress.     Breath sounds: Normal breath sounds. No stridor.  Lymphadenopathy:     Head:     Right side of head: No submandibular adenopathy.     Left side of head: No submandibular adenopathy.     Cervical: No cervical adenopathy.  Skin:    General: Skin is warm.     Findings: No erythema or rash.  Neurological:     Mental Status: She is alert and oriented to person, place, and time.  Psychiatric:        Mood and Affect: Mood and affect normal.   ASSESSMENT AND PLAN:  Cynthia [name] was seen today for congestion and cough.   Persistent cough for 3 weeks or longer We discussed possible etiologies, including post viral, allergies,and GERD. Lung auscultation negative. Benzonatate recommended for symptomatic treatment. Further recommendations according to CXR result.  -     DG Chest 2 View; Future -     Benzonatate; Take 1 capsule (100 mg total) by mouth 2 (two) times daily as needed for up to 10 days.  Dispense: 20 capsule; Refill: 0  Wheezing Not present at this time. Albuterol inh 2 puff every 6 hours for a week then as needed for wheezing or  shortness of breath.  I do not think Prednisone is necessary at this time. Instructed about warning signs.  -     Albuterol Sulfate HFA; Inhale 2 puffs into the lungs every 6 (six) hours as needed for wheezing or shortness of breath.  Dispense: 8 g; Refill: 0  Gastroesophageal reflux disease, unspecified whether esophagitis present For now she is not interested in trying PPI but needs to be considered if cough doe snot resolved. Continue GERD precautions.  Return if symptoms worsen or fail to improve, for keep next appointment.  I, Rolla Etienne Wierda, acting as a scribe for Martell Mcfadyen [place], MD., have documented all relevant documentation on the behalf of Dennise Bamber [place], MD, as directed by  Nathanyl Andujo [place], MD while in the presence of Jahron Hunsinger [place], MD.   I, Lysha Schrade [place], MD, have reviewed all documentation for this visit. The documentation on 10/23/23 for the exam, diagnosis, procedures, and orders are all accurate and complete.  Maevis Mumby G. [place], MD  [facility]. [place] office.

## 2023-10-23 NOTE — Patient Instructions (Addendum)
 A few things to remember from today's visit:  Persistent cough for 3 weeks or longer - Plan: DG Chest 2 View, benzonatate (TESSALON) 100 MG capsule  Wheezing - Plan: albuterol (VENTOLIN HFA) 108 (90 Base) MCG/ACT inhaler  Gastroesophageal reflux disease, unspecified whether esophagitis present Albuterol inh 2 puff every 6 hours for a week then as needed for wheezing or shortness of breath.  If cough is not better in 10 days we could try medication for acid reflux.  If you need refills for medications you take chronically, please call your pharmacy. Do not use My Chart to request refills or for acute issues that need immediate attention. If you send a my chart message, it may take a few days to be addressed, specially if I am not in the office.  Please be sure medication list is accurate. If a new problem present, please set up appointment sooner than planned today.

## 2023-10-23 NOTE — Telephone Encounter (Signed)
 Last visit: 11/20/22  Next visit:11/19/23  Per epic registry, last fill:   Rx refills sent to Dr Frances Furbish to e-scribe

## 2023-10-25 ENCOUNTER — Encounter: Payer: Self-pay | Admitting: Family Medicine

## 2023-10-27 ENCOUNTER — Encounter: Payer: Self-pay | Admitting: Family Medicine

## 2023-11-05 NOTE — Telephone Encounter (Signed)
 Received fax concerning [facility] CASE ID [id number] optum RX PA expiration 05/29/2024 last shipped 10/30/2023 next scheduled ship date 11/29/2023 Sodium Oxybate 7.5 grams ESSDS [phone], fx [phone]

## 2023-11-19 ENCOUNTER — Encounter: Payer: Self-pay | Admitting: Neurology

## 2023-11-19 ENCOUNTER — Ambulatory Visit: Payer: 59 | Admitting: Neurology

## 2023-11-19 VITALS — BP 101/67 | HR 91 | Ht 60.0 in | Wt 141.2 lb

## 2023-11-19 DIAGNOSIS — G47411 Narcolepsy with cataplexy: Secondary | ICD-10-CM | POA: Diagnosis not present

## 2023-11-19 MED ORDER — AMPHETAMINE-DEXTROAMPHETAMINE 10 MG PO TABS: ORAL_TABLET | ORAL | 0 refills | Status: DC

## 2023-11-19 MED ORDER — AMPHETAMINE-DEXTROAMPHETAMINE 30 MG PO TABS: 30.0000 mg | ORAL_TABLET | Freq: Two times a day (BID) | ORAL | 0 refills | Status: DC

## 2023-11-19 NOTE — Telephone Encounter (Signed)
Patient completed appointment

## 2023-11-19 NOTE — Telephone Encounter (Signed)
 Received fax concerning [facility] CASE ID [id number] optum RX PA expiration 05/29/2024 last shipped 10/30/2023 next scheduled ship date 11/29/2023 Sodium Oxybate 7.5 grams ESSDS [phone], fx [phone]

## 2023-11-19 NOTE — Progress Notes (Signed)
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 meal.   buPROPion (WELLBUTRIN XL) 300 MG 24 hr tablet Take 1 tablet (300 mg total) by mouth daily.   DULoxetine (CYMBALTA) 60 MG capsule TAKE 1 CAPSULE BY MOUTH EVERY DAY   linaclotide (LINZESS) 72 MCG capsule Take 72 mcg by mouth daily before breakfast.   Sodium Oxybate (XYREM) 500 MG/ML SOLN Take 3.75gms for 1st nightly dose at bedtime and 3.75 gms for 2nd nightly dose 2.5-4 hours later.  (Total dose 7.5grams nightly)   No facility-administered encounter medications on file as of 11/19/2023.  :  Review of Systems:  Out of a complete 14 point review of systems, all are reviewed and negative with the exception of these symptoms as listed below:   Review of Systems  Neurological:        Doing well.  ESS 1, FSS 13.     Objective:  Neurological Exam  Physical Exam Physical Examination:   Vitals:   11/19/23 0736  BP: 101/67  Pulse: 91    General Examination: The patient is a very pleasant 53 y.o. female in no acute distress. She appears well-developed and well-nourished and well groomed.   HEENT: Normocephalic, atraumatic, pupils are equal, round and reactive to light. Extraocular tracking is well-preserved, no obvious nystagmus, hearing grossly intact.  Face is  symmetric with normal facial animation.  Speech is clear without dysarthria, hypophonia or voice tremor.  Neck with full range of motion.  No carotid bruits.  Mild mouth dryness noted.  Tongue protrudes centrally and palate elevates symmetrically.   Chest: Clear to auscultation without wheezing, rhonchi or crackles noted.   Heart: S1+S2+0, regular and normal without murmurs, rubs or gallops noted.    Abdomen:  Soft, non-tender and non-distended.   Extremities: There is no obvious edema.   Skin: Warm and dry without trophic changes noted.     Musculoskeletal: exam reveals no obvious joint deformities.    Neurologically:  Mental status: The patient is awake, alert and oriented in all 4 spheres. Her immediate and remote memory, attention, language skills and fund of knowledge are appropriate. There is no evidence of aphasia, agnosia, apraxia or anomia. Speech is clear with normal prosody and enunciation. Thought process is linear. Mood is normal and affect is normal.  Cranial nerves II - XII are as described above under HEENT exam. Motor exam: Normal bulk, strength and tone is noted. There is no obvious resting or action tremor.   Fine motor skills and coordination: grossly intact.  Cerebellar testing: No dysmetria or intention tremor.  Sensory exam: intact to light touch in the upper and lower extremities.  Gait, station and balance: She stands easily. No veering to one side is noted. No leaning to one side is noted. Posture is age-appropriate and stance is narrow based. Gait shows normal stride length and normal pace. No problems turning are noted.    Assessment and Plan:  In summary, ROBERTHA STAPLES is a very pleasant 53 year old female with an underlying medical history of anxiety, ADD and recurrent headaches, who presents for followup consultation of her narcolepsy with cataplexy (by history). Cataplexy has not been a symptom in quite some time, thankfully. She has been doing well on Xyrem  with good compliance, currently 3.75 g twice nightly, typically around 9 PM and midnight. Over time, she has tried Adderall XR, Adderall IR, Ritalin, Nuvigil and Provigil. She has done well with Xyrem.  She takes it only once a night when she is on call, which is infrequent.  The patient has experienced receding gum lines and had needed surgery for this. We increased her Xyrem in 12/17. Furthermore, we can consider increasing this in the future for a max dose of 9 g for the night, if needed. There is a low sodium formulation available as well, which we discussed and there is a once daily formulation now as well but we mutually agreed to continue with her current medication as she has done well with that and we mutually agreed not to rock the boat.   She has been taking it on a set schedule and has been reminded to take it only when ready in bed.  She is currently on a total dose of 7.5 g nightly. She has been able to tolerate her immediate release Adderall and the current regimen works well for her, Adderall 30 mg bid + 10 mg daily for a total of 70 mg daily.  Of note, she had interim sleep study testing and office visits through [facility] for second opinion and was cleared fully for driving and using the county car.  She did not need any refills on her medications today.  She is advised to follow-up routinely in this clinic in 1 year as she is doing well.  She is advised to call or email [place] through MyChart with any interim questions or concerns.  I answered all her questions today and she was in agreement.

## 2023-12-05 NOTE — Telephone Encounter (Signed)
 Received Sodium Oxybate  [facility] rx last shipped 11-29-2023, next ship date 12-29-2023 7.5gm nightly dose.  PA exp date 05-29-2024.  Case ID [id number]. [phone], fx [phone].

## 2024-01-08 NOTE — Telephone Encounter (Signed)
 Received Sodium Oxybate  [facility] rx last shipped 12-31-2023, next ship date 01-28-2024 7.5gm nightly dose. PA exp date 05-29-2024. Case ID [id number]. [phone], fx [phone]

## 2024-01-22 ENCOUNTER — Encounter: Payer: Self-pay | Admitting: Neurology

## 2024-01-22 DIAGNOSIS — G47411 Narcolepsy with cataplexy: Secondary | ICD-10-CM

## 2024-01-22 MED ORDER — AMPHETAMINE-DEXTROAMPHETAMINE 10 MG PO TABS: ORAL_TABLET | ORAL | 0 refills | Status: DC

## 2024-01-22 MED ORDER — AMPHETAMINE-DEXTROAMPHETAMINE 30 MG PO TABS: 30.0000 mg | ORAL_TABLET | Freq: Two times a day (BID) | ORAL | 0 refills | Status: DC

## 2024-01-22 NOTE — Telephone Encounter (Signed)
 Last appt: 11/19/23 Next appt: 11/20/24 Last fills:  Rx refill request sent to Dr Janett Medin (work-in) as Dr Omar Bibber is out of the office.

## 2024-01-31 ENCOUNTER — Other Ambulatory Visit: Payer: Self-pay | Admitting: *Deleted

## 2024-01-31 MED ORDER — SODIUM OXYBATE 500 MG/ML PO SOLN: ORAL | 5 refills | Status: DC

## 2024-01-31 NOTE — Progress Notes (Signed)
 Received renewal request by fax from [facility].  Last fill 01-29-2024.   Last OV 4-02/2024, next appt 11-20-2024.

## 2024-02-06 ENCOUNTER — Telehealth: Payer: Self-pay | Admitting: Neurology

## 2024-02-06 NOTE — Telephone Encounter (Signed)
 XYREM  form needs to be faxed to [facility] fax # [phone] ph#[phone] option 3 then option 4

## 2024-02-06 NOTE — Telephone Encounter (Signed)
 I completed form to Dr. Buck for signature.

## 2024-02-12 NOTE — Telephone Encounter (Signed)
 Refaxed the  order form for sodium oxybate .  Confirmed.

## 2024-02-20 NOTE — Telephone Encounter (Signed)
 Received fax from [facility] that received order for pt (transitioning to xywav).   Noted that pt is on sodium oxybate   (xyrem ).  Reviewing chart yes pt on xyrem  (sodium oxybate ).  Called [facility] spoke to Morna DEL, pharmacist.  Pt received her prescription 01-29-2024 for sodium oxybate , her last rx due 02-27-2024, from previous prescription.  Cancelled the xywav, and resubmitted for XYREM  (sodium oxybate ).

## 2024-02-26 NOTE — Telephone Encounter (Signed)
 Received Sodium Oxybate  [facility] rx last shipped 01-29-2024, next ship date 02-27-2024 7.5gm nightly dose. PA exp date 05-29-2024. Case ID [id number]. [phone], fx [phone].

## 2024-02-29 ENCOUNTER — Encounter: Payer: Self-pay | Admitting: Neurology

## 2024-02-29 DIAGNOSIS — G47411 Narcolepsy with cataplexy: Secondary | ICD-10-CM

## 2024-03-03 MED ORDER — AMPHETAMINE-DEXTROAMPHETAMINE 10 MG PO TABS
ORAL_TABLET | ORAL | 0 refills | Status: DC
Start: 2024-03-03 — End: 2024-04-11

## 2024-03-03 MED ORDER — AMPHETAMINE-DEXTROAMPHETAMINE 30 MG PO TABS
30.0000 mg | ORAL_TABLET | Freq: Two times a day (BID) | ORAL | 0 refills | Status: DC
Start: 2024-03-03 — End: 2024-04-11

## 2024-03-06 NOTE — Telephone Encounter (Signed)
 Received [facility] fax shipment update.  CASE ID [id number] opt[facility] rx PA exp date 05/29/2024.  Lst shipment date 02-27-2024.  Next scheduled shipment date 03-28-2024.  Last drug shipped Sodium Oxybate  OS  7.5gm

## 2024-03-28 ENCOUNTER — Other Ambulatory Visit: Payer: Self-pay | Admitting: Family Medicine

## 2024-03-28 DIAGNOSIS — F341 Dysthymic disorder: Secondary | ICD-10-CM

## 2024-03-28 DIAGNOSIS — F411 Generalized anxiety disorder: Secondary | ICD-10-CM

## 2024-04-10 ENCOUNTER — Encounter: Payer: Self-pay | Admitting: Neurology

## 2024-04-11 ENCOUNTER — Other Ambulatory Visit: Payer: Self-pay | Admitting: *Deleted

## 2024-04-11 DIAGNOSIS — G47411 Narcolepsy with cataplexy: Secondary | ICD-10-CM

## 2024-04-11 NOTE — Telephone Encounter (Signed)
 Pt sent mychart asking for refills on her Adderall rx's. Last seen 11/19/23 and next f/u 11/20/24. Dr. Buck pt. She is out today. Will send to Dr. Chalice who is on call today.  Last refilled adderall 10mg  03/03/24 #30 and adderall 30mg  #60.

## 2024-04-12 MED ORDER — AMPHETAMINE-DEXTROAMPHETAMINE 10 MG PO TABS: ORAL_TABLET | ORAL | 0 refills | Status: DC

## 2024-04-12 MED ORDER — AMPHETAMINE-DEXTROAMPHETAMINE 30 MG PO TABS: 30.0000 mg | ORAL_TABLET | Freq: Two times a day (BID) | ORAL | 0 refills | Status: DC

## 2024-04-21 ENCOUNTER — Encounter: Payer: Self-pay | Admitting: Family Medicine

## 2024-04-21 ENCOUNTER — Ambulatory Visit: Admitting: Family Medicine

## 2024-04-21 VITALS — BP 102/80 | HR 73 | Temp 98.4°F | Resp 12 | Ht 60.5 in | Wt 129.2 lb

## 2024-04-21 DIAGNOSIS — R2232 Localized swelling, mass and lump, left upper limb: Secondary | ICD-10-CM

## 2024-04-21 DIAGNOSIS — Z23 Encounter for immunization: Secondary | ICD-10-CM

## 2024-04-21 DIAGNOSIS — Z130 Encounter for screening for diseases of the blood and blood-forming organs and certain disorders involving the immune mechanism: Secondary | ICD-10-CM | POA: Diagnosis not present

## 2024-04-21 DIAGNOSIS — Z0000 Encounter for general adult medical examination without abnormal findings: Secondary | ICD-10-CM

## 2024-04-21 DIAGNOSIS — E559 Vitamin D deficiency, unspecified: Secondary | ICD-10-CM | POA: Diagnosis not present

## 2024-04-21 DIAGNOSIS — Z13228 Encounter for screening for other metabolic disorders: Secondary | ICD-10-CM

## 2024-04-21 DIAGNOSIS — Z0001 Encounter for general adult medical examination with abnormal findings: Secondary | ICD-10-CM | POA: Diagnosis not present

## 2024-04-21 DIAGNOSIS — Z1329 Encounter for screening for other suspected endocrine disorder: Secondary | ICD-10-CM | POA: Diagnosis not present

## 2024-04-21 DIAGNOSIS — Z13220 Encounter for screening for lipoid disorders: Secondary | ICD-10-CM

## 2024-04-21 DIAGNOSIS — R251 Tremor, unspecified: Secondary | ICD-10-CM

## 2024-04-21 DIAGNOSIS — F411 Generalized anxiety disorder: Secondary | ICD-10-CM | POA: Diagnosis not present

## 2024-04-21 DIAGNOSIS — Z1159 Encounter for screening for other viral diseases: Secondary | ICD-10-CM

## 2024-04-21 DIAGNOSIS — F341 Dysthymic disorder: Secondary | ICD-10-CM

## 2024-04-21 NOTE — Assessment & Plan Note (Signed)
 We discussed the importance of regular physical activity and healthy diet for prevention of chronic illness and/or complications. Preventive guidelines reviewed. Vaccination: Prefers to hold on Shingrix. Prevnar 20 and flu vaccines given today. Continue her female preventive care with gynecologist. Next CPE in a year.

## 2024-04-21 NOTE — Assessment & Plan Note (Signed)
 Problem has been stable. Wellbutrin  XL 300 mg daily along with Adderall may be contributing to the morning shaking sensation, so recommend decreasing dose of Wellbutrin  from 300 mg to 150 mg (1/2 tab). Continue duloxetine  60 mg daily. Instructed to let me know in 8 weeks if she feels like symptoms are well controlled. We can consider continue weaning off medications as tolerated. Continue annual follow-up, before if needed.

## 2024-04-21 NOTE — Assessment & Plan Note (Signed)
 Problem is well controlled. Continue Duloxetine  60 mg daily.  Continue annual follow ups, before if needed.

## 2024-04-21 NOTE — Patient Instructions (Addendum)
 A few things to remember from today's visit:  Routine general medical examination at a health care facility - Plan: Flu vaccine trivalent PF, 6mos and older(Flulaval,Afluria,Fluarix,Fluzone)  Screening for lipoid disorders - Plan: Lipid panel, Lipid panel  Screening for endocrine, metabolic and immunity disorder - Plan: Comprehensive metabolic panel with GFR, Comprehensive metabolic panel with GFR  Vitamin D  deficiency, unspecified - Plan: VITAMIN D  25 Hydroxy (Vit-D Deficiency, Fractures), VITAMIN D  25 Hydroxy (Vit-D Deficiency, Fractures)  Encounter for HCV screening test for low risk patient - Plan: Hep C Antibody, Hep C Antibody, CANCELED: Hep C Antibody  Mass of left hand - Plan: AMB referral to orthopedics  Decrease Wellbutrin  to 1/2 tab. No changes in Duloxetine . Let me know in 8-10 weeks how you feel with changes.  If you need refills for medications you take chronically, please call your pharmacy. Do not use My Chart to request refills or for acute issues that need immediate attention. If you send a my chart message, it may take a few days to be addressed, specially if I am not in the office.  Please be sure medication list is accurate. If a new problem present, please set up appointment sooner than planned today.  Health Maintenance, Female Adopting a healthy lifestyle and getting preventive care are important in promoting health and wellness. Ask your health care provider about: The right schedule for you to have regular tests and exams. Things you can do on your own to prevent diseases and keep yourself healthy. What should I know about diet, weight, and exercise? Eat a healthy diet  Eat a diet that includes plenty of vegetables, fruits, low-fat dairy products, and lean protein. Do not eat a lot of foods that are high in solid fats, added sugars, or sodium. Maintain a healthy weight Body mass index (BMI) is used to identify weight problems. It estimates body fat based on  height and weight. Your health care provider can help determine your BMI and help you achieve or maintain a healthy weight. Get regular exercise Get regular exercise. This is one of the most important things you can do for your health. Most adults should: Exercise for at least 150 minutes each week. The exercise should increase your heart rate and make you sweat (moderate-intensity exercise). Do strengthening exercises at least twice a week. This is in addition to the moderate-intensity exercise. Spend less time sitting. Even light physical activity can be beneficial. Watch cholesterol and blood lipids Have your blood tested for lipids and cholesterol at 51 years of age, then have this test every 5 years. Have your cholesterol levels checked more often if: Your lipid or cholesterol levels are high. You are older than 51 years of age. You are at high risk for heart disease. What should I know about cancer screening? Depending on your health history and family history, you may need to have cancer screening at various ages. This may include screening for: Breast cancer. Cervical cancer. Colorectal cancer. Skin cancer. Lung cancer. What should I know about heart disease, diabetes, and high blood pressure? Blood pressure and heart disease High blood pressure causes heart disease and increases the risk of stroke. This is more likely to develop in people who have high blood pressure readings or are overweight. Have your blood pressure checked: Every 3-5 years if you are 95-70 years of age. Every year if you are 10 years old or older. Diabetes Have regular diabetes screenings. This checks your fasting blood sugar level. Have the screening done:  Once every three years after age 38 if you are at a normal weight and have a low risk for diabetes. More often and at a younger age if you are overweight or have a high risk for diabetes. What should I know about preventing infection? Hepatitis B If you  have a higher risk for hepatitis B, you should be screened for this virus. Talk with your health care provider to find out if you are at risk for hepatitis B infection. Hepatitis C Testing is recommended for: Everyone born from [birth year] through 1963. Anyone with known risk factors for hepatitis C. Sexually transmitted infections (STIs) Get screened for STIs, including gonorrhea and chlamydia, if: You are sexually active and are younger than 51 years of age. You are older than 51 years of age and your health care provider tells you that you are at risk for this type of infection. Your sexual activity has changed since you were last screened, and you are at increased risk for chlamydia or gonorrhea. Ask your health care provider if you are at risk. Ask your health care provider about whether you are at high risk for HIV. Your health care provider may recommend a prescription medicine to help prevent HIV infection. If you choose to take medicine to prevent HIV, you should first get tested for HIV. You should then be tested every 3 months for as long as you are taking the medicine. Pregnancy If you are about to stop having your period (premenopausal) and you may become pregnant, seek counseling before you get pregnant. Take 400 to 800 micrograms (mcg) of folic acid every day if you become pregnant. Ask for birth control (contraception) if you want to prevent pregnancy. Osteoporosis and menopause Osteoporosis is a disease in which the bones lose minerals and strength with aging. This can result in bone fractures. If you are 68 years old or older, or if you are at risk for osteoporosis and fractures, ask your health care provider if you should: Be screened for bone loss. Take a calcium or vitamin D  supplement to lower your risk of fractures. Be given hormone replacement therapy (HRT) to treat symptoms of menopause. Follow these instructions at home: Alcohol use Do not drink alcohol if: Your health care  provider tells you not to drink. You are pregnant, may be pregnant, or are planning to become pregnant. If you drink alcohol: Limit how much you have to: 0-1 drink a day. Know how much alcohol is in your drink. In the [place] one drink equals one 12 oz bottle of beer (355 mL), one 5 oz glass of wine (148 mL), or one 1 oz glass of hard liquor (44 mL). Lifestyle Do not use any products that contain nicotine or tobacco. These products include cigarettes, chewing tobacco, and vaping devices, such as e-cigarettes. If you need help quitting, ask your health care provider. Do not use street drugs. Do not share needles. Ask your health care provider for help if you need support or information about quitting drugs. General instructions Schedule regular health, dental, and eye exams. Stay current with your vaccines. Tell your health care provider if: You often feel depressed. You have ever been abused or do not feel safe at home. Summary Adopting a healthy lifestyle and getting preventive care are important in promoting health and wellness. Follow your health care provider's instructions about healthy diet, exercising, and getting tested or screened for diseases. Follow your health care provider's instructions on monitoring your cholesterol and blood pressure. This information  is not intended to replace advice given to you by your health care provider. Make sure you discuss any questions you have with your health care provider. Document Revised: 12/20/2020 Document Reviewed: 12/20/2020 Elsevier Patient Education  2024 [employer].

## 2024-04-21 NOTE — Progress Notes (Unsigned)
 Chief Complaint  Patient presents with   Annual Exam   Follow-up   Discussed the use of AI scribe software for clinical note transcription with the patient, who gave verbal consent to proceed.  History of Present Illness Cynthia [name] is a 53 year old female who presents for an annual physical exam and follow up. Last CPE 03/16/23. She was seen on 10/23/23 for cough, which has resolved.  Since her last visit she has followed with neurologist for narcolepsy with cataplexy. She has also seen her gynecologist.  She is also current with her eye and dental care, having visited the eye doctor in June and the dentist in August.  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  Her exercise routine includes walking one to two times a week, and she is trying to improve her physical activity.  Her diet consists of a mix of home-cooked meals and eating out due to a broken refrigerator, but she ensures daily vegetable intake.  She sleeps eight to nine hours per night.  Immunization History  Administered Date(s) Administered   Influenza Inj Mdck Quad Pf 04/23/2019   Influenza Split 05/23/2012   Influenza, Seasonal, Injecte, Preservative Fre 04/21/2024   Influenza,inj,Quad PF,6+ Mos 05/02/2013, 04/22/2017, 04/24/2020, 06/08/2021   Influenza-Unspecified 05/02/2017   PNEUMOCOCCAL CONJUGATE-20 04/21/2024   Pfizer Covid-19 Vaccine Bivalent Booster 58yrs & up 06/17/2021   Tdap 07/11/2013, 03/16/2023   Health Maintenance  Topic Date Due   Hepatitis C Screening  Never done   Hepatitis B Vaccines 19-59 Average Risk (1 of 3 - 19+ 3-dose series) Never done   Zoster Vaccines- Shingrix (1 of 2) Never done   COVID-19 Vaccine (2 - 2025-26 season) 05/07/2024 (Originally 04/14/2024)   Cervical Cancer Screening (HPV/Pap Cotest)  07/21/2024 (Originally 07/16/2003)   MAMMOGRAM  10/19/2024 (Originally 07/16/2023)   HIV Screening  12/09/2024 (Originally 07/15/1988)   Colonoscopy   06/30/2032   DTaP/Tdap/Td (3 - Td or Tdap) 03/15/2033   Pneumococcal Vaccine: 50+ Years  Completed   Influenza Vaccine  Completed   HPV VACCINES  Aged Out   Meningococcal B Vaccine  Aged Out   She is currently on Aderall 10 mg 30 mg bid and Xyrem  7.9 g at night.  Depression on Duloxetine  60 mg daily and Wellbutrin  XR 300 mg daily. She reports experiencing morning shakiness for a few months, thinks may be caused by some of her medications. Negative for fever, abnormal wt loss.    04/21/2024    4:14 PM 08/17/2023    3:04 PM 03/16/2023    4:38 PM 08/09/2022    1:47 PM 05/10/2022   11:32 AM  Depression screen PHQ 2/9  Decreased Interest 0 0 0 0 0  Down, Depressed, Hopeless 0 0 0 0 0  PHQ - 2 Score 0 0 0 0 0  Altered sleeping 0  0 0 0  Tired, decreased energy 0  0 0 0  Change in appetite 0 0 0 0 0  Feeling bad or failure about yourself  0 0 0 0 0  Trouble concentrating 0 0 0 0 0  Moving slowly or fidgety/restless 0 0 0 0 0  Suicidal thoughts 0 0 0 0 0  PHQ-9 Score 0  0 0 0  Difficult doing work/chores Not difficult at all  Not difficult at all Not difficult at all Not difficult at all      08/17/2023    3:04 PM 03/16/2023  4:38 PM 06/08/2021   10:36 PM 12/10/2019    7:20 AM  GAD 7 : Generalized Anxiety Score  Nervous, Anxious, on Edge 0 0 0 1  Control/stop worrying 0 0 0 0  Worry too much - different things 0 0 0 1  Trouble relaxing 0 0 0 0  Restless 0 0 0 0  Easily annoyed or irritable 0 0 1 1  Afraid - awful might happen 0 0 0 0  Total GAD 7 Score 0 0 1 3  Anxiety Difficulty  Not difficult at all Not difficult at all Not difficult at all   She has a persistent knot on her left hand at the fifth metacarpophalangeal joint, present for a long time. It causes pain with extensive hand use such as coloring on her iPad, but there is no limitation of movement or clicking sensation. No hx of trauma. Left handed.  Vit D deficiency: She did not start vit D supplementation. Lab Results   Component Value Date   VD25OH 28.53 (L) 10/02/2017   Review of Systems  Constitutional:  Negative for activity change, appetite change and fever.  HENT:  Negative for mouth sores, sore throat and trouble swallowing.   Eyes:  Negative for redness and visual disturbance.  Respiratory:  Negative for cough, shortness of breath and wheezing.   Cardiovascular:  Negative for chest pain and leg swelling.  Gastrointestinal:  Negative for abdominal pain, nausea and vomiting.  Endocrine: Negative for cold intolerance, heat intolerance, polydipsia, polyphagia and polyuria.  Genitourinary:  Negative for decreased urine volume, dysuria and hematuria.  Musculoskeletal:  Positive for arthralgias. Negative for gait problem and myalgias.  Skin:  Negative for color change and rash.  Allergic/Immunologic: Negative for environmental allergies.  Neurological:  Negative for syncope, weakness and headaches.  Hematological:  Negative for adenopathy. Does not bruise/bleed easily.  Psychiatric/Behavioral:  Negative for confusion and hallucinations.   All other systems reviewed and are negative.  Current Outpatient Medications on File Prior to Visit  Medication Sig Dispense Refill   amphetamine -dextroamphetamine  (ADDERALL) 10 MG tablet Daily at 11 AM. Along with 30 mg bid, separate Rx 30 tablet 0   amphetamine -dextroamphetamine  (ADDERALL) 30 MG tablet Take 1 tablet by mouth 2 (two) times daily with a meal. 60 tablet 0   buPROPion  (WELLBUTRIN  XL) 300 MG 24 hr tablet TAKE 1 TABLET BY MOUTH EVERY DAY 30 tablet 3   Sodium Oxybate  (XYREM ) 500 MG/ML SOLN Take 3.75gms for 1st nightly dose at bedtime and 3.75 gms for 2nd nightly dose 2.5-4 hours later.  (Total dose 7.5grams nightly) 450 mL 5   DULoxetine  (CYMBALTA ) 60 MG capsule TAKE 1 CAPSULE BY MOUTH EVERY DAY 30 capsule 3   No current facility-administered medications on file prior to visit.   Past Medical History:  Diagnosis Date   AC separation 09/2014   left  shoulder   Anxiety    Carpal tunnel syndrome of right wrist    Complication of anesthesia    Dental crowns present    GERD (gastroesophageal reflux disease)    Gaviscon as needed   Migraines    Narcolepsy    PONV (postoperative nausea and vomiting)    nausea   Past Surgical History:  Procedure Laterality Date   ACROMIO-CLAVICULAR JOINT REPAIR Left 10/12/2014   Procedure: Left shoulder acromio-clavicular joint repair;  Surgeon: Eva Elsie Herring, MD;  Location: [facility];  Service: Orthopedics;  Laterality: Left;  Left shoulder acromio-clavicular joint repair   BUNIONECTOMY Bilateral  CESAREAN SECTION  10/14/2005; 08/23/2007   CYSTOSCOPY  07/26/2009   ENDOMETRIAL ABLATION  11/14/2002   EUS N/A 01/22/2014   Procedure: UPPER ENDOSCOPIC ULTRASOUND (EUS) LINEAR;  Surgeon: Toribio SHAUNNA Cedar, MD;  Location: [facility];  Service: Endoscopy;  Laterality: N/A;   LAPAROSCOPIC LYSIS OF ADHESIONS  04/30/2009   LAPAROSCOPIC OVARIAN CYSTECTOMY Left 11/14/2002   LAPAROSCOPIC SUPRACERVICAL HYSTERECTOMY  07/26/2009   LAPAROSCOPIC UNILATERAL SALPINGO OOPHERECTOMY Right 07/26/2009   TUBAL LIGATION  08/23/2007    Allergies  Allergen Reactions   Meclizine Hcl Other (See Comments)    SEVERE HEARTBURN   Morphine Itching   Percocet [Oxycodone -Acetaminophen ] Itching    Family History  Problem Relation Age of Onset   Narcolepsy Sister    Kidney disease Sister    Prostate cancer Maternal Grandfather    Heart disease Paternal Grandfather     Social History   Socioeconomic History   Marital status: Married    Spouse name: [name]   Number of children: 2   Years of education: Bachelors   Highest education level: Bachelor's degree (e.g., BA, AB, BS)  Occupational History   Occupation: Child psychotherapist    Employer: [employer]  Tobacco Use   Smoking status: Former    Types: Cigarettes    Quit date: 08/13/2005    Years since quitting: 18.7   Smokeless tobacco: Never  Substance  and Sexual Activity   Alcohol use: No    Alcohol/week: 0.0 standard drinks of alcohol   Drug use: No   Sexual activity: Not on file  Other Topics Concern   Not on file  Social History Narrative   Pt lives at home with husband Albina helane Dunnings) and two children   Pt is left handed    Education - Bachelors degree   Caffeine 1 cup a day   Social Drivers of Corporate investment banker Strain: Low Risk  (04/20/2024)   Overall Financial Resource Strain (CARDIA)    Difficulty of Paying Living Expenses: Not hard at all  Food Insecurity: No Food Insecurity (04/20/2024)   Hunger Vital Sign    Worried About Running Out of Food in the Last Year: Never true    Ran Out of Food in the Last Year: Never true  Transportation Needs: No Transportation Needs (04/20/2024)   PRAPARE - Administrator, Civil Service (Medical): No    Lack of Transportation (Non-Medical): No  Physical Activity: Insufficiently Active (04/20/2024)   Exercise Vital Sign    Days of Exercise per Week: 2 days    Minutes of Exercise per Session: 30 min  Stress: No Stress Concern Present (04/20/2024)   [employer] of Occupational Health - Occupational Stress Questionnaire    Feeling of Stress: Not at all  Social Connections: Unknown (04/20/2024)   Social Connection and Isolation Panel    Frequency of Communication with Friends and Family: Once a week    Frequency of Social Gatherings with Friends and Family: Patient declined    Attends Religious Services: More than 4 times per year    Active Member of [employer] or Organizations: No    Attends Banker Meetings: Not on file    Marital Status: Married   Vitals:   04/21/24 1620  BP: 102/80  Pulse: 73  Resp: 12  Temp: 98.4 F (36.9 C)  SpO2: 98%   Body mass index is 24.82 kg/m.  Wt Readings from Last 3 Encounters:  04/21/24 129 lb 3.2 oz (58.6 kg)  11/19/23  141 lb 3.2 oz (64 kg)  10/23/23 137 lb (62.1 kg)   Physical Exam Vitals and nursing note  reviewed.  Constitutional:      General: She is not in acute distress.    Appearance: She is well-developed.  HENT:     Head: Normocephalic and atraumatic.     Right Ear: Tympanic membrane, ear canal and external ear normal.     Left Ear: Tympanic membrane, ear canal and external ear normal.     Mouth/Throat:     Mouth: Mucous membranes are moist.     Pharynx: Oropharynx is clear. Uvula midline.  Eyes:     Extraocular Movements: Extraocular movements intact.     Conjunctiva/sclera: Conjunctivae normal.     Pupils: Pupils are equal, round, and reactive to light.  Neck:     Thyroid : No thyroid  mass or thyromegaly.  Cardiovascular:     Rate and Rhythm: Normal rate and regular rhythm.     Pulses:          Dorsalis pedis pulses are 2+ on the right side and 2+ on the left side.     Heart sounds: No murmur heard. Pulmonary:     Effort: Pulmonary effort is normal. No respiratory distress.     Breath sounds: Normal breath sounds.  Abdominal:     Palpations: Abdomen is soft. There is no hepatomegaly or mass.     Tenderness: There is no abdominal tenderness.  Genitourinary:    Comments: Deferred to gyn. Musculoskeletal:     Left hand: No bony tenderness. Normal range of motion. Normal capillary refill.       Hands:     Right lower leg: No edema.     Left lower leg: No edema.     Comments: No signs of synovitis appreciated.   Lymphadenopathy:     Cervical: No cervical adenopathy.     Upper Body:     Right upper body: No supraclavicular adenopathy.     Left upper body: No supraclavicular adenopathy.  Skin:    General: Skin is warm.     Findings: No erythema or rash.  Neurological:     General: No focal deficit present.     Mental Status: She is alert and oriented to person, place, and time.     Cranial Nerves: No cranial nerve deficit.     Motor: No tremor.     Coordination: Coordination normal.     Gait: Gait normal.     Deep Tendon Reflexes:     Reflex Scores:      Bicep  reflexes are 2+ on the right side and 2+ on the left side.      Patellar reflexes are 2+ on the right side and 2+ on the left side. Psychiatric:        Mood and Affect: Mood and affect normal.    ASSESSMENT AND PLAN: Ms. Cynthia [name] was here today annual physical examination.  Orders Placed This Encounter  Procedures   Flu vaccine trivalent PF, 6mos and older(Flulaval,Afluria,Fluarix,Fluzone)   Pneumococcal conjugate vaccine 20-valent (Prevnar 20)   Comprehensive metabolic panel with GFR   Lipid panel   VITAMIN D  25 Hydroxy (Vit-D Deficiency, Fractures)   Hep C Antibody   TSH   AMB referral to orthopedics   Lab Results  Component Value Date   TSH 2.19 04/22/2024   Lab Results  Component Value Date   CHOL 210 (H) 04/21/2024   HDL 64.80 04/21/2024   LDLCALC 116 (H)  04/21/2024   TRIG 145.0 04/21/2024   CHOLHDL 3 04/21/2024   Lab Results  Component Value Date   VD25OH 32.97 04/21/2024   Lab Results  Component Value Date   NA 137 04/21/2024   CL 103 04/21/2024   K 4.6 04/21/2024   CO2 29 04/21/2024   BUN 12 04/21/2024   CREATININE 0.69 04/21/2024   GFR 100.95 04/21/2024   CALCIUM 9.9 04/21/2024   ALBUMIN 4.0 04/21/2024   GLUCOSE 72 04/21/2024   Lab Results  Component Value Date   ALT 25 04/21/2024   AST 21 04/21/2024   ALKPHOS 54 04/21/2024   BILITOT 0.3 04/21/2024   The 10-year ASCVD risk score (Arnett DK, et al., 2019) is: 0.7%   Values used to calculate the score:     Age: 53 years     Clincally relevant sex: Female     Is Non-Hispanic African American: No     Diabetic: No     Tobacco smoker: No     Systolic Blood Pressure: 102 mmHg     Is BP treated: No     HDL Cholesterol: 64.8 mg/dL     Total Cholesterol: 210 mg/dL  Routine general medical examination at a health care facility Assessment & Plan: We discussed the importance of regular physical activity and healthy diet for prevention of chronic illness and/or complications. Preventive  guidelines reviewed. Vaccination: Prefers to hold on Shingrix. Prevnar 20 and flu vaccines given today. Continue her female preventive care with gynecologist. Next CPE in a year.  Screening for lipoid disorders -     Lipid panel; Future  Screening for endocrine, metabolic and immunity disorder -     Comprehensive metabolic panel with GFR; Future  Vitamin D  deficiency, unspecified Assessment & Plan: She is not on vit D supplementation. Further recommendations will be given according to 25 OH vit D result.  Orders: -     VITAMIN D  25 Hydroxy (Vit-D Deficiency, Fractures); Future  Encounter for HCV screening test for low risk patient -     Hepatitis C antibody; Future  Mass of left hand Benign most likely. It is causing pain with repetitive hand movement.  Ortho referral placed.  -     Ambulatory referral to Orthopedics  Generalized anxiety disorder Assessment & Plan: Problem is well controlled. Continue Duloxetine  60 mg daily.  Continue annual follow ups, before if needed.  Dysthymic disorder Assessment & Plan: Problem has been stable. Wellbutrin  XL 300 mg daily along with Adderall may be contributing to the morning shaking sensation, so recommend decreasing dose of Wellbutrin  from 300 mg to 150 mg (1/2 tab). Continue duloxetine  60 mg daily. Instructed to let me know in 8 weeks if she feels like symptoms are well controlled. We can consider continue weaning off medications as tolerated. Continue annual follow-up, before if needed.  Tremor In the morning for a few months. We discussed possible etiologies, could be related to some of her medications. Wellbutrin  dose decreased from 300 mg to 150 mg daily. Blood drown done before starting visit, we will try to add TSH.  -     TSH; Future  Need for pneumococcal vaccination -     Pneumococcal conjugate vaccine 20-valent  Flu vaccine need -     Flu vaccine trivalent PF, 6mos and  older(Flulaval,Afluria,Fluarix,Fluzone)  Return in 1 year (on 04/21/2025) for CPE, chronic problems.  Clayton Jarmon G. [place], MD  [facility]. [place] office.

## 2024-04-21 NOTE — Assessment & Plan Note (Signed)
She is not on vit D supplementation. Further recommendations will be given according to 25 OH vit D result.

## 2024-04-22 ENCOUNTER — Ambulatory Visit (INDEPENDENT_AMBULATORY_CARE_PROVIDER_SITE_OTHER)

## 2024-04-22 DIAGNOSIS — R251 Tremor, unspecified: Secondary | ICD-10-CM

## 2024-04-22 LAB — COMPREHENSIVE METABOLIC PANEL WITH GFR
ALT: 25 U/L (ref 0–35)
AST: 21 U/L (ref 0–37)
Albumin: 4 g/dL (ref 3.5–5.2)
Alkaline Phosphatase: 54 U/L (ref 39–117)
BUN: 12 mg/dL (ref 6–23)
CO2: 29 meq/L (ref 19–32)
Calcium: 9.9 mg/dL (ref 8.4–10.5)
Chloride: 103 meq/L (ref 96–112)
Creatinine, Ser: 0.69 mg/dL (ref 0.40–1.20)
GFR: 100.95 mL/min (ref >60.00)
Glucose, Bld: 72 mg/dL (ref 70–99)
Potassium: 4.6 meq/L (ref 3.5–5.1)
Sodium: 137 meq/L (ref 135–145)
Total Bilirubin: 0.3 mg/dL (ref 0.2–1.2)
Total Protein: 7.2 g/dL (ref 6.0–8.3)

## 2024-04-22 LAB — HEPATITIS C ANTIBODY: Hepatitis C Ab: NONREACTIVE

## 2024-04-22 LAB — LIPID PANEL
Cholesterol: 210 mg/dL — ABNORMAL HIGH (ref 0–200)
HDL: 64.8 mg/dL (ref >39.00)
LDL Cholesterol: 116 mg/dL — ABNORMAL HIGH (ref 0–99)
NonHDL: 145.16
Total CHOL/HDL Ratio: 3
Triglycerides: 145 mg/dL (ref 0.0–149.0)
VLDL: 29 mg/dL (ref 0.0–40.0)

## 2024-04-22 LAB — VITAMIN D 25 HYDROXY (VIT D DEFICIENCY, FRACTURES): VITD: 32.97 ng/mL (ref 30.00–100.00)

## 2024-04-22 LAB — TSH: TSH: 2.19 u[IU]/mL (ref 0.35–5.50)

## 2024-04-22 NOTE — Telephone Encounter (Signed)
 Received [facility] fax shipment update.  CASE ID [id number] PA exp date 05/29/2024  , last shipment date 03/28/2024.  Next date 04-27-2024  sodium oxybate .  Total 7.5gms.

## 2024-04-23 ENCOUNTER — Encounter: Payer: Self-pay | Admitting: Family Medicine

## 2024-04-24 ENCOUNTER — Ambulatory Visit: Payer: Self-pay | Admitting: Family Medicine

## 2024-04-25 ENCOUNTER — Encounter: Payer: Self-pay | Admitting: Family Medicine

## 2024-05-05 NOTE — Telephone Encounter (Signed)
 Received [facility] fax shipment update. CASE ID [id number] PA exp date 05/29/2024 , last shipment date 04/28/2024. Next date 05/27/2024 sodium oxybate . Total 7.5 gms.

## 2024-05-07 ENCOUNTER — Telehealth: Payer: Self-pay | Admitting: *Deleted

## 2024-05-07 NOTE — Telephone Encounter (Signed)
 Destiny from [facility] is asking to speak with a RN re;  Sodium Oxybate  (XYREM ) 500 MG/ML SOLN.  I spoke to her, she relayed that pts PA renewal expires 05/29/2024 and they noted that pts cover my meds key was archived.  She was reaching out on this.  I told her I dont see any documentation relating to a PA being attempted recently.  I told her I will send message to PA team to address.  She will follow up if needed.   Appreciated taking the call.

## 2024-05-08 ENCOUNTER — Other Ambulatory Visit (HOSPITAL_COMMUNITY): Payer: Self-pay

## 2024-05-08 NOTE — Telephone Encounter (Signed)
 Pharmacy Patient Advocate Encounter   Received notification from Physician's Office that prior authorization for Sodium Oxybate  is required/requested.   Insurance verification completed.   The patient is insured through [facility] .   Per test claim: PA required; PA submitted to above mentioned insurance via Latent Key/confirmation #/EOC [id number] Status is pending

## 2024-05-12 ENCOUNTER — Encounter: Payer: Self-pay | Admitting: Neurology

## 2024-05-12 ENCOUNTER — Other Ambulatory Visit (HOSPITAL_COMMUNITY): Payer: Self-pay

## 2024-05-12 DIAGNOSIS — G47411 Narcolepsy with cataplexy: Secondary | ICD-10-CM

## 2024-05-12 MED ORDER — AMPHETAMINE-DEXTROAMPHETAMINE 10 MG PO TABS: ORAL_TABLET | ORAL | 0 refills | Status: DC

## 2024-05-12 MED ORDER — AMPHETAMINE-DEXTROAMPHETAMINE 30 MG PO TABS: 30.0000 mg | ORAL_TABLET | Freq: Two times a day (BID) | ORAL | 0 refills | Status: DC

## 2024-05-12 NOTE — Telephone Encounter (Signed)
 Pharmacy Patient Advocate Encounter  Received notification from [facility] that Prior Authorization for Sodium Oxybate  has been APPROVED from 05/09/2024 to 05/08/2025. Unable to obtain price due to refill too soon rejection, last fill date 04/28/2024 next available fill date10/07/2024   PA #/Case ID/Reference #: [id number]

## 2024-05-12 NOTE — Telephone Encounter (Signed)
 Last visit: 11/19/23  Next visit: 11/20/24  Per epic adherence report, last fill:   Rx refills sent to Dr Buck to [facility]

## 2024-05-12 NOTE — Telephone Encounter (Signed)
 Spoke with Joann at [employer] and provided PA approval information requested.

## 2024-06-23 ENCOUNTER — Other Ambulatory Visit: Payer: Self-pay | Admitting: *Deleted

## 2024-06-23 ENCOUNTER — Encounter: Payer: Self-pay | Admitting: Neurology

## 2024-06-23 DIAGNOSIS — G47411 Narcolepsy with cataplexy: Secondary | ICD-10-CM

## 2024-06-23 MED ORDER — AMPHETAMINE-DEXTROAMPHETAMINE 30 MG PO TABS: 30.0000 mg | ORAL_TABLET | Freq: Two times a day (BID) | ORAL | 0 refills | Status: DC

## 2024-06-23 MED ORDER — AMPHETAMINE-DEXTROAMPHETAMINE 10 MG PO TABS: ORAL_TABLET | ORAL | 0 refills | Status: DC

## 2024-06-23 NOTE — Telephone Encounter (Signed)
 Sent refill request to provider this pm

## 2024-07-08 ENCOUNTER — Encounter: Payer: Self-pay | Admitting: Family Medicine

## 2024-07-24 ENCOUNTER — Other Ambulatory Visit: Payer: Self-pay | Admitting: Family Medicine

## 2024-07-24 DIAGNOSIS — F341 Dysthymic disorder: Secondary | ICD-10-CM

## 2024-07-24 DIAGNOSIS — F411 Generalized anxiety disorder: Secondary | ICD-10-CM

## 2024-07-28 ENCOUNTER — Encounter: Payer: Self-pay | Admitting: Family Medicine

## 2024-07-28 NOTE — Telephone Encounter (Signed)
 Wellbutrin  was decreased last visit from 300 mg to 150 mg daily, 1/2 tablet of her 300 mg tablets. Thanks, BJ

## 2024-07-30 ENCOUNTER — Other Ambulatory Visit: Payer: Self-pay

## 2024-07-30 DIAGNOSIS — F341 Dysthymic disorder: Secondary | ICD-10-CM

## 2024-07-30 DIAGNOSIS — F411 Generalized anxiety disorder: Secondary | ICD-10-CM

## 2024-07-30 MED ORDER — SODIUM OXYBATE 500 MG/ML PO SOLN: ORAL | 5 refills | Status: AC

## 2024-07-30 NOTE — Telephone Encounter (Signed)
 Last refilled by patient on : 07/07/2024 Last office visit : 11/19/23 Next office visit : 11/20/24 Sodium Oxybate  has been APPROVED from 05/09/2024 to 05/08/2025.

## 2024-07-31 NOTE — Telephone Encounter (Signed)
 Printed order [facility]

## 2024-08-05 NOTE — Telephone Encounter (Signed)
 Prescription form faxed to [facility] pharmacy. Received a receipt of confirmation.  Fax # [phone]

## 2024-08-05 NOTE — Telephone Encounter (Signed)
 [facility] pharmacy needs the prescription form completed for prescription processing. I have the form in Dr Obie office ready for signature.

## 2024-08-11 ENCOUNTER — Other Ambulatory Visit: Payer: Self-pay

## 2024-08-11 ENCOUNTER — Encounter: Payer: Self-pay | Admitting: Neurology

## 2024-08-11 DIAGNOSIS — G47411 Narcolepsy with cataplexy: Secondary | ICD-10-CM

## 2024-08-11 MED ORDER — AMPHETAMINE-DEXTROAMPHETAMINE 30 MG PO TABS: 30.0000 mg | ORAL_TABLET | Freq: Two times a day (BID) | ORAL | 0 refills | Status: DC

## 2024-08-11 MED ORDER — AMPHETAMINE-DEXTROAMPHETAMINE 10 MG PO TABS: ORAL_TABLET | ORAL | 0 refills | Status: DC

## 2024-08-11 MED ORDER — BUPROPION HCL ER (XL) 150 MG PO TB24: 150.0000 mg | ORAL_TABLET | Freq: Every day | ORAL | 1 refills | Status: AC

## 2024-08-11 NOTE — Telephone Encounter (Signed)
 Last OV 11-19-2023, next appt 11-20-2024 Last filled for both adderall 30mg  #60 and 10mg  #30 tabs .

## 2024-08-25 ENCOUNTER — Telehealth: Payer: Self-pay

## 2024-08-25 NOTE — Telephone Encounter (Signed)
 [facility] sent notification that XYREM  and sodium Oxybate  solution has been shipped out to the patient on 08/25/24. Next scheduled shipment  is 09/03/24.

## 2024-09-16 ENCOUNTER — Encounter: Payer: Self-pay | Admitting: Neurology

## 2024-09-16 DIAGNOSIS — G47411 Narcolepsy with cataplexy: Secondary | ICD-10-CM

## 2024-09-17 MED ORDER — AMPHETAMINE-DEXTROAMPHETAMINE 30 MG PO TABS: 30.0000 mg | ORAL_TABLET | Freq: Two times a day (BID) | ORAL | 0 refills | Status: AC

## 2024-09-17 MED ORDER — AMPHETAMINE-DEXTROAMPHETAMINE 10 MG PO TABS: ORAL_TABLET | ORAL | 0 refills | Status: AC

## 2024-11-19 ENCOUNTER — Ambulatory Visit: Admitting: Neurology

## 2024-11-20 ENCOUNTER — Ambulatory Visit: Admitting: Neurology
